# Patient Record
Sex: Female | Born: 1995 | Race: Black or African American | Hispanic: No | Marital: Single | Smoking: Never smoker
Health system: Southern US, Community
[De-identification: ages and names within clinical notes are randomized; demographics above are authoritative.]

## PROBLEM LIST (undated history)

## (undated) ENCOUNTER — Inpatient Hospital Stay (HOSPITAL_COMMUNITY): Payer: Self-pay

## (undated) DIAGNOSIS — O24419 Gestational diabetes mellitus in pregnancy, unspecified control: Secondary | ICD-10-CM

## (undated) DIAGNOSIS — R51 Headache: Secondary | ICD-10-CM

## (undated) DIAGNOSIS — O1410 Severe pre-eclampsia, unspecified trimester: Secondary | ICD-10-CM

## (undated) DIAGNOSIS — Z789 Other specified health status: Secondary | ICD-10-CM

## (undated) DIAGNOSIS — N390 Urinary tract infection, site not specified: Secondary | ICD-10-CM

## (undated) DIAGNOSIS — A539 Syphilis, unspecified: Secondary | ICD-10-CM

## (undated) DIAGNOSIS — R519 Headache, unspecified: Secondary | ICD-10-CM

## (undated) DIAGNOSIS — I10 Essential (primary) hypertension: Secondary | ICD-10-CM

## (undated) DIAGNOSIS — O139 Gestational [pregnancy-induced] hypertension without significant proteinuria, unspecified trimester: Secondary | ICD-10-CM

## (undated) HISTORY — DX: Severe pre-eclampsia, unspecified trimester: O14.10

## (undated) HISTORY — DX: Other specified health status: Z78.9

## (undated) HISTORY — PX: TONSILLECTOMY: SUR1361

---
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
## 2017-01-03 ENCOUNTER — Encounter (HOSPITAL_COMMUNITY): Payer: Self-pay | Admitting: *Deleted

## 2017-01-03 ENCOUNTER — Inpatient Hospital Stay (HOSPITAL_COMMUNITY)
Admission: AD | Admit: 2017-01-03 | Discharge: 2017-01-03 | Disposition: A | Discharge status: Home / Self Care | Payer: Medicaid Other | Source: Ambulatory Visit | Attending: Family Medicine | Admitting: Family Medicine

## 2017-01-03 DIAGNOSIS — O26891 Other specified pregnancy related conditions, first trimester: Secondary | ICD-10-CM | POA: Insufficient documentation

## 2017-01-03 DIAGNOSIS — O26899 Other specified pregnancy related conditions, unspecified trimester: Secondary | ICD-10-CM

## 2017-01-03 DIAGNOSIS — O26892 Other specified pregnancy related conditions, second trimester: Secondary | ICD-10-CM | POA: Diagnosis not present

## 2017-01-03 DIAGNOSIS — R102 Pelvic and perineal pain: Secondary | ICD-10-CM

## 2017-01-03 DIAGNOSIS — Z9889 Other specified postprocedural states: Secondary | ICD-10-CM | POA: Insufficient documentation

## 2017-01-03 DIAGNOSIS — R51 Headache: Secondary | ICD-10-CM | POA: Diagnosis not present

## 2017-01-03 DIAGNOSIS — Z79899 Other long term (current) drug therapy: Secondary | ICD-10-CM | POA: Insufficient documentation

## 2017-01-03 DIAGNOSIS — R1030 Lower abdominal pain, unspecified: Secondary | ICD-10-CM | POA: Diagnosis not present

## 2017-01-03 DIAGNOSIS — Z3491 Encounter for supervision of normal pregnancy, unspecified, first trimester: Secondary | ICD-10-CM

## 2017-01-03 DIAGNOSIS — Z3A13 13 weeks gestation of pregnancy: Secondary | ICD-10-CM | POA: Insufficient documentation

## 2017-01-03 LAB — URINALYSIS, ROUTINE W REFLEX MICROSCOPIC
BILIRUBIN URINE: NEGATIVE
GLUCOSE, UA: NEGATIVE mg/dL
Hgb urine dipstick: NEGATIVE
Ketones, ur: 20 mg/dL — AB
NITRITE: NEGATIVE
PH: 6 (ref 5.0–8.0)
Protein, ur: NEGATIVE mg/dL
SPECIFIC GRAVITY, URINE: 1.01 (ref 1.005–1.030)

## 2017-01-03 MED ORDER — TERCONAZOLE 0.8 % VA CREA: 1.0000 | TOPICAL_CREAM | Freq: Every day | VAGINAL | 0 refills | Status: DC

## 2017-01-03 NOTE — Discharge Instructions (Signed)

## 2017-01-03 NOTE — MAU Note (Signed)
Having migraines (off and on for a month) and sharp pains on LLQ.  Has been off and on for a wk.   +HPT about 2 months ago.

## 2017-01-03 NOTE — MAU Provider Note (Signed)
History     CSN: [id number]  Arrival date and time: 01/03/17 1329  First Provider Initiated Contact with Patient 01/03/17 1610      Chief Complaint  Patient presents with  . Headache  . Abdominal Pain   HPI Alexis Blanchard is a 21 y.o. G2P0010 at [redacted]w[redacted]d who presents with abdominal pain & headache. Has initial prenatal visit scheduled for next month.  Reports increase in headache since finding out she was pregnant. Current headache since this morning. Has not treated. Rates pain 2/10. States she normally takes tylenol for headaches & they resolve. Denies CP, SOB, n/v/d, or photophobia.  Lower abdominal pain x 1 week. Pain is bilateral and occurs with position changes only. Rates pain 2/10 when it occurs. Denies dysuria, vaginal bleeding, or vaginal discharge.  OB History    Gravida Para Term Preterm AB Living   2 0 0 0 1 0   SAB TAB Ectopic Multiple Live Births   1 0 0 0 0      History reviewed. No pertinent past medical history.  Past Surgical History:  Procedure Laterality Date  . TONSILLECTOMY      History reviewed. No pertinent family history.  Social History  Substance Use Topics  . Smoking status: Never Smoker  . Smokeless tobacco: Never Used  . Alcohol use Yes     Comment: socially    Allergies: No Known Allergies  Prescriptions Prior to Admission  Medication Sig Dispense Refill Last Dose  . Prenatal Vit-Fe Fumarate-FA (PRENATAL MULTIVITAMIN) TABS tablet Take 1 tablet by mouth daily at 12 noon.   08/21/2016 at Unknown time    Review of Systems  Constitutional: Negative.   Eyes: Negative for photophobia.  Respiratory: Negative for shortness of breath.   Cardiovascular: Negative for chest pain.  Gastrointestinal: Positive for abdominal pain. Negative for constipation, diarrhea, nausea and vomiting.  Genitourinary: Negative.   Neurological: Positive for headaches. Negative for dizziness, syncope and light-headedness.   Physical Exam   Blood pressure  120/79, pulse 88, temperature 98.9 F (37.2 C), temperature source Oral, resp. rate 16, weight 107 lb 4 oz (48.6 kg), last menstrual period 09/28/2016, SpO2 100 %, unknown if currently breastfeeding.  Physical Exam  Nursing note and vitals reviewed. Constitutional: She is oriented to person, place, and time. She appears well-developed and well-nourished. No distress.  HENT:  Head: Normocephalic and atraumatic.  Eyes: Conjunctivae are normal. Right eye exhibits no discharge. Left eye exhibits no discharge. No scleral icterus.  Neck: Normal range of motion.  Cardiovascular: Normal rate, regular rhythm and normal heart sounds.   No murmur heard. Respiratory: Effort normal and breath sounds normal. No respiratory distress. She has no wheezes.  GI: Soft. Bowel sounds are normal. There is no tenderness.  Genitourinary:  Genitourinary Comments: Cervix closed/thick  Neurological: She is alert and oriented to person, place, and time.  Skin: Skin is warm and dry. She is not diaphoretic.  Psychiatric: She has a normal mood and affect. Her behavior is normal. Judgment and thought content normal.    MAU Course  Procedures Results for orders placed or performed during the hospital encounter of 01/03/17 (from the past 24 hour(s))  Urinalysis, Routine w reflex microscopic     Status: Abnormal   Collection Time: 01/03/17  1:50 PM  Result Value Ref Range   Color, Urine YELLOW YELLOW   APPearance HAZY (A) CLEAR   Specific Gravity, Urine 1.010 1.005 - 1.030   pH 6.0 5.0 - 8.0   Glucose,  UA NEGATIVE NEGATIVE mg/dL   Hgb urine dipstick NEGATIVE NEGATIVE   Bilirubin Urine NEGATIVE NEGATIVE   Ketones, ur 20 (A) NEGATIVE mg/dL   Protein, ur NEGATIVE NEGATIVE mg/dL   Nitrite NEGATIVE NEGATIVE   Leukocytes, UA MODERATE (A) NEGATIVE   RBC / HPF 0-5 0 - 5 RBC/hpf   WBC, UA 6-30 0 - 5 WBC/hpf   Bacteria, UA RARE (A) NONE SEEN   Squamous Epithelial / LPF 6-30 (A) NONE SEEN   Mucus PRESENT    Budding  Yeast PRESENT     MDM FHT 165 VSS, NAD Cervix closed Will tx for yeast based on u/a micro Assessment and Plan  A: 1. Pain of round ligament affecting pregnancy, antepartum   2. Fetal heart tones present, first trimester    P: Discharge home Rx terazol Take tylenol prn headache Keep scheduled prenatal appt Discussed reasons to return to MAU  Judeth Horn 01/03/2017, 4:09 PM

## 2017-01-15 DIAGNOSIS — O0990 Supervision of high risk pregnancy, unspecified, unspecified trimester: Secondary | ICD-10-CM | POA: Insufficient documentation

## 2017-01-16 ENCOUNTER — Other Ambulatory Visit (HOSPITAL_COMMUNITY)
Admission: RE | Admit: 2017-01-16 | Discharge: 2017-01-16 | Disposition: A | Discharge status: Home / Self Care | Payer: Medicaid Other | Source: Ambulatory Visit | Attending: Obstetrics & Gynecology | Admitting: Obstetrics & Gynecology

## 2017-01-16 ENCOUNTER — Encounter: Payer: Self-pay | Admitting: Family Medicine

## 2017-01-16 ENCOUNTER — Ambulatory Visit (INDEPENDENT_AMBULATORY_CARE_PROVIDER_SITE_OTHER): Payer: Medicaid Other | Admitting: Family Medicine

## 2017-01-16 DIAGNOSIS — Z23 Encounter for immunization: Secondary | ICD-10-CM

## 2017-01-16 DIAGNOSIS — Z3481 Encounter for supervision of other normal pregnancy, first trimester: Secondary | ICD-10-CM

## 2017-01-16 DIAGNOSIS — Z3480 Encounter for supervision of other normal pregnancy, unspecified trimester: Secondary | ICD-10-CM | POA: Diagnosis present

## 2017-01-16 DIAGNOSIS — Z283 Underimmunization status: Secondary | ICD-10-CM

## 2017-01-16 DIAGNOSIS — O9989 Other specified diseases and conditions complicating pregnancy, childbirth and the puerperium: Secondary | ICD-10-CM

## 2017-01-16 DIAGNOSIS — Z2839 Other underimmunization status: Secondary | ICD-10-CM

## 2017-01-16 MED ORDER — CONCEPT OB 130-92.4-1 MG PO CAPS: 1.0000 | ORAL_CAPSULE | Freq: Every day | ORAL | 12 refills | Status: DC

## 2017-01-16 NOTE — Progress Notes (Signed)
    Subjective:    Alexis Blanchard is a G2P0010 8364w5d being seen today for her first obstetrical visit.  Her obstetrical history is not significant. Pregnancy history fully reviewed.  Patient reports no complaints.  Vitals:   01/16/17 1458  BP: 107/71  Pulse: 84  Weight: 107 lb (48.5 kg)    HISTORY: OB History  Gravida Para Term Preterm AB Living  2 0 0 0 1 0  SAB TAB Ectopic Multiple Live Births  1 0 0 0 0    # Outcome Date GA Lbr Len/2nd Weight Sex Delivery Anes PTL Lv  2 Current           1 SAB              Past Medical History:  Diagnosis Date  . Medical history non-contributory    Past Surgical History:  Procedure Laterality Date  . TONSILLECTOMY     Family History  Problem Relation Age of Onset  . Hypertension Mother   . Heart disease Maternal Aunt        CHF  . Diabetes Maternal Grandmother   . Hypertension Maternal Grandmother   . Heart disease Maternal Grandmother   . Cancer Maternal Aunt 45       colon  . Cancer Maternal Aunt 45       throat     Exam    Uterus:   13 wk size   Skin: normal coloration and turgor, no rashes    Neurologic: normal   Extremities: normal strength, tone, and muscle mass   HEENT extra ocular movement intact and sclera clear, anicteric   Mouth/Teeth mucous membranes moist, pharynx normal without lesions and dental hygiene good   Neck supple and no masses   Cardiovascular: regular rate and rhythm, no murmurs or gallops   Respiratory:  appears well, vitals normal, no respiratory distress, acyanotic, normal RR, ear and throat exam is normal, neck free of mass or lymphadenopathy, chest clear, no wheezing, crepitations, rhonchi, normal symmetric air entry   Abdomen: soft, non-tender; bowel sounds normal; no masses,  no organomegaly      Assessment:    Pregnancy: G2P0010 1. Supervision of other normal pregnancy, antepartum New OB labs Problem list update Baby Scripts Declines genetic screen for infant Order  anatomy u/s  - Culture, OB Urine - GC[facility]Chlamydia [facility] amp [facility] - Obstetric Panel, Including HIV - Flu Vaccine QUAD 36+ mos IM (Fluarix, Quad PF) - Prenat w/o A Vit-FeFum-FePo-FA (CONCEPT OB) 130-92.4-1 MG CAPS; Take 1 tablet by mouth daily.  Dispense: 30 capsule; Refill: 12 - Genetic Screening - Enroll Patient in Babyscripts - Babyscripts Schedule Optimization - US MFM OB COMP + 14 WK; Future  Return in 6 weeks (on 02/27/2017).  Reva Boresanya S Pratt 01/16/2017

## 2017-01-16 NOTE — Patient Instructions (Signed)
 Second Trimester of Pregnancy The second trimester is from week 14 through week 27 (months 4 through 6). The second trimester is often a time when you feel your best. Your body has adjusted to being pregnant, and you begin to feel better physically. Usually, morning sickness has lessened or quit completely, you may have more energy, and you may have an increase in appetite. The second trimester is also a time when the fetus is growing rapidly. At the end of the sixth month, the fetus is about 9 inches long and weighs about 1 pounds. You will likely begin to feel the baby move (quickening) between 16 and 20 weeks of pregnancy. Body changes during your second trimester Your body continues to go through many changes during your second trimester. The changes vary from woman to woman.  Your weight will continue to increase. You will notice your lower abdomen bulging out.  You may begin to get stretch marks on your hips, abdomen, and breasts.  You may develop headaches that can be relieved by medicines. The medicines should be approved by your health care provider.  You may urinate more often because the fetus is pressing on your bladder.  You may develop or continue to have heartburn as a result of your pregnancy.  You may develop constipation because certain hormones are causing the muscles that push waste through your intestines to slow down.  You may develop hemorrhoids or swollen, bulging veins (varicose veins).  You may have back pain. This is caused by: ? Weight gain. ? Pregnancy hormones that are relaxing the joints in your pelvis. ? A shift in weight and the muscles that support your balance.  Your breasts will continue to grow and they will continue to become tender.  Your gums may bleed and may be sensitive to brushing and flossing.  Dark spots or blotches (chloasma, mask of pregnancy) may develop on your face. This will likely fade after the baby is born.  A dark line from  your belly button to the pubic area (linea nigra) may appear. This will likely fade after the baby is born.  You may have changes in your hair. These can include thickening of your hair, rapid growth, and changes in texture. Some women also have hair loss during or after pregnancy, or hair that feels dry or thin. Your hair will most likely return to normal after your baby is born.  What to expect at prenatal visits During a routine prenatal visit:  You will be weighed to make sure you and the fetus are growing normally.  Your blood pressure will be taken.  Your abdomen will be measured to track your baby's growth.  The fetal heartbeat will be listened to.  Any test results from the previous visit will be discussed.  Your health care provider may ask you:  How you are feeling.  If you are feeling the baby move.  If you have had any abnormal symptoms, such as leaking fluid, bleeding, severe headaches, or abdominal cramping.  If you are using any tobacco products, including cigarettes, chewing tobacco, and electronic cigarettes.  If you have any questions.  Other tests that may be performed during your second trimester include:  Blood tests that check for: ? Low iron levels (anemia). ? High blood sugar that affects pregnant women (gestational diabetes) between 24 and 28 weeks. ? Rh antibodies. This is to check for a protein on red blood cells (Rh factor).  Urine tests to check for infections, diabetes,   or protein in the urine.  An ultrasound to confirm the proper growth and development of the baby.  An amniocentesis to check for possible genetic problems.  Fetal screens for spina bifida and Down syndrome.  HIV (human immunodeficiency virus) testing. Routine prenatal testing includes screening for HIV, unless you choose not to have this test.  Follow these instructions at home: Medicines  Follow your health care provider's instructions regarding medicine use. Specific  medicines may be either safe or unsafe to take during pregnancy.  Take a prenatal vitamin that contains at least 600 micrograms (mcg) of folic acid.  If you develop constipation, try taking a stool softener if your health care provider approves. Eating and drinking  Eat a balanced diet that includes fresh fruits and vegetables, whole grains, good sources of protein such as meat, eggs, or tofu, and low-fat dairy. Your health care provider will help you determine the amount of weight gain that is right for you.  Avoid raw meat and uncooked cheese. These carry germs that can cause birth defects in the baby.  If you have low calcium intake from food, talk to your health care provider about whether you should take a daily calcium supplement.  Limit foods that are high in fat and processed sugars, such as fried and sweet foods.  To prevent constipation: ? Drink enough fluid to keep your urine clear or pale yellow. ? Eat foods that are high in fiber, such as fresh fruits and vegetables, whole grains, and beans. Activity  Exercise only as directed by your health care provider. Most women can continue their usual exercise routine during pregnancy. Try to exercise for 30 minutes at least 5 days a week. Stop exercising if you experience uterine contractions.  Avoid heavy lifting, wear low heel shoes, and practice good posture.  A sexual relationship may be continued unless your health care provider directs you otherwise. Relieving pain and discomfort  Wear a good support bra to prevent discomfort from breast tenderness.  Take warm sitz baths to soothe any pain or discomfort caused by hemorrhoids. Use hemorrhoid cream if your health care provider approves.  Rest with your legs elevated if you have leg cramps or low back pain.  If you develop varicose veins, wear support hose. Elevate your feet for 15 minutes, 3-4 times a day. Limit salt in your diet. Prenatal Care  Write down your questions.  Take them to your prenatal visits.  Keep all your prenatal visits as told by your health care provider. This is important. Safety  Wear your seat belt at all times when driving.  Make a list of emergency phone numbers, including numbers for family, friends, the hospital, and police and fire departments. General instructions  Ask your health care provider for a referral to a local prenatal education class. Begin classes no later than the beginning of month 6 of your pregnancy.  Ask for help if you have counseling or nutritional needs during pregnancy. Your health care provider can offer advice or refer you to specialists for help with various needs.  Do not use hot tubs, steam rooms, or saunas.  Do not douche or use tampons or scented sanitary pads.  Do not cross your legs for long periods of time.  Avoid cat litter boxes and soil used by cats. These carry germs that can cause birth defects in the baby and possibly loss of the fetus by miscarriage or stillbirth.  Avoid all smoking, herbs, alcohol, and unprescribed drugs. Chemicals in these products   can affect the formation and growth of the baby.  Do not use any products that contain nicotine or tobacco, such as cigarettes and e-cigarettes. If you need help quitting, ask your health care provider.  Visit your dentist if you have not gone yet during your pregnancy. Use a soft toothbrush to brush your teeth and be gentle when you floss. Contact a health care provider if:  You have dizziness.  You have mild pelvic cramps, pelvic pressure, or nagging pain in the abdominal area.  You have persistent nausea, vomiting, or diarrhea.  You have a bad smelling vaginal discharge.  You have pain when you urinate. Get help right away if:  You have a fever.  You are leaking fluid from your vagina.  You have spotting or bleeding from your vagina.  You have severe abdominal cramping or pain.  You have rapid weight gain or weight  loss.  You have shortness of breath with chest pain.  You notice sudden or extreme swelling of your face, hands, ankles, feet, or legs.  You have not felt your baby move in over an hour.  You have severe headaches that do not go away when you take medicine.  You have vision changes. Summary  The second trimester is from week 14 through week 27 (months 4 through 6). It is also a time when the fetus is growing rapidly.  Your body goes through many changes during pregnancy. The changes vary from woman to woman.  Avoid all smoking, herbs, alcohol, and unprescribed drugs. These chemicals affect the formation and growth your baby.  Do not use any tobacco products, such as cigarettes, chewing tobacco, and e-cigarettes. If you need help quitting, ask your health care provider.  Contact your health care provider if you have any questions. Keep all prenatal visits as told by your health care provider. This is important. This information is not intended to replace advice given to you by your health care provider. Make sure you discuss any questions you have with your health care provider. Document Released: 04/19/2001 Document Revised: 10/01/2015 Document Reviewed: 06/26/2012 [employer] Interactive Patient Education  2017 [employer]   Breastfeeding Deciding to breastfeed is one of the best choices you can make for you and your baby. A change in hormones during pregnancy causes your breast tissue to grow and increases the number and size of your milk ducts. These hormones also allow proteins, sugars, and fats from your blood supply to make breast milk in your milk-producing glands. Hormones prevent breast milk from being released before your baby is born as well as prompt milk flow after birth. Once breastfeeding has begun, thoughts of your baby, as well as his or her sucking or crying, can stimulate the release of milk from your milk-producing glands. Benefits of breastfeeding For Your  Baby  Your first milk (colostrum) helps your baby's digestive system function better.  There are antibodies in your milk that help your baby fight off infections.  Your baby has a lower incidence of asthma, allergies, and sudden infant death syndrome.  The nutrients in breast milk are better for your baby than infant formulas and are designed uniquely for your baby's needs.  Breast milk improves your baby's brain development.  Your baby is less likely to develop other conditions, such as childhood obesity, asthma, or type 2 diabetes mellitus.  For You  Breastfeeding helps to create a very special bond between you and your baby.  Breastfeeding is convenient. Breast milk is always available at   the correct temperature and costs nothing.  Breastfeeding helps to burn calories and helps you lose the weight gained during pregnancy.  Breastfeeding makes your uterus contract to its prepregnancy size faster and slows bleeding (lochia) after you give birth.  Breastfeeding helps to lower your risk of developing type 2 diabetes mellitus, osteoporosis, and breast or ovarian cancer later in life.  Signs that your baby is hungry Early Signs of Hunger  Increased alertness or activity.  Stretching.  Movement of the head from side to side.  Movement of the head and opening of the mouth when the corner of the mouth or cheek is stroked (rooting).  Increased sucking sounds, smacking lips, cooing, sighing, or squeaking.  Hand-to-mouth movements.  Increased sucking of fingers or hands.  Late Signs of Hunger  Fussing.  Intermittent crying.  Extreme Signs of Hunger Signs of extreme hunger will require calming and consoling before your baby will be able to breastfeed successfully. Do not wait for the following signs of extreme hunger to occur before you initiate breastfeeding:  Restlessness.  A loud, strong cry.  Screaming.  Breastfeeding basics Breastfeeding Initiation  Find a  comfortable place to sit or lie down, with your neck and back well supported.  Place a pillow or rolled up blanket under your baby to bring him or her to the level of your breast (if you are seated). Nursing pillows are specially designed to help support your arms and your baby while you breastfeed.  Make sure that your baby's abdomen is facing your abdomen.  Gently massage your breast. With your fingertips, massage from your chest wall toward your nipple in a circular motion. This encourages milk flow. You may need to continue this action during the feeding if your milk flows slowly.  Support your breast with 4 fingers underneath and your thumb above your nipple. Make sure your fingers are well away from your nipple and your baby's mouth.  Stroke your baby's lips gently with your finger or nipple.  When your baby's mouth is open wide enough, quickly bring your baby to your breast, placing your entire nipple and as much of the colored area around your nipple (areola) as possible into your baby's mouth. ? More areola should be visible above your baby's upper lip than below the lower lip. ? Your baby's tongue should be between his or her lower gum and your breast.  Ensure that your baby's mouth is correctly positioned around your nipple (latched). Your baby's lips should create a seal on your breast and be turned out (everted).  It is common for your baby to suck about 2-3 minutes in order to start the flow of breast milk.  Latching Teaching your baby how to latch on to your breast properly is very important. An improper latch can cause nipple pain and decreased milk supply for you and poor weight gain in your baby. Also, if your baby is not latched onto your nipple properly, he or she may swallow some air during feeding. This can make your baby fussy. Burping your baby when you switch breasts during the feeding can help to get rid of the air. However, teaching your baby to latch on properly is  still the best way to prevent fussiness from swallowing air while breastfeeding. Signs that your baby has successfully latched on to your nipple:  Silent tugging or silent sucking, without causing you pain.  Swallowing heard between every 3-4 sucks.  Muscle movement above and in front of his or her   ears while sucking.  Signs that your baby has not successfully latched on to nipple:  Sucking sounds or smacking sounds from your baby while breastfeeding.  Nipple pain.  If you think your baby has not latched on correctly, slip your finger into the corner of your baby's mouth to break the suction and place it between your baby's gums. Attempt breastfeeding initiation again. Signs of Successful Breastfeeding Signs from your baby:  A gradual decrease in the number of sucks or complete cessation of sucking.  Falling asleep.  Relaxation of his or her body.  Retention of a small amount of milk in his or her mouth.  Letting go of your breast by himself or herself.  Signs from you:  Breasts that have increased in firmness, weight, and size 1-3 hours after feeding.  Breasts that are softer immediately after breastfeeding.  Increased milk volume, as well as a change in milk consistency and color by the fifth day of breastfeeding.  Nipples that are not sore, cracked, or bleeding.  Signs That Your Baby is Getting Enough Milk  Wetting at least 1-2 diapers during the first 24 hours after birth.  Wetting at least 5-6 diapers every 24 hours for the first week after birth. The urine should be clear or pale yellow by 5 days after birth.  Wetting 6-8 diapers every 24 hours as your baby continues to grow and develop.  At least 3 stools in a 24-hour period by age 5 days. The stool should be soft and yellow.  At least 3 stools in a 24-hour period by age 7 days. The stool should be seedy and yellow.  No loss of weight greater than 10% of birth weight during the first 3 days of age.  Average  weight gain of 4-7 ounces (113-198 g) per week after age 4 days.  Consistent daily weight gain by age 5 days, without weight loss after the age of 2 weeks.  After a feeding, your baby may spit up a small amount. This is common. Breastfeeding frequency and duration Frequent feeding will help you make more milk and can prevent sore nipples and breast engorgement. Breastfeed when you feel the need to reduce the fullness of your breasts or when your baby shows signs of hunger. This is called "breastfeeding on demand." Avoid introducing a pacifier to your baby while you are working to establish breastfeeding (the first 4-6 weeks after your baby is born). After this time you may choose to use a pacifier. Research has shown that pacifier use during the first year of a baby's life decreases the risk of sudden infant death syndrome (SIDS). Allow your baby to feed on each breast as long as he or she wants. Breastfeed until your baby is finished feeding. When your baby unlatches or falls asleep while feeding from the first breast, offer the second breast. Because newborns are often sleepy in the first few weeks of life, you may need to awaken your baby to get him or her to feed. Breastfeeding times will vary from baby to baby. However, the following rules can serve as a guide to help you ensure that your baby is properly fed:  Newborns (babies 4 weeks of age or younger) may breastfeed every 1-3 hours.  Newborns should not go longer than 3 hours during the day or 5 hours during the night without breastfeeding.  You should breastfeed your baby a minimum of 8 times in a 24-hour period until you begin to introduce solid foods to your   baby at around 6 months of age.  Breast milk pumping Pumping and storing breast milk allows you to ensure that your baby is exclusively fed your breast milk, even at times when you are unable to breastfeed. This is especially important if you are going back to work while you are still  breastfeeding or when you are not able to be present during feedings. Your lactation consultant can give you guidelines on how long it is safe to store breast milk. A breast pump is a machine that allows you to pump milk from your breast into a sterile bottle. The pumped breast milk can then be stored in a refrigerator or freezer. Some breast pumps are operated by hand, while others use electricity. Ask your lactation consultant which type will work best for you. Breast pumps can be purchased, but some hospitals and breastfeeding support groups lease breast pumps on a monthly basis. A lactation consultant can teach you how to hand express breast milk, if you prefer not to use a pump. Caring for your breasts while you breastfeed Nipples can become dry, cracked, and sore while breastfeeding. The following recommendations can help keep your breasts moisturized and healthy:  Avoid using soap on your nipples.  Wear a supportive bra. Although not required, special nursing bras and tank tops are designed to allow access to your breasts for breastfeeding without taking off your entire bra or top. Avoid wearing underwire-style bras or extremely tight bras.  Air dry your nipples for 3-4minutes after each feeding.  Use only cotton bra pads to absorb leaked breast milk. Leaking of breast milk between feedings is normal.  Use lanolin on your nipples after breastfeeding. Lanolin helps to maintain your skin's normal moisture barrier. If you use pure lanolin, you do not need to wash it off before feeding your baby again. Pure lanolin is not toxic to your baby. You may also hand express a few drops of breast milk and gently massage that milk into your nipples and allow the milk to air dry.  In the first few weeks after giving birth, some women experience extremely full breasts (engorgement). Engorgement can make your breasts feel heavy, warm, and tender to the touch. Engorgement peaks within 3-5 days after you give  birth. The following recommendations can help ease engorgement:  Completely empty your breasts while breastfeeding or pumping. You may want to start by applying warm, moist heat (in the shower or with warm water-soaked hand towels) just before feeding or pumping. This increases circulation and helps the milk flow. If your baby does not completely empty your breasts while breastfeeding, pump any extra milk after he or she is finished.  Wear a snug bra (nursing or regular) or tank top for 1-2 days to signal your body to slightly decrease milk production.  Apply ice packs to your breasts, unless this is too uncomfortable for you.  Make sure that your baby is latched on and positioned properly while breastfeeding.  If engorgement persists after 48 hours of following these recommendations, contact your health care provider or a lactation consultant. Overall health care recommendations while breastfeeding  Eat healthy foods. Alternate between meals and snacks, eating 3 of each per day. Because what you eat affects your breast milk, some of the foods may make your baby more irritable than usual. Avoid eating these foods if you are sure that they are negatively affecting your baby.  Drink milk, fruit juice, and water to satisfy your thirst (about 10 glasses a day).    Rest often, relax, and continue to take your prenatal vitamins to prevent fatigue, stress, and anemia.  Continue breast self-awareness checks.  Avoid chewing and smoking tobacco. Chemicals from cigarettes that pass into breast milk and exposure to secondhand smoke may harm your baby.  Avoid alcohol and drug use, including marijuana. Some medicines that may be harmful to your baby can pass through breast milk. It is important to ask your health care provider before taking any medicine, including all over-the-counter and prescription medicine as well as vitamin and herbal supplements. It is possible to become pregnant while breastfeeding.  If birth control is desired, ask your health care provider about options that will be safe for your baby. Contact a health care provider if:  You feel like you want to stop breastfeeding or have become frustrated with breastfeeding.  You have painful breasts or nipples.  Your nipples are cracked or bleeding.  Your breasts are red, tender, or warm.  You have a swollen area on either breast.  You have a fever or chills.  You have nausea or vomiting.  You have drainage other than breast milk from your nipples.  Your breasts do not become full before feedings by the fifth day after you give birth.  You feel sad and depressed.  Your baby is too sleepy to eat well.  Your baby is having trouble sleeping.  Your baby is wetting less than 3 diapers in a 24-hour period.  Your baby has less than 3 stools in a 24-hour period.  Your baby's skin or the white part of his or her eyes becomes yellow.  Your baby is not gaining weight by 5 days of age. Get help right away if:  Your baby is overly tired (lethargic) and does not want to wake up and feed.  Your baby develops an unexplained fever. This information is not intended to replace advice given to you by your health care provider. Make sure you discuss any questions you have with your health care provider. Document Released: 04/25/2005 Document Revised: 10/07/2015 Document Reviewed: 10/17/2012 [employer] Interactive Patient Education  2017 [employer]  

## 2017-01-17 DIAGNOSIS — O9989 Other specified diseases and conditions complicating pregnancy, childbirth and the puerperium: Secondary | ICD-10-CM

## 2017-01-17 DIAGNOSIS — Z283 Underimmunization status: Secondary | ICD-10-CM | POA: Insufficient documentation

## 2017-01-17 DIAGNOSIS — Z2839 Other underimmunization status: Secondary | ICD-10-CM | POA: Insufficient documentation

## 2017-01-17 LAB — OBSTETRIC PANEL, INCLUDING HIV
Antibody Screen: NEGATIVE
BASOS ABS: 0 10*3/uL (ref 0.0–0.2)
Basos: 0 %
EOS (ABSOLUTE): 0.3 10*3/uL (ref 0.0–0.4)
Eos: 4 %
HEP B S AG: NEGATIVE
HIV SCREEN 4TH GENERATION: NONREACTIVE
Hematocrit: 38 % (ref 34.0–46.6)
Hemoglobin: 12.3 g/dL (ref 11.1–15.9)
IMMATURE GRANULOCYTES: 0 %
Immature Grans (Abs): 0 10*3/uL (ref 0.0–0.1)
LYMPHS: 28 %
Lymphocytes Absolute: 2.2 10*3/uL (ref 0.7–3.1)
MCH: 25.7 pg — ABNORMAL LOW (ref 26.6–33.0)
MCHC: 32.4 g/dL (ref 31.5–35.7)
MCV: 79 fL (ref 79–97)
MONOCYTES: 5 %
Monocytes Absolute: 0.4 10*3/uL (ref 0.1–0.9)
NEUTROS ABS: 5 10*3/uL (ref 1.4–7.0)
NEUTROS PCT: 63 %
PLATELETS: 350 10*3/uL (ref 150–379)
RBC: 4.79 x10E6/uL (ref 3.77–5.28)
RDW: 17.2 % — AB (ref 12.3–15.4)
RPR: NONREACTIVE
Rh Factor: POSITIVE
WBC: 8 10*3/uL (ref 3.4–10.8)

## 2017-01-17 LAB — GC/CHLAMYDIA PROBE AMP (~~LOC~~) NOT AT ARMC
CHLAMYDIA, DNA PROBE: NEGATIVE
NEISSERIA GONORRHEA: NEGATIVE

## 2017-01-23 ENCOUNTER — Encounter (INDEPENDENT_AMBULATORY_CARE_PROVIDER_SITE_OTHER): Payer: Medicaid Other | Admitting: *Deleted

## 2017-01-23 DIAGNOSIS — Z3482 Encounter for supervision of other normal pregnancy, second trimester: Secondary | ICD-10-CM | POA: Diagnosis not present

## 2017-01-31 ENCOUNTER — Encounter: Payer: Self-pay | Admitting: Family Medicine

## 2017-01-31 DIAGNOSIS — D563 Thalassemia minor: Secondary | ICD-10-CM

## 2017-01-31 HISTORY — DX: Thalassemia minor: D56.3

## 2017-02-20 ENCOUNTER — Encounter (HOSPITAL_COMMUNITY): Payer: Self-pay | Admitting: Family Medicine

## 2017-02-27 ENCOUNTER — Telehealth: Payer: Self-pay | Admitting: *Deleted

## 2017-02-27 ENCOUNTER — Other Ambulatory Visit: Payer: Self-pay | Admitting: Family Medicine

## 2017-02-27 ENCOUNTER — Ambulatory Visit (HOSPITAL_COMMUNITY)
Admission: RE | Admit: 2017-02-27 | Discharge: 2017-02-27 | Disposition: A | Discharge status: Home / Self Care | Payer: Medicaid Other | Source: Ambulatory Visit | Attending: Family Medicine | Admitting: Family Medicine

## 2017-02-27 DIAGNOSIS — Z3689 Encounter for other specified antenatal screening: Secondary | ICD-10-CM

## 2017-02-27 DIAGNOSIS — Z3480 Encounter for supervision of other normal pregnancy, unspecified trimester: Secondary | ICD-10-CM

## 2017-02-27 NOTE — Telephone Encounter (Signed)
Called pt to remind her to take BP with BRX equipment - VM not set up - unable to leave a message.

## 2017-03-06 ENCOUNTER — Ambulatory Visit (INDEPENDENT_AMBULATORY_CARE_PROVIDER_SITE_OTHER): Payer: Medicaid Other | Admitting: Obstetrics and Gynecology

## 2017-03-06 VITALS — BP 134/71 | HR 94 | Wt 111.0 lb

## 2017-03-06 DIAGNOSIS — D563 Thalassemia minor: Secondary | ICD-10-CM

## 2017-03-06 DIAGNOSIS — Z3480 Encounter for supervision of other normal pregnancy, unspecified trimester: Secondary | ICD-10-CM

## 2017-03-06 DIAGNOSIS — Z3482 Encounter for supervision of other normal pregnancy, second trimester: Secondary | ICD-10-CM

## 2017-03-06 NOTE — Progress Notes (Signed)
Prenatal Visit Note Date: 03/06/2017 Clinic[facility]  Subjective:  Alexis Blanchard is a 21 y.o. G2P0010 at 6459w5d being seen today for ongoing prenatal care.  She is currently monitored for the following issues for this low-risk pregnancy and has Supervision of other normal pregnancy, antepartum; Rubella non-immune status, antepartum; and Alpha thalassemia silent carrier on her problem list.  Patient reports no complaints.   Contractions: Not present. Vag. Bleeding: None.  Movement: Present. Denies leaking of fluid.   The following portions of the patient's history were reviewed and updated as appropriate: allergies, current medications, past family history, past medical history, past social history, past surgical history and problem list. Problem list updated.  Objective:   Vitals:   03/06/17 1417  BP: 134/71  Pulse: 94  Weight: 111 lb (50.3 kg)    Fetal Status: Fetal Heart Rate (bpm): 145   Movement: Present     General:  Alert, oriented and cooperative. Patient is in no acute distress.  Skin: Skin is warm and dry. No rash noted.   Cardiovascular: Normal heart rate noted  Respiratory: Normal respiratory effort, no problems with respiration noted  Abdomen: Soft, gravid, appropriate for gestational age. Pain/Pressure: Absent     Pelvic:  Cervical exam deferred        Extremities: Normal range of motion.  Edema: None  Mental Status: Normal mood and affect. Normal behavior. Normal judgment and thought content.   Urinalysis:      Assessment and Plan:  Pregnancy: G2P0010 at 3459w5d  1. Supervision of other normal pregnancy, antepartum Not compliant with babyscripts so removed. Anatomy u/s negative.   Preterm labor symptoms and general obstetric precautions including but not limited to vaginal bleeding, contractions, leaking of fluid and fetal movement were reviewed in detail with the patient. Please refer to After Visit Summary for other  counseling recommendations.  Return in about 4 weeks (around 04/03/2017) for rob.   [place] BingPickens, Alexis Blakeney, MD

## 2017-03-15 ENCOUNTER — Inpatient Hospital Stay (HOSPITAL_COMMUNITY): Payer: Medicaid Other

## 2017-03-15 ENCOUNTER — Encounter (HOSPITAL_COMMUNITY): Payer: Self-pay | Admitting: Family Medicine

## 2017-03-15 ENCOUNTER — Other Ambulatory Visit: Payer: Self-pay

## 2017-03-15 ENCOUNTER — Inpatient Hospital Stay (HOSPITAL_COMMUNITY)
Admission: AD | Admit: 2017-03-15 | Discharge: 2017-03-15 | Disposition: A | Discharge status: Home / Self Care | Payer: Medicaid Other | Source: Ambulatory Visit | Attending: Obstetrics and Gynecology | Admitting: Obstetrics and Gynecology

## 2017-03-15 DIAGNOSIS — O9989 Other specified diseases and conditions complicating pregnancy, childbirth and the puerperium: Secondary | ICD-10-CM

## 2017-03-15 DIAGNOSIS — Z3A22 22 weeks gestation of pregnancy: Secondary | ICD-10-CM | POA: Insufficient documentation

## 2017-03-15 DIAGNOSIS — O26892 Other specified pregnancy related conditions, second trimester: Secondary | ICD-10-CM | POA: Insufficient documentation

## 2017-03-15 DIAGNOSIS — R109 Unspecified abdominal pain: Secondary | ICD-10-CM

## 2017-03-15 LAB — COMPREHENSIVE METABOLIC PANEL
ALBUMIN: 2.8 g/dL — AB (ref 3.5–5.0)
ALK PHOS: 59 U/L (ref 38–126)
ALT: 11 U/L — ABNORMAL LOW (ref 14–54)
ANION GAP: 6 (ref 5–15)
AST: 16 U/L (ref 15–41)
BILIRUBIN TOTAL: 0.2 mg/dL — AB (ref 0.3–1.2)
BUN: 7 mg/dL (ref 6–20)
CALCIUM: 8.4 mg/dL — AB (ref 8.9–10.3)
CO2: 24 mmol/L (ref 22–32)
Chloride: 106 mmol/L (ref 101–111)
Creatinine, Ser: 0.52 mg/dL (ref 0.44–1.00)
GFR calc non Af Amer: >60 mL/min (ref >60)
Glucose, Bld: 86 mg/dL (ref 65–99)
POTASSIUM: 3.7 mmol/L (ref 3.5–5.1)
SODIUM: 136 mmol/L (ref 135–145)
TOTAL PROTEIN: 6 g/dL — AB (ref 6.5–8.1)

## 2017-03-15 LAB — CBC
HCT: 32.5 % — ABNORMAL LOW (ref 36.0–46.0)
HEMOGLOBIN: 11.1 g/dL — AB (ref 12.0–15.0)
MCH: 26.8 pg (ref 26.0–34.0)
MCHC: 34.2 g/dL (ref 30.0–36.0)
MCV: 78.5 fL (ref 78.0–100.0)
PLATELETS: 328 10*3/uL (ref 150–400)
RBC: 4.14 MIL/uL (ref 3.87–5.11)
RDW: 14.6 % (ref 11.5–15.5)
WBC: 12.5 10*3/uL — AB (ref 4.0–10.5)

## 2017-03-15 LAB — URINALYSIS, ROUTINE W REFLEX MICROSCOPIC
BILIRUBIN URINE: NEGATIVE
Glucose, UA: NEGATIVE mg/dL
Hgb urine dipstick: NEGATIVE
Ketones, ur: NEGATIVE mg/dL
Nitrite: NEGATIVE
Protein, ur: NEGATIVE mg/dL
SPECIFIC GRAVITY, URINE: 1.01 (ref 1.005–1.030)
pH: 7 (ref 5.0–8.0)

## 2017-03-15 LAB — DIFFERENTIAL
BASOS PCT: 0 %
Basophils Absolute: 0 10*3/uL (ref 0.0–0.1)
Eosinophils Absolute: 0.1 10*3/uL (ref 0.0–0.7)
Eosinophils Relative: 1 %
LYMPHS PCT: 22 %
Lymphs Abs: 2.7 10*3/uL (ref 0.7–4.0)
Monocytes Absolute: 0.6 10*3/uL (ref 0.1–1.0)
Monocytes Relative: 5 %
NEUTROS ABS: 9.2 10*3/uL — AB (ref 1.7–7.7)
Neutrophils Relative %: 72 %

## 2017-03-15 MED ORDER — LACTATED RINGERS IV BOLUS (SEPSIS): 1000.0000 mL | Freq: Once | INTRAVENOUS | Status: DC

## 2017-03-15 NOTE — MAU Provider Note (Signed)
History  CSN: [id number] Arrival date and time: 03/15/17 2045  First Provider Initiated Contact with Patient 03/15/17 2116      Chief Complaint  Patient presents with  . Abdominal Pain  . Back Pain    HPI: Alexis Blanchard is a 21 y.o. G2P0010 with IUP at [redacted]w[redacted]d who presents to maternity admissions reporting acute onset R flank pain which started less than an hour ago. Reports constant dull pain, but intermittently sharp, with some radiation across her right lower abdomen. She has not taken anything for pain. She denies dysuria or hematuria, but does endorses some urinary frequency that just started today. She denies any nausea, but had an episode of emesis after arrival here; states this just came upon her all of the sudden. Denies fever, chills, diarrhea, vaginal discharge, vaginal bleeding, LOF, or contractions. Reports good fetal movement.   She receives [facility] . Pregnancy complicated by alpha thalassemia carrier and RNI.  Past obstetric history: OB History  Gravida Para Term Preterm AB Living  2 0 0 0 1 0  SAB TAB Ectopic Multiple Live Births  1 0 0 0 0    # Outcome Date GA Lbr Len/2nd Weight Sex Delivery Anes PTL Lv  2 Current           1 SAB               Past Medical History:  Diagnosis Date  . Medical history non-contributory     Past Surgical History:  Procedure Laterality Date  . TONSILLECTOMY      Family History  Problem Relation Age of Onset  . Hypertension Mother   . Heart disease Maternal Aunt        CHF  . Diabetes Maternal Grandmother   . Hypertension Maternal Grandmother   . Heart disease Maternal Grandmother   . Cancer Maternal Aunt 45       colon  . Cancer Maternal Aunt 45       throat    Social History   Tobacco Use  . Smoking status: Never Smoker  . Smokeless tobacco: Never Used  Substance Use Topics  . Alcohol use: Yes    Comment: socially  . Drug use: Yes    Types: Marijuana    Allergies: No Known  Allergies  Medications Prior to Admission  Medication Sig Dispense Refill Last Dose  . Prenat w/o A Vit-FeFum-FePo-FA (CONCEPT OB) 130-92.4-1 MG CAPS Take 1 tablet by mouth daily. 30 capsule 12 Taking    Review of Systems - Negative except for what is mentioned in HPI.  Physical Exam   Blood pressure 134/80, pulse (!) 107, temperature 97.7 F (36.5 C), temperature source Oral, resp. rate 18, height 5\' 1"  (1.549 m), weight 119 lb (54 kg), last menstrual period 09/28/2016, SpO2 100 %, unknown if currently breastfeeding.  Constitutional: Well-developed, well-nourished female, sitting on the side of the bed, not distressed, but appears to be in pain  HENT: [place]/AT, normal oropharynx mucosa. MMM Eyes: normal conjunctivae, no scleral icterus Cardiovascular: normal rate, regular rhythm Respiratory: normal effort, lungs CTAB.  GI: Abd soft, non-tender on all 4 quadrants; gravid appropriate for gestational age.   GU: +R CVAT. MSK: Extremities nontender, no edema Neurologic: Alert and oriented x 4. Psych: Normal mood and affect Skin: warm and dry   MAU Course/MDM:   Nursing notes and VS reviewed. Patient seen and examined, as noted above. She appear to be in pain, and had an episode of  emesis, although she declined any meds. Afebrile, VS wnl.  UA moderate leuk, no Hgb, no nitrites, but clinical suspicion of nephrolithiasis. Start IVFs.  Check CBC, CMP, and rental U/S. Send urine for culture.   Results reviewed: Results for orders placed or performed during the hospital encounter of 03/15/17  Urinalysis, Routine w reflex microscopic  Result Value Ref Range   Color, Urine YELLOW YELLOW   APPearance CLOUDY (A) CLEAR   Specific Gravity, Urine 1.010 1.005 - 1.030   pH 7.0 5.0 - 8.0   Glucose, UA NEGATIVE NEGATIVE mg/dL   Hgb urine dipstick NEGATIVE NEGATIVE   Bilirubin Urine NEGATIVE NEGATIVE   Ketones, ur NEGATIVE NEGATIVE mg/dL   Protein, ur NEGATIVE NEGATIVE mg/dL   Nitrite NEGATIVE  NEGATIVE   Leukocytes, UA MODERATE (A) NEGATIVE   RBC / HPF 0-5 0 - 5 RBC/hpf   WBC, UA 6-30 0 - 5 WBC/hpf   Bacteria, UA RARE (A) NONE SEEN   Squamous Epithelial / LPF 0-5 (A) NONE SEEN   Mucus PRESENT   CBC  Result Value Ref Range   WBC 12.5 (H) 4.0 - 10.5 K/uL   RBC 4.14 3.87 - 5.11 MIL/uL   Hemoglobin 11.1 (L) 12.0 - 15.0 g/dL   HCT 24.432.5 (L) 01.036.0 - 27.246.0 %   MCV 78.5 78.0 - 100.0 fL   MCH 26.8 26.0 - 34.0 pg   MCHC 34.2 30.0 - 36.0 g/dL   RDW 53.614.6 64.411.5 - 03.415.5 %   Platelets 328 150 - 400 K/uL  Comprehensive metabolic panel  Result Value Ref Range   Sodium 136 135 - 145 mmol/L   Potassium 3.7 3.5 - 5.1 mmol/L   Chloride 106 101 - 111 mmol/L   CO2 24 22 - 32 mmol/L   Glucose, Bld 86 65 - 99 mg/dL   BUN 7 6 - 20 mg/dL   Creatinine, Ser 7.420.52 0.44 - 1.00 mg/dL   Calcium 8.4 (L) 8.9 - 10.3 mg/dL   Total Protein 6.0 (L) 6.5 - 8.1 g/dL   Albumin 2.8 (L) 3.5 - 5.0 g/dL   AST 16 15 - 41 U/L   ALT 11 (L) 14 - 54 U/L   Alkaline Phosphatase 59 38 - 126 U/L   Total Bilirubin 0.2 (L) 0.3 - 1.2 mg/dL   GFR calc non Af Amer >60 >60 mL/min   GFR calc Af Amer >60 >60 mL/min   Anion gap 6 5 - 15  Differential  Result Value Ref Range   Neutrophils Relative % 72 %   Neutro Abs 9.2 (H) 1.7 - 7.7 K/uL   Lymphocytes Relative 22 %   Lymphs Abs 2.7 0.7 - 4.0 K/uL   Monocytes Relative 5 %   Monocytes Absolute 0.6 0.1 - 1.0 K/uL   Eosinophils Relative 1 %   Eosinophils Absolute 0.1 0.0 - 0.7 K/uL   Basophils Relative 0 %   Basophils Absolute 0.0 0.0 - 0.1 K/uL   US RENAL CLINICAL DATA:  Acute right flank pain, [redacted] weeks pregnant  EXAM: RENAL / URINARY TRACT ULTRASOUND COMPLETE  COMPARISON:  None.  FINDINGS: Right Kidney:  Length: 10.5 cm. Echogenicity within normal limits. No mass. Mild moderate hydronephrosis.  Left Kidney:  Length: 10.2 cm. Echogenicity within normal limits. No mass or hydronephrosis visualized.  Bladder:  Appears normal for degree of bladder  distention.  IMPRESSION: 1. Mild to moderate right hydronephrosis 2. The kidneys are otherwise normal  Electronically Signed   By: Jasmine PangKim  Fujinaga M.D.   On:  03/15/2017 22:33  Discussed results with patient and ddx. Unclear etiology. Her pain has improved here some. She remains afebrile and VSS.  Discussed with Dr. Emelda FearFerguson. Will d/c with precautions.   Assessment and Plan  Assessment: 1. Acute right flank pain     Plan: --Discharge home in stable condition.  --Discussed pain management at home if persistent pain --Dicussed return precautions at length with patient and mother. Return to MAU if fever, intractable vomiting, worsening, or lethargy.    Latiesha Harada, Kandra NicolasJulie P, MD 03/15/2017 11:39 PM

## 2017-03-15 NOTE — MAU Note (Signed)
Right low back pain like flank area to low right abd, started 30 min ago. No bleeding. No urinary problems. No leaking. Baby moving well.

## 2017-03-15 NOTE — Discharge Instructions (Signed)
--  Drink plenty of fluids over the next 48 hours (at least 80oz of water a day) --You may take Tylenol (acetominophen) 1,000 mg (2 extra strength; or 3 regular strength) very 6-8 hours. --Return to MAU if you have fever, intractable vomiting, or worsening pain.

## 2017-03-18 LAB — CULTURE, OB URINE

## 2017-03-19 ENCOUNTER — Telehealth: Payer: Self-pay | Admitting: Advanced Practice Midwife

## 2017-03-19 DIAGNOSIS — O2342 Unspecified infection of urinary tract in pregnancy, second trimester: Secondary | ICD-10-CM

## 2017-03-19 DIAGNOSIS — N390 Urinary tract infection, site not specified: Secondary | ICD-10-CM

## 2017-03-19 MED ORDER — SULFAMETHOXAZOLE-TRIMETHOPRIM 800-160 MG PO TABS: 1.0000 | ORAL_TABLET | Freq: Two times a day (BID) | ORAL | 0 refills | Status: DC

## 2017-03-19 NOTE — Telephone Encounter (Signed)
Called to inform pt or urine culture results. VM not set up. Called mother's number. Pt not with her. Will call back later today and route to Clinical Pool to call again. Pt had presented to MAU 03/15/17 w/ right flank pain, N/V, urinary frequency, pyuria, mild leukocytosis (12.5) on 03/15/17. No fever, chills. No kidney stone visualized on US. Mild-mod rioght hydronephrosis thought to be physiologic for pregnancy. Urine culture 80K Strep Saprophyticus. Will Tx for complicated UTI w/ bactrim DS x 10 days per Up-To-Date which will provide pyelo coverage. Rx sent to pharmacy.   Katrinka BlazingSmith, [place], CNM 03/19/2017 9:52 AM

## 2017-03-19 NOTE — Telephone Encounter (Signed)
Pt called back for urine culture results. Still having right flank pain, but a little less than before. No fever, chills, N/V, dysuria, hematuria. Discussed Rx Bactrim x 10 days. May use IBU x 2 days for back pain. Do not use after [redacted] weeks gestation. Pyelo precautions reviewed.   Katrinka BlazingSmith, [place], CNM 03/19/2017 10:19 AM

## 2017-03-20 NOTE — Telephone Encounter (Signed)
I called pt and she states [facility] and she is aware meds were sent to pharmacy.

## 2017-04-03 ENCOUNTER — Ambulatory Visit (INDEPENDENT_AMBULATORY_CARE_PROVIDER_SITE_OTHER): Payer: Medicaid Other | Admitting: Obstetrics and Gynecology

## 2017-04-03 VITALS — BP 132/89 | HR 111 | Wt 109.2 lb

## 2017-04-03 DIAGNOSIS — O2612 Low weight gain in pregnancy, second trimester: Secondary | ICD-10-CM

## 2017-04-03 DIAGNOSIS — Z3480 Encounter for supervision of other normal pregnancy, unspecified trimester: Secondary | ICD-10-CM

## 2017-04-03 DIAGNOSIS — O2342 Unspecified infection of urinary tract in pregnancy, second trimester: Secondary | ICD-10-CM | POA: Insufficient documentation

## 2017-04-03 DIAGNOSIS — R030 Elevated blood-pressure reading, without diagnosis of hypertension: Secondary | ICD-10-CM

## 2017-04-03 MED ORDER — ASPIRIN EC 81 MG PO TBEC: 81.0000 mg | DELAYED_RELEASE_TABLET | Freq: Every day | ORAL | 3 refills | Status: DC

## 2017-04-03 NOTE — Progress Notes (Signed)
Prenatal Visit Note Date: 04/03/2017 Clinic: [facility]  Subjective:  Alexis Blanchard is a 21 y.o. G2P0010 at 2856w5d being seen today for ongoing prenatal care.  She is currently monitored for the following issues for this low-risk pregnancy and has Supervision of other normal pregnancy, antepartum; Rubella non-immune status, antepartum; Alpha thalassemia silent carrier; Transient hypertension; UTI in pregnancy, antepartum, second trimester; and Low weight gain during pregnancy in second trimester on their problem list.  Patient reports no complaints.   Contractions: Not present.  .  Movement: Present. Denies leaking of fluid.   The following portions of the patient's history were reviewed and updated as appropriate: allergies, current medications, past family history, past medical history, past social history, past surgical history and problem list. Problem list updated.  Objective:   Vitals:   04/03/17 1348 04/03/17 1355  BP: (!) 150/83 132/89  Pulse: (!) 102 (!) 111  Weight: 109 lb 3.2 oz (49.5 kg)     Fetal Status: Fetal Heart Rate (bpm): 150   Movement: Present     General:  Alert, oriented and cooperative. Patient is in no acute distress.  Skin: Skin is warm and dry. No rash noted.   Cardiovascular: Normal heart rate noted  Respiratory: Normal respiratory effort, no problems with respiration noted  Abdomen: Soft, gravid, appropriate for gestational age. Pain/Pressure: Absent     Pelvic:  Cervical exam deferred        Extremities: Normal range of motion.  Edema: None  Mental Status: Normal mood and affect. Normal behavior. Normal judgment and thought content.   Urinalysis:      Assessment and Plan:  Pregnancy: G2P0010 at 6856w5d  1. Supervision of other normal pregnancy, antepartum Routine care. 28wk labs nv  2. Transient hypertension Pre-x precautions given. 1wk bp check. Start low dose asa  3. UTI in pregnancy, antepartum, second trimester toc  mid december  4. Low weight gain during pregnancy in second trimester Normal FH today. D/w her that if not gaining weight well at her 28wk visit, I'd strongly consider doing a growth u/s to ensure good growth. I told her to shoot for 140lbs for end weight and try for about a 1lb a week and if having trouble with that to do food diary and can see her for visit here or refer her to RD  Preterm labor symptoms and general obstetric precautions including but not limited to vaginal bleeding, contractions, leaking of fluid and fetal movement were reviewed in detail with the patient. Please refer to After Visit Summary for other counseling recommendations.  Return in about 1 week (around 04/10/2017) for bp check. 3wk rob and 2hr GTT visit.   [place] BingPickens, Lene Mckay, MD

## 2017-04-24 ENCOUNTER — Ambulatory Visit (INDEPENDENT_AMBULATORY_CARE_PROVIDER_SITE_OTHER): Payer: Medicaid Other | Admitting: Obstetrics and Gynecology

## 2017-04-24 VITALS — BP 129/88 | HR 100 | Wt 115.0 lb

## 2017-04-24 DIAGNOSIS — Z3403 Encounter for supervision of normal first pregnancy, third trimester: Secondary | ICD-10-CM

## 2017-04-24 DIAGNOSIS — O2342 Unspecified infection of urinary tract in pregnancy, second trimester: Secondary | ICD-10-CM

## 2017-04-24 DIAGNOSIS — Z3480 Encounter for supervision of other normal pregnancy, unspecified trimester: Secondary | ICD-10-CM

## 2017-04-24 NOTE — Progress Notes (Signed)
Prenatal Visit Note Date: 04/24/2017 Clinic: [facility]  Subjective:  Alexis Blanchard is a 21 y.o. G2P0010 at 3386w5d being seen today for ongoing prenatal care.  She is currently monitored for the following issues for this low-risk pregnancy and has Supervision of other normal pregnancy, antepartum; Rubella non-immune status, antepartum; Alpha thalassemia silent carrier; Transient hypertension; UTI in pregnancy, antepartum, second trimester; and Low weight gain during pregnancy in second trimester on their problem list.  Patient reports no complaints.   Contractions: Not present. Vag. Bleeding: None.  Movement: Present. Denies leaking of fluid.   The following portions of the patient's history were reviewed and updated as appropriate: allergies, current medications, past family history, past medical history, past social history, past surgical history and problem list. Problem list updated.  Objective:   Vitals:   04/24/17 1343  BP: 129/88  Pulse: 100  Weight: 115 lb (52.2 kg)    Fetal Status: Fetal Heart Rate (bpm): 146(Simultaneous filing. User may not have seen previous data.) Fundal Height: 28 cm Movement: Present     General:  Alert, oriented and cooperative. Patient is in no acute distress.  Skin: Skin is warm and dry. No rash noted.   Cardiovascular: Normal heart rate noted  Respiratory: Normal respiratory effort, no problems with respiration noted  Abdomen: Soft, gravid, appropriate for gestational age. Pain/Pressure: Absent     Pelvic:  Cervical exam deferred        Extremities: Normal range of motion.  Edema: None  Mental Status: Normal mood and affect. Normal behavior. Normal judgment and thought content.   Urinalysis:      Assessment and Plan:  Pregnancy: G2P0010 at 4186w5d  1. Encounter for supervision of normal first pregnancy in third trimester Routine care. Good weight gain and normal FHs. Encouraged to shoot for about a 1-1.5lbs/week gain -  RPR - Glucose Tolerance, 2 Hours w/1 Hour - HIV antibody - CBC - Culture, OB Urine  2. UTI in pregnancy, antepartum, second trimester toc  - Culture, OB Urine  Preterm labor symptoms and general obstetric precautions including but not limited to vaginal bleeding, contractions, leaking of fluid and fetal movement were reviewed in detail with the patient. Please refer to After Visit Summary for other counseling recommendations.  Return in about 2 weeks (around 05/08/2017).   [place] BingPickens, Camy Leder, MD

## 2017-04-25 ENCOUNTER — Other Ambulatory Visit: Payer: Self-pay

## 2017-04-25 DIAGNOSIS — O24419 Gestational diabetes mellitus in pregnancy, unspecified control: Secondary | ICD-10-CM

## 2017-04-25 LAB — CBC
HEMATOCRIT: 31.7 % — AB (ref 34.0–46.6)
Hemoglobin: 10.1 g/dL — ABNORMAL LOW (ref 11.1–15.9)
MCH: 25.4 pg — AB (ref 26.6–33.0)
MCHC: 31.9 g/dL (ref 31.5–35.7)
MCV: 80 fL (ref 79–97)
PLATELETS: 347 10*3/uL (ref 150–379)
RBC: 3.97 x10E6/uL (ref 3.77–5.28)
RDW: 14.8 % (ref 12.3–15.4)
WBC: 8.5 10*3/uL (ref 3.4–10.8)

## 2017-04-25 LAB — GLUCOSE TOLERANCE, 2 HOURS W/ 1HR
GLUCOSE, 1 HOUR: 174 mg/dL (ref 65–179)
GLUCOSE, FASTING: 71 mg/dL (ref 65–91)
Glucose, 2 hour: 181 mg/dL — ABNORMAL HIGH (ref 65–152)

## 2017-04-25 LAB — RPR: RPR: NONREACTIVE

## 2017-04-25 LAB — HIV ANTIBODY (ROUTINE TESTING W REFLEX): HIV Screen 4th Generation wRfx: NONREACTIVE

## 2017-04-25 MED ORDER — ACCU-CHEK FASTCLIX LANCETS MISC: 12 refills | Status: DC

## 2017-04-25 MED ORDER — GLUCOSE BLOOD VI STRP: ORAL_STRIP | 12 refills | Status: DC

## 2017-04-25 MED ORDER — ACCU-CHEK AVIVA PLUS W/DEVICE KIT: 1.0000 | PACK | Freq: Once | 0 refills | Status: AC

## 2017-04-25 NOTE — Telephone Encounter (Signed)
Called patient to advised her of positive GDM results. I have left a detail message for her to call us back regarding test results. Meter has been called into patient pharmacy.

## 2017-04-27 ENCOUNTER — Encounter: Payer: Self-pay | Admitting: Obstetrics and Gynecology

## 2017-04-27 DIAGNOSIS — O24419 Gestational diabetes mellitus in pregnancy, unspecified control: Secondary | ICD-10-CM | POA: Insufficient documentation

## 2017-04-29 LAB — CULTURE, OB URINE

## 2017-04-29 LAB — URINE CULTURE, OB REFLEX

## 2017-05-06 ENCOUNTER — Telehealth: Payer: Self-pay | Admitting: Obstetrics and Gynecology

## 2017-05-06 ENCOUNTER — Encounter: Payer: Self-pay | Admitting: Obstetrics and Gynecology

## 2017-05-06 DIAGNOSIS — R8271 Bacteriuria: Secondary | ICD-10-CM | POA: Insufficient documentation

## 2017-05-06 MED ORDER — NITROFURANTOIN MONOHYD MACRO 100 MG PO CAPS: 100.0000 mg | ORAL_CAPSULE | Freq: Two times a day (BID) | ORAL | 0 refills | Status: DC

## 2017-05-06 NOTE — Telephone Encounter (Signed)
OB Telephone Note Patient called at [phone] but VM not set up yet. Pt called at [phone] and VM let for Ms. Pippins to call [phone]. Will need to d/w her re: +GBS urine. macrobid sent in. toc early feb  Emmagene Ortner, Jr MD Attending [facility] 05/06/2017 Time: 1224pm

## 2017-05-10 ENCOUNTER — Ambulatory Visit (INDEPENDENT_AMBULATORY_CARE_PROVIDER_SITE_OTHER): Payer: Medicaid Other | Admitting: Student

## 2017-05-10 VITALS — BP 145/104

## 2017-05-10 DIAGNOSIS — O133 Gestational [pregnancy-induced] hypertension without significant proteinuria, third trimester: Secondary | ICD-10-CM

## 2017-05-10 DIAGNOSIS — R8271 Bacteriuria: Secondary | ICD-10-CM

## 2017-05-10 DIAGNOSIS — R030 Elevated blood-pressure reading, without diagnosis of hypertension: Secondary | ICD-10-CM

## 2017-05-10 DIAGNOSIS — O2342 Unspecified infection of urinary tract in pregnancy, second trimester: Secondary | ICD-10-CM

## 2017-05-10 DIAGNOSIS — O0990 Supervision of high risk pregnancy, unspecified, unspecified trimester: Secondary | ICD-10-CM

## 2017-05-10 DIAGNOSIS — O24410 Gestational diabetes mellitus in pregnancy, diet controlled: Secondary | ICD-10-CM

## 2017-05-10 DIAGNOSIS — O0993 Supervision of high risk pregnancy, unspecified, third trimester: Secondary | ICD-10-CM

## 2017-05-10 NOTE — Patient Instructions (Signed)
Gestational Diabetes Mellitus, Diagnosis Gestational diabetes (gestational diabetes mellitus) is a short-term (temporary) form of diabetes that can happen during pregnancy. It goes away after you give birth. It may be caused by one or both of these problems:  Your body does not make enough of a hormone called insulin.  Your body does not respond in a normal way to insulin that it makes.  Insulin lets sugars (glucose) go into cells in the body. This gives you energy. If you have diabetes, sugars cannot get into cells. This causes high blood sugar (hyperglycemia). If diabetes is treated, it may not hurt you or your baby. Your doctor will set treatment goals for you. In general, you should have these blood sugar levels:  After not eating for a long time (fasting): 95 mg/dL (5.3 mmol/L).  After meals (postprandial): ? One hour after a meal: at or below 140 mg/dL (7.8 mmol/L). ? Two hours after a meal: at or below 120 mg/dL (6.7 mmol/L).  A1c (hemoglobin A1c) level: 6-6.5%.  Follow these instructions at home: Questions to Ask Your Doctor  You may want to ask these questions:  Do I need to meet with a diabetes educator?  Where can I find a support group for people with diabetes?  What equipment will I need to care for myself at home?  What diabetes medicines do I need? When should I take them?  How often do I need to check my blood sugar?  What number can I call if I have questions?  When is my next doctor's visit?  General instructions  Take over-the-counter and prescription medicines only as told by your doctor.  Stay at a healthy weight during pregnancy.  Keep all follow-up visits as told by your doctor. This is important. Contact a doctor if:  Your blood sugar is at or above 240 mg/dL (40.9 mmol/L).  Your blood sugar is at or above 200 mg/dL (81.1 mmol/L) and you have ketones in your pee (urine).  You have been sick or have had a fever for 2 days or more and you are  not getting better.  You have any of these problems for more than 6 hours: ? You cannot eat or drink. ? You feel sick to your stomach (nauseous). ? You throw up (vomit). ? You have watery poop (diarrhea). Get help right away if:  Your blood sugar is lower than 54 mg/dL (3 mmol/L).  You get confused.  You have trouble: ? Thinking clearly. ? Breathing.  Your baby moves less than normal.  You have: ? Moderate or large ketone levels in your pee (urine). ? Bleeding from your vagina. ? Unusual fluid coming from your vagina. ? Early contractions. These may feel like tightness in your belly. This information is not intended to replace advice given to you by your health care provider. Make sure you discuss any questions you have with your health care provider. Document Released: 08/17/2015 Document Revised: 10/01/2015 Document Reviewed: 05/29/2015 Elsevier Interactive Patient Education  2018 [employer]. Hypertension During Pregnancy Hypertension is also called high blood pressure. High blood pressure means that the force of your blood moving in your body is too strong. When you are pregnant, this condition should be watched carefully. It can cause problems for you and your baby. Follow these instructions at home: Eating and drinking  Drink enough fluid to keep your pee (urine) clear or pale yellow.  Eat healthy foods that are low in salt (sodium). ? Do not add salt to your  food. ? Check labels on foods and drinks to see much salt is in them. Look on the label where you see "Sodium." Lifestyle  Do not use any products that contain nicotine or tobacco, such as cigarettes and e-cigarettes. If you need help quitting, ask your doctor.  Do not use alcohol.  Avoid caffeine.  Avoid stress. Rest and get plenty of sleep. General instructions  Take over-the-counter and prescription medicines only as told by your doctor.  While lying down, lie on your left side. This keeps pressure  off your baby.  While sitting or lying down, raise (elevate) your feet. Try putting some pillows under your lower legs.  Exercise regularly. Ask your doctor what kinds of exercise are best for you.  Keep all prenatal and follow-up visits as told by your doctor. This is important. Contact a doctor if:  You have symptoms that your doctor told you to watch for, such as: ? Fever. ? Throwing up (vomiting). ? Headache. Get help right away if:  You have very bad pain in your belly (abdomen).  You are throwing up, and this does not get better with treatment.  You suddenly get swelling in your hands, ankles, or face.  You gain 4 lb (1.8 kg) or more in 1 week.  You get bleeding from your vagina.  You have blood in your pee.  You do not feel your baby moving as much as normal.  You have a change in vision.  You have muscle twitching or sudden tightening (spasms).  You have trouble breathing.  Your lips or fingernails turn blue. This information is not intended to replace advice given to you by your health care provider. Make sure you discuss any questions you have with your health care provider. Document Released: 05/28/2010 Document Revised: 01/05/2016 Document Reviewed: 01/05/2016 Elsevier Interactive Patient Education  [employer]2018 [employer] Inc[employer]

## 2017-05-10 NOTE — Progress Notes (Signed)
Pre-eclampsia  GHTN - O13.9/Preeclampsia without severe features  - O14.00   Preeclampsia with severe features - O14.10  Q  3-4wks  Q 2 wks  28//BPP wkly then 32//2 x wk or BPP wkly  Inpatient  37  PRN or 34

## 2017-05-11 LAB — CMP AND LIVER
ALT: 16 IU/L (ref 0–32)
AST: 20 IU/L (ref 0–40)
Albumin: 4.2 g/dL (ref 3.5–5.5)
Alkaline Phosphatase: 118 IU/L — ABNORMAL HIGH (ref 39–117)
BUN: 8 mg/dL (ref 6–20)
Bilirubin Total: 0.5 mg/dL (ref 0.0–1.2)
Bilirubin, Direct: 0.16 mg/dL (ref 0.00–0.40)
CO2: 19 mmol/L — ABNORMAL LOW (ref 20–29)
CREATININE: 0.52 mg/dL — AB (ref 0.57–1.00)
Calcium: 9.7 mg/dL (ref 8.7–10.2)
Chloride: 98 mmol/L (ref 96–106)
GFR calc Af Amer: 160 mL/min/{1.73_m2} (ref >59)
GFR calc non Af Amer: 139 mL/min/{1.73_m2} (ref >59)
GLUCOSE: 72 mg/dL (ref 65–99)
Potassium: 4 mmol/L (ref 3.5–5.2)
SODIUM: 135 mmol/L (ref 134–144)
TOTAL PROTEIN: 7.1 g/dL (ref 6.0–8.5)

## 2017-05-11 LAB — PROTEIN / CREATININE RATIO, URINE
CREATININE, UR: 37.4 mg/dL
PROTEIN UR: 5.6 mg/dL
Protein/Creat Ratio: 150 mg/g creat (ref 0–200)

## 2017-05-11 NOTE — Progress Notes (Signed)
Patient ID: Alexis Blanchard, female   DOB: 06/24/1995, 22 y.o.   MRN: [id number]   PRENATAL VISIT NOTE  Subjective:  Alexis Blanchard is a 22 y.o. G2P0010 at 4425w1d being seen today for ongoing prenatal care.  She is currently monitored for the following issues for this high-risk pregnancy and has Supervision of high risk pregnancy, antepartum; Rubella non-immune status, antepartum; Alpha thalassemia silent carrier; Transient hypertension; UTI in pregnancy, antepartum, second trimester; Low weight gain during pregnancy in second trimester; GDM (gestational diabetes mellitus); GBS bacteriuria; and Gestational hypertension on their problem list.  Patient reports no complaints.  Contractions: Not present.  .  Movement: Present. Denies leaking of fluid.  Patient did not take her medication for UTI; she thought it was for her sister. She denies headache, epigastric pain, blurry vision, sudden swelling.   The following portions of the patient's history were reviewed and updated as appropriate: allergies, current medications, past family history, past medical history, past social history, past surgical history and problem list. Problem list updated.  Objective:   Vitals:   05/10/17 1451 05/11/17 0815  BP: (!) 145/98 (!) 145/104    Fetal Status: Fetal Heart Rate (bpm): 135 Fundal Height: 30 cm Movement: Present     General:  Alert, oriented and cooperative. Patient is in no acute distress.  Skin: Skin is warm and dry. No rash noted.   Cardiovascular: Normal heart rate noted  Respiratory: Normal respiratory effort, no problems with respiration noted  Abdomen: Soft, gravid, appropriate for gestational age.  Pain/Pressure: Absent     Pelvic: Cervical exam deferred        Extremities: Normal range of motion.  Edema: None  Mental Status:  Normal mood and affect. Normal behavior. Normal judgment and thought content.   Assessment and Plan:  Pregnancy: G2P0010 at 5625w1d  1. UTI in pregnancy,  antepartum, second trimester No signs/symptoms today but patient will take her macrobid as prescribed.   2. GBS bacteriuria Patient will take macrobid.   3. Supervision of high risk pregnancy, antepartum Patient to begin BPP and antenatal testing.   4. Diet controlled gestational diabetes mellitus (GDM) in third trimester -Patient to attend diabetic class on 05-15-2017.   5. Transient hypertension -New diagnosis with GHTN.   6. Gestational hypertension, third trimester -Three elevated readings today, along with elevated reading on 04-03-2017. Patient meets criteria for GHTN.  - CMP and Liver - Protein / creatinine ratio, urine  - US MFM FETAL BPP WO NON STRESS; Future - US MFM FETAL BPP WO NON STRESS; Future  Preterm labor symptoms and general obstetric precautions including but not limited to vaginal bleeding, contractions, leaking of fluid and fetal movement were reviewed in detail with the patient. Please refer to After Visit Summary for other counseling recommendations.  Return in about 2 weeks (around 05/24/2017) for HROB with MD.   Marylene LandKathryn Lorraine Kooistra, CNM

## 2017-05-12 ENCOUNTER — Ambulatory Visit (HOSPITAL_COMMUNITY)
Admission: RE | Admit: 2017-05-12 | Discharge: 2017-05-12 | Disposition: A | Discharge status: Home / Self Care | Payer: Medicaid Other | Source: Ambulatory Visit | Attending: Student | Admitting: Student

## 2017-05-12 ENCOUNTER — Other Ambulatory Visit: Payer: Self-pay | Admitting: Student

## 2017-05-12 DIAGNOSIS — Z3A30 30 weeks gestation of pregnancy: Secondary | ICD-10-CM | POA: Diagnosis not present

## 2017-05-12 DIAGNOSIS — O133 Gestational [pregnancy-induced] hypertension without significant proteinuria, third trimester: Secondary | ICD-10-CM

## 2017-05-12 DIAGNOSIS — O321XX0 Maternal care for breech presentation, not applicable or unspecified: Secondary | ICD-10-CM | POA: Insufficient documentation

## 2017-05-15 ENCOUNTER — Encounter: Payer: Self-pay | Admitting: Registered"

## 2017-05-15 ENCOUNTER — Encounter: Payer: Medicaid Other | Attending: Obstetrics and Gynecology | Admitting: Registered"

## 2017-05-15 DIAGNOSIS — R7309 Other abnormal glucose: Secondary | ICD-10-CM

## 2017-05-15 DIAGNOSIS — O24419 Gestational diabetes mellitus in pregnancy, unspecified control: Secondary | ICD-10-CM | POA: Insufficient documentation

## 2017-05-15 DIAGNOSIS — Z713 Dietary counseling and surveillance: Secondary | ICD-10-CM | POA: Insufficient documentation

## 2017-05-15 NOTE — Progress Notes (Signed)
Patient was seen on 05/15/17 for Gestational Diabetes self-management class at the [facility]. The following learning objectives were met by the patient during this course:   States the definition of Gestational Diabetes  States why dietary management is important in controlling blood glucose  Describes the effects each nutrient has on blood glucose levels  Demonstrates ability to create a balanced meal plan  Demonstrates carbohydrate counting   States when to check blood glucose levels  Demonstrates proper blood glucose monitoring techniques  States the effect of stress and exercise on blood glucose levels  States the importance of limiting caffeine and abstaining from alcohol and smoking  Blood glucose monitor given: none Lot # n/a Exp: n/a Blood glucose reading: n/a (brought in log book, numbers within target except 1 reading)  Patient instructed to monitor glucose levels: FBS: 60 - <95 1 hour: <140 2 hour: <120  Patient received handouts:  Nutrition Diabetes and Pregnancy  Carbohydrate Counting List  Patient will be seen for follow-up as needed.

## 2017-05-16 ENCOUNTER — Encounter: Payer: Self-pay | Admitting: Radiology

## 2017-05-19 ENCOUNTER — Other Ambulatory Visit: Payer: Self-pay | Admitting: Student

## 2017-05-19 ENCOUNTER — Ambulatory Visit (HOSPITAL_COMMUNITY)
Admission: RE | Admit: 2017-05-19 | Discharge: 2017-05-19 | Disposition: A | Discharge status: Home / Self Care | Payer: Medicaid Other | Source: Ambulatory Visit | Attending: Student | Admitting: Student

## 2017-05-19 DIAGNOSIS — Z3A31 31 weeks gestation of pregnancy: Secondary | ICD-10-CM | POA: Insufficient documentation

## 2017-05-19 DIAGNOSIS — O24410 Gestational diabetes mellitus in pregnancy, diet controlled: Secondary | ICD-10-CM | POA: Insufficient documentation

## 2017-05-19 DIAGNOSIS — O133 Gestational [pregnancy-induced] hypertension without significant proteinuria, third trimester: Secondary | ICD-10-CM | POA: Diagnosis present

## 2017-05-19 DIAGNOSIS — O321XX0 Maternal care for breech presentation, not applicable or unspecified: Secondary | ICD-10-CM | POA: Insufficient documentation

## 2017-05-22 ENCOUNTER — Ambulatory Visit (HOSPITAL_COMMUNITY)
Admission: RE | Admit: 2017-05-22 | Discharge: 2017-05-22 | Disposition: A | Discharge status: Home / Self Care | Payer: Medicaid Other | Source: Ambulatory Visit | Attending: Obstetrics & Gynecology | Admitting: Obstetrics & Gynecology

## 2017-05-22 ENCOUNTER — Other Ambulatory Visit: Payer: Self-pay | Admitting: Obstetrics & Gynecology

## 2017-05-22 ENCOUNTER — Inpatient Hospital Stay (HOSPITAL_COMMUNITY)
Admission: AD | Admit: 2017-05-22 | Discharge: 2017-05-22 | Disposition: A | Discharge status: Home / Self Care | Payer: Medicaid Other | Source: Ambulatory Visit | Attending: Obstetrics and Gynecology | Admitting: Obstetrics and Gynecology

## 2017-05-22 ENCOUNTER — Ambulatory Visit (INDEPENDENT_AMBULATORY_CARE_PROVIDER_SITE_OTHER): Payer: Medicaid Other | Admitting: Obstetrics & Gynecology

## 2017-05-22 ENCOUNTER — Encounter (HOSPITAL_COMMUNITY): Payer: Self-pay | Admitting: *Deleted

## 2017-05-22 VITALS — BP 145/99 | HR 105

## 2017-05-22 DIAGNOSIS — Z3689 Encounter for other specified antenatal screening: Secondary | ICD-10-CM

## 2017-05-22 DIAGNOSIS — O4703 False labor before 37 completed weeks of gestation, third trimester: Secondary | ICD-10-CM | POA: Diagnosis not present

## 2017-05-22 DIAGNOSIS — O133 Gestational [pregnancy-induced] hypertension without significant proteinuria, third trimester: Secondary | ICD-10-CM

## 2017-05-22 DIAGNOSIS — Z3A31 31 weeks gestation of pregnancy: Secondary | ICD-10-CM | POA: Diagnosis not present

## 2017-05-22 DIAGNOSIS — O09893 Supervision of other high risk pregnancies, third trimester: Secondary | ICD-10-CM | POA: Insufficient documentation

## 2017-05-22 DIAGNOSIS — O24410 Gestational diabetes mellitus in pregnancy, diet controlled: Secondary | ICD-10-CM | POA: Insufficient documentation

## 2017-05-22 DIAGNOSIS — Z7982 Long term (current) use of aspirin: Secondary | ICD-10-CM | POA: Insufficient documentation

## 2017-05-22 DIAGNOSIS — O288 Other abnormal findings on antenatal screening of mother: Secondary | ICD-10-CM

## 2017-05-22 DIAGNOSIS — O0990 Supervision of high risk pregnancy, unspecified, unspecified trimester: Secondary | ICD-10-CM

## 2017-05-22 DIAGNOSIS — O0993 Supervision of high risk pregnancy, unspecified, third trimester: Secondary | ICD-10-CM

## 2017-05-22 LAB — PROTEIN / CREATININE RATIO, URINE: CREATININE, URINE: 27 mg/dL

## 2017-05-22 LAB — COMPREHENSIVE METABOLIC PANEL
ALBUMIN: 3.1 g/dL — AB (ref 3.5–5.0)
ALK PHOS: 126 U/L (ref 38–126)
ALT: 12 U/L — AB (ref 14–54)
AST: 18 U/L (ref 15–41)
Anion gap: 10 (ref 5–15)
BILIRUBIN TOTAL: 0.8 mg/dL (ref 0.3–1.2)
BUN: 9 mg/dL (ref 6–20)
CO2: 22 mmol/L (ref 22–32)
CREATININE: 0.64 mg/dL (ref 0.44–1.00)
Calcium: 9.1 mg/dL (ref 8.9–10.3)
Chloride: 105 mmol/L (ref 101–111)
GFR calc Af Amer: >60 mL/min (ref >60)
GFR calc non Af Amer: >60 mL/min (ref >60)
GLUCOSE: 75 mg/dL (ref 65–99)
POTASSIUM: 4.2 mmol/L (ref 3.5–5.1)
Sodium: 137 mmol/L (ref 135–145)
TOTAL PROTEIN: 6.8 g/dL (ref 6.5–8.1)

## 2017-05-22 LAB — CBC
HEMATOCRIT: 30.2 % — AB (ref 36.0–46.0)
HEMOGLOBIN: 10 g/dL — AB (ref 12.0–15.0)
MCH: 24.3 pg — ABNORMAL LOW (ref 26.0–34.0)
MCHC: 33.1 g/dL (ref 30.0–36.0)
MCV: 73.3 fL — ABNORMAL LOW (ref 78.0–100.0)
Platelets: 330 10*3/uL (ref 150–400)
RBC: 4.12 MIL/uL (ref 3.87–5.11)
RDW: 14.4 % (ref 11.5–15.5)
WBC: 9.3 10*3/uL (ref 4.0–10.5)

## 2017-05-22 LAB — URINALYSIS, ROUTINE W REFLEX MICROSCOPIC
BILIRUBIN URINE: NEGATIVE
Glucose, UA: NEGATIVE mg/dL
Hgb urine dipstick: NEGATIVE
KETONES UR: 20 mg/dL — AB
Leukocytes, UA: NEGATIVE
NITRITE: NEGATIVE
PROTEIN: NEGATIVE mg/dL
Specific Gravity, Urine: 1.003 — ABNORMAL LOW (ref 1.005–1.030)
pH: 6 (ref 5.0–8.0)

## 2017-05-22 MED ORDER — LABETALOL HCL 200 MG PO TABS: 200.0000 mg | ORAL_TABLET | Freq: Two times a day (BID) | ORAL | 3 refills | Status: DC

## 2017-05-22 NOTE — Discharge Instructions (Signed)
Fetal Movement Counts °Patient Name: ________________________________________________ Patient Due Date: ____________________ °What is a fetal movement count? °A fetal movement count is the number of times that you feel your baby move during a certain amount of time. This may also be called a fetal kick count. A fetal movement count is recommended for every pregnant woman. You may be asked to start counting fetal movements as early as week 28 of your pregnancy. °Pay attention to when your baby is most active. You may notice your baby's sleep and wake cycles. You may also notice things that make your baby move more. You should do a fetal movement count: °· When your baby is normally most active. °· At the same time each day. ° °A good time to count movements is while you are resting, after having something to eat and drink. °How do I count fetal movements? °1. Find a quiet, comfortable area. Sit, or lie down on your side. °2. Write down the date, the start time and stop time, and the number of movements that you felt between those two times. Take this information with you to your health care visits. °3. For 2 hours, count kicks, flutters, swishes, rolls, and jabs. You should feel at least 10 movements during 2 hours. °4. You may stop counting after you have felt 10 movements. °5. If you do not feel 10 movements in 2 hours, have something to eat and drink. Then, keep resting and counting for 1 hour. If you feel at least 4 movements during that hour, you may stop counting. °Contact a health care provider if: °· You feel fewer than 4 movements in 2 hours. °· Your baby is not moving like he or she usually does. °Date: ____________ Start time: ____________ Stop time: ____________ Movements: ____________ °Date: ____________ Start time: ____________ Stop time: ____________ Movements: ____________ °Date: ____________ Start time: ____________ Stop time: ____________ Movements: ____________ °Date: ____________ Start time:  ____________ Stop time: ____________ Movements: ____________ °Date: ____________ Start time: ____________ Stop time: ____________ Movements: ____________ °Date: ____________ Start time: ____________ Stop time: ____________ Movements: ____________ °Date: ____________ Start time: ____________ Stop time: ____________ Movements: ____________ °Date: ____________ Start time: ____________ Stop time: ____________ Movements: ____________ °Date: ____________ Start time: ____________ Stop time: ____________ Movements: ____________ °This information is not intended to replace advice given to you by your health care provider. Make sure you discuss any questions you have with your health care provider. °Document Released: 05/25/2006 Document Revised: 12/23/2015 Document Reviewed: 06/04/2015 °Elsevier Interactive Patient Education © 2018 [employer] ° ° ° °Hypertension During Pregnancy °Hypertension, commonly called high blood pressure, is when the force of blood pumping through your arteries is too strong. Arteries are blood vessels that carry blood from the heart throughout the body. Hypertension during pregnancy can cause problems for you and your baby. Your baby may be born early (prematurely) or may not weigh as much as he or she should at birth. Very bad cases of hypertension during pregnancy can be life-threatening. °Different types of hypertension can occur during pregnancy. These include: °· Chronic hypertension. This happens when: °? You have hypertension before pregnancy and it continues during pregnancy. °? You develop hypertension before you are [redacted] weeks pregnant, and it continues during pregnancy. °· Gestational hypertension. This is hypertension that develops after the 20th week of pregnancy. °· Preeclampsia, also called toxemia of pregnancy. This is a very serious type of hypertension that develops only during pregnancy. It affects the whole body, and it can be very dangerous for you and   your baby. ° °Gestational  hypertension and preeclampsia usually go away within 6 weeks after your baby is born. Women who have hypertension during pregnancy have a greater chance of developing hypertension later in life or during future pregnancies. °What are the causes? °The exact cause of hypertension is not known. °What increases the risk? °There are certain factors that make it more likely for you to develop hypertension during pregnancy. These include: °· Having hypertension during a previous pregnancy or prior to pregnancy. °· Being overweight. °· Being older than age 40. °· Being pregnant for the first time or being pregnant with more than one baby. °· Becoming pregnant using fertilization methods such as IVF (in vitro fertilization). °· Having diabetes, kidney problems, or systemic lupus erythematosus. °· Having a family history of hypertension. ° °What are the signs or symptoms? °Chronic hypertension and gestational hypertension rarely cause symptoms. Preeclampsia causes symptoms, which may include: °· Increased protein in your urine. Your health care provider will check for this at every visit before you give birth (prenatal visit). °· Severe headaches. °· Sudden weight gain. °· Swelling of the hands, face, legs, and feet. °· Nausea and vomiting. °· Vision problems, such as blurred or double vision. °· Numbness in the face, arms, legs, and feet. °· Dizziness. °· Slurred speech. °· Sensitivity to bright lights. °· Abdominal pain. °· Convulsions. ° °How is this diagnosed? °You may be diagnosed with hypertension during a routine prenatal exam. At each prenatal visit, you may: °· Have a urine test to check for high amounts of protein in your urine. °· Have your blood pressure checked. A blood pressure reading is recorded as two numbers, such as "120 over 80" (or 120/80). The first ("top") number is called the systolic pressure. It is a measure of the pressure in your arteries when your heart beats. The second ("bottom") number is  called the diastolic pressure. It is a measure of the pressure in your arteries as your heart relaxes between beats. Blood pressure is measured in a unit called mm Hg. A normal blood pressure reading is: °? Systolic: below 120. °? Diastolic: below 80. ° °The type of hypertension that you are diagnosed with depends on your test results and when your symptoms developed. °· Chronic hypertension is usually diagnosed before 20 weeks of pregnancy. °· Gestational hypertension is usually diagnosed after 20 weeks of pregnancy. °· Hypertension with high amounts of protein in the urine is diagnosed as preeclampsia. °· Blood pressure measurements that stay above 160 systolic, or above 110 diastolic, are signs of severe preeclampsia. ° °How is this treated? °Treatment for hypertension during pregnancy varies depending on the type of hypertension you have and how serious it is. °· If you take medicines called ACE inhibitors to treat chronic hypertension, you may need to switch medicines. ACE inhibitors should not be taken during pregnancy. °· If you have gestational hypertension, you may need to take blood pressure medicine. °· If you are at risk for preeclampsia, your health care provider may recommend that you take a low-dose aspirin every day to prevent high blood pressure during your pregnancy. °· If you have severe preeclampsia, you may need to be hospitalized so you and your baby can be monitored closely. You may also need to take medicine (magnesium sulfate) to prevent seizures and to lower blood pressure. This medicine may be given as an injection or through an IV tube. °· In some cases, if your condition gets worse, you may need to deliver your baby   early. ° °Follow these instructions at home: °Eating and drinking °· Drink enough fluid to keep your urine clear or pale yellow. °· Eat a healthy diet that is low in salt (sodium). Do not add salt to your food. Check food labels to see how much sodium a food or beverage  contains. °Lifestyle °· Do not use any products that contain nicotine or tobacco, such as cigarettes and e-cigarettes. If you need help quitting, ask your health care provider. °· Do not use alcohol. °· Avoid caffeine. °· Avoid stress as much as possible. Rest and get plenty of sleep. °General instructions °· Take over-the-counter and prescription medicines only as told by your health care provider. °· While lying down, lie on your left side. This keeps pressure off your baby. °· While sitting or lying down, raise (elevate) your feet. Try putting some pillows under your lower legs. °· Exercise regularly. Ask your health care provider what kinds of exercise are best for you. °· Keep all prenatal and follow-up visits as told by your health care provider. This is important. °Contact a health care provider if: °· You have symptoms that your health care provider told you may require more treatment or monitoring, such as: °? Fever. °? Vomiting. °? Headache. °Get help right away if: °· You have severe abdominal pain or vomiting that does not get better with treatment. °· You suddenly develop swelling in your hands, ankles, or face. °· You gain 4 lbs (1.8 kg) or more in 1 week. °· You develop vaginal bleeding, or you have blood in your urine. °· You do not feel your baby moving as much as usual. °· You have blurred or double vision. °· You have muscle twitching or sudden tightening (spasms). °· You have shortness of breath. °· Your lips or fingernails turn blue. °This information is not intended to replace advice given to you by your health care provider. Make sure you discuss any questions you have with your health care provider. °Document Released: 01/11/2011 Document Revised: 11/13/2015 Document Reviewed: 10/09/2015 °[employer]vier Interactive Patient Education © 2018 [employer] ° °

## 2017-05-22 NOTE — MAU Provider Note (Signed)
History     CSN: [id number]  Arrival date and time: 05/22/17 1641   First Provider Initiated Contact with Patient 05/22/17 1720      Chief Complaint  Patient presents with  . non reactive NST   HPI  Alexis Blanchard is a 22 y.o. G2P0010 at [redacted]w[redacted]d who presents from MFM for fetal monitoring. Current pregnancy complicated by [facility] & GDM.  Has ob appointment today. Had non reactive fetal tracing so was sent to MFM for BPP; BPP 6/8, off for breathing. BP higher than baseline today in office. Patient denies headache, visual disturbance, or epigastric pain.  Denies contractions, abdominal pain, LOF, dysuria, vaginal bleeding, or n/v/d. Positive fetal movement.   OB History    Gravida Para Term Preterm AB Living   2 0 0 0 1 0   SAB TAB Ectopic Multiple Live Births   1 0 0 0 0      Past Medical History:  Diagnosis Date  . Medical history non-contributory     Past Surgical History:  Procedure Laterality Date  . TONSILLECTOMY      Family History  Problem Relation Age of Onset  . Hypertension Mother   . Heart disease Maternal Aunt        CHF  . Diabetes Maternal Grandmother   . Hypertension Maternal Grandmother   . Heart disease Maternal Grandmother   . Cancer Maternal Aunt 45       colon  . Cancer Maternal Aunt 45       throat    Social History   Tobacco Use  . Smoking status: Never Smoker  . Smokeless tobacco: Never Used  Substance Use Topics  . Alcohol use: Yes    Comment: socially  . Drug use: Yes    Types: Marijuana    Allergies: No Known Allergies  Medications Prior to Admission  Medication Sig Dispense Refill Last Dose  . ACCU-CHEK FASTCLIX LANCETS MISC Check blood sugars 4 times a day.  024.410 100 each 12 Taking  . aspirin EC 81 MG tablet Take 1 tablet (81 mg total) by mouth daily. 60 tablet 3 Taking  . glucose blood test strip Check sugar 4 times a day 250.00 100 each 12 Taking  . nitrofurantoin, macrocrystal-monohydrate, (MACROBID) 100 MG capsule  Take 1 capsule (100 mg total) by mouth 2 (two) times daily. Take the 2nd dose qhs (Patient not taking: Reported on 05/10/2017) 14 capsule 0 Not Taking  . Prenat w/o A Vit-FeFum-FePo-FA (CONCEPT OB) 130-92.4-1 MG CAPS Take 1 tablet by mouth daily. 30 capsule 12 Taking  . sulfamethoxazole-trimethoprim (BACTRIM DS,SEPTRA DS) 800-160 MG tablet Take 1 tablet 2 (two) times daily by mouth. (Patient not taking: Reported on 05/10/2017) 20 tablet 0 Not Taking    Review of Systems  Constitutional: Negative.   Eyes: Negative for visual disturbance.  Gastrointestinal: Negative.   Genitourinary: Negative.   Neurological: Negative for headaches.   Physical Exam   Blood pressure (!) 149/94, pulse 86, temperature 98.2 F (36.8 C), temperature source Oral, resp. rate 18, last menstrual period 09/28/2016, unknown if currently breastfeeding. Patient Vitals for the past 24 hrs:  BP Temp Temp src Pulse Resp  05/22/17 2029 - 98.3 F (36.8 C) Oral - -  05/22/17 1900 (!) 143/81 - - (!) 106 -  05/22/17 1850 (!) 147/84 - - (!) 114 -  05/22/17 1815 (!) 152/105 - - 92 -  05/22/17 1800 (!) 147/100 - - 86 -  05/22/17 1730 (!) 150/100 - - 93 -  05/22/17 1715 (!) 149/94 - - 86 -  05/22/17 1703 (!) 150/94 - - 98 -  05/22/17 1654 (!) 152/100 - - (!) 114 -  05/22/17 1653 (!) 152/100 98.2 F (36.8 C) Oral (!) 114 18    Physical Exam  Nursing note and vitals reviewed. Constitutional: She is oriented to person, place, and time. She appears well-developed and well-nourished. No distress.  HENT:  Head: Normocephalic and atraumatic.  Eyes: Conjunctivae are normal. Right eye exhibits no discharge. Left eye exhibits no discharge. No scleral icterus.  Neck: Normal range of motion.  Cardiovascular: Normal rate, regular rhythm and normal heart sounds.  No murmur heard. Respiratory: Effort normal and breath sounds normal. No respiratory distress. She has no wheezes.  GI: Soft. There is no tenderness.  Genitourinary:   Genitourinary Comments: Dilation: Closed Effacement (%): Thick Cervical Position: Posterior Exam by:: Judeth HornErin Derin Matthes, NP   Musculoskeletal: She exhibits no edema.  Neurological: She is alert and oriented to person, place, and time. She has normal reflexes.  No clonus  Skin: Skin is warm and dry. She is not diaphoretic.  Psychiatric: She has a normal mood and affect. Her behavior is normal. Judgment and thought content normal.    MAU Course  Procedures Results for orders placed or performed during the hospital encounter of 05/22/17 (from the past 24 hour(s))  CBC     Status: Abnormal   Collection Time: 05/22/17  5:02 PM  Result Value Ref Range   WBC 9.3 4.0 - 10.5 K/uL   RBC 4.12 3.87 - 5.11 MIL/uL   Hemoglobin 10.0 (L) 12.0 - 15.0 g/dL   HCT 13.030.2 (L) 86.536.0 - 78.446.0 %   MCV 73.3 (L) 78.0 - 100.0 fL   MCH 24.3 (L) 26.0 - 34.0 pg   MCHC 33.1 30.0 - 36.0 g/dL   RDW 69.614.4 29.511.5 - 28.415.5 %   Platelets 330 150 - 400 K/uL  Comprehensive metabolic panel     Status: Abnormal   Collection Time: 05/22/17  5:02 PM  Result Value Ref Range   Sodium 137 135 - 145 mmol/L   Potassium 4.2 3.5 - 5.1 mmol/L   Chloride 105 101 - 111 mmol/L   CO2 22 22 - 32 mmol/L   Glucose, Bld 75 65 - 99 mg/dL   BUN 9 6 - 20 mg/dL   Creatinine, Ser 1.320.64 0.44 - 1.00 mg/dL   Calcium 9.1 8.9 - 44.010.3 mg/dL   Total Protein 6.8 6.5 - 8.1 g/dL   Albumin 3.1 (L) 3.5 - 5.0 g/dL   AST 18 15 - 41 U/L   ALT 12 (L) 14 - 54 U/L   Alkaline Phosphatase 126 38 - 126 U/L   Total Bilirubin 0.8 0.3 - 1.2 mg/dL   GFR calc non Af Amer >60 >60 mL/min   GFR calc Af Amer >60 >60 mL/min   Anion gap 10 5 - 15  Protein / creatinine ratio, urine     Status: None   Collection Time: 05/22/17  6:43 PM  Result Value Ref Range   Creatinine, Urine 27.00 mg/dL   Total Protein, Urine <6.00 mg/dL   Protein Creatinine Ratio        0.00 - 0.15 mg/mg[Cre]  Urinalysis, Routine w reflex microscopic     Status: Abnormal   Collection Time: 05/22/17   6:43 PM  Result Value Ref Range   Color, Urine STRAW (A) YELLOW   APPearance CLEAR CLEAR   Specific Gravity, Urine 1.003 (L) 1.005 - 1.030  pH 6.0 5.0 - 8.0   Glucose, UA NEGATIVE NEGATIVE mg/dL   Hgb urine dipstick NEGATIVE NEGATIVE   Bilirubin Urine NEGATIVE NEGATIVE   Ketones, ur 20 (A) NEGATIVE mg/dL   Protein, ur NEGATIVE NEGATIVE mg/dL   Nitrite NEGATIVE NEGATIVE   Leukocytes, UA NEGATIVE NEGATIVE   [place] Mfm Fetal Bpp Wo Non Stress  Result Date: 05/22/2017 ----------------------------------------------------------------------  OBSTETRICS REPORT                        (Corrected Final 05/22/2017 04:40                                                                           pm) ---------------------------------------------------------------------- Patient Info  ID #:       [id number]                          D.O.B.:  May 23, 1995 (22 yrs)  Name:       Alexis Blanchard                Visit Date: 05/22/2017 03:51 pm ---------------------------------------------------------------------- Performed By  Performed By:     Lenise Arena        Ref. Address:      [address]                                                              [place], [place]                                                              [address]  Attending:        Charlsie Merles MD         Location:          [facility]  Referred By:      Reva Bores                    MD ---------------------------------------------------------------------- Orders   #  Description                                 Code   1  [place] MFM FETAL BPP WO NON STRESS  16109.60  ----------------------------------------------------------------------   #  Ordered By               Order #        Accession #    Episode #   1  Nicholaus Bloom                [id number]      [id number]     [id number]  ----------------------------------------------------------------------  Indications   [redacted] weeks gestation of pregnancy                Z3A.31   Hypertension - Gestational                     O13.9   Gestational diabetes in pregnancy, diet        O24.410   controlled  ---------------------------------------------------------------------- OB History  Blood Type:            Height:  5'2"   Weight (lb):  107       BMI:  19.57  Gravidity:    2         Term:   0        Prem:   0        SAB:   1  TOP:          0       Ectopic:  0        Living: 0 ---------------------------------------------------------------------- Fetal Evaluation  Num Of Fetuses:     1  Cardiac Activity:   Observed  Placenta:           Posterior, above cervical os  Amniotic Fluid  AFI FV:      Subjectively within normal limits  AFI Sum(cm)     %Tile       Largest Pocket(cm)  17.88           66          5.32  RUQ(cm)       RLQ(cm)       LUQ(cm)        LLQ(cm)  4.09          3.56          5.32           4.91 ---------------------------------------------------------------------- Biophysical Evaluation  Amniotic F.V:   Within normal limits       F. Tone:         Observed  F. Movement:    Observed                   Score:           6/8  F. Breathing:   Not Observed ---------------------------------------------------------------------- Gestational Age  LMP:           33w 5d        Date:  09/28/16                 EDD:   07/05/17  Best:          31w 5d     Det. By:  Previous Ultrasound      EDD:   07/19/17                                      (01/16/17) ---------------------------------------------------------------------- Anatomy  Thoracic:              Appears  normal         Abdomen:                Appears normal  Stomach:               Appears normal, left   Bladder:                Appears normal                         sided ---------------------------------------------------------------------- Impression  IUP at 31+5 weeks with GDM and gestational HTN  Normal amniotic fluid  BPP 6/8 with no breathing noted and nonreactive NST  ---------------------------------------------------------------------- Recommendations  Recommend prolonged monitoring and repeat BPP within 24  hours ----------------------------------------------------------------------                      Charlsie Merles, MD Electronically Signed Corrected Final Report  05/22/2017 04:40 pm ----------------------------------------------------------------------   MDM NST:  Baseline: 135 bpm, Variability: Good {> 6 bpm), Accelerations: Reactive and Decelerations: Absent  Pt initially contracting every 2-3 minutes. Denies feeling any abdominal pain or contractions. Cervix closed/thick/high. Ctx decreased with oral hydration.  Reactive NST Elevated BPs in MAU. None severe range & pt asymptomatic. CBC, CMP, urine PCR collected. No proteinuria.   Assessment and Plan  A:  1. NST (non-stress test) reactive   2. Gestational hypertension, third trimester   3. Preterm uterine contractions in third trimester, antepartum   4. [redacted] weeks gestation of pregnancy   . P: Discharge home Discussed reasons to return to MAU including PTL or s/s preeclampsia Keep f/u with OB later this week  Judeth Horn 05/22/2017, 5:49 PM

## 2017-05-22 NOTE — Progress Notes (Signed)
   PRENATAL VISIT NOTE  Subjective:  Alexis Blanchard is a 22 y.o. Single G2P0010 at 2429w5d being seen today for ongoing prenatal care.  She is currently monitored for the following issues for this high-risk pregnancy and has Supervision of high risk pregnancy, antepartum; Rubella non-immune status, antepartum; Alpha thalassemia silent carrier; Transient hypertension; UTI in pregnancy, antepartum, second trimester; Low weight gain during pregnancy in second trimester; GDM (gestational diabetes mellitus); GBS bacteriuria; Gestational hypertension; and Impaired glucose tolerance test on their problem list.  Patient reports no complaints.  Contractions: Not present.  .  Movement: Present. Denies leaking of fluid.   The following portions of the patient's history were reviewed and updated as appropriate: allergies, current medications, past family history, past medical history, past social history, past surgical history and problem list. Problem list updated.  Objective:   Vitals:   05/22/17 1414  BP: (!) 145/99  Pulse: (!) 105    Fetal Status:     Movement: Present     General:  Alert, oriented and cooperative. Patient is in no acute distress.  Skin: Skin is warm and dry. No rash noted.   Cardiovascular: Normal heart rate noted  Respiratory: Normal respiratory effort, no problems with respiration noted  Abdomen: Soft, gravid, appropriate for gestational age.  Pain/Pressure: Absent     Pelvic: Cervical exam deferred        Extremities: Normal range of motion.  Edema: None  Mental Status:  Normal mood and affect. Normal behavior. Normal judgment and thought content.   Assessment and Plan:  Pregnancy: G2P0010 at 5929w5d  1. Diet controlled gestational diabetes mellitus (GDM) in third trimester Excellent sugars recorded  2. Gestational hypertension, third trimester  3. Supervision of high risk pregnancy, antepartum - continue twice weekly testing - Since her NST is reassuring, but non  reactive, she will need a BPP at MFM today  Preterm labor symptoms and general obstetric precautions including but not limited to vaginal bleeding, contractions, leaking of fluid and fetal movement were reviewed in detail with the patient. Please refer to After Visit Summary for other counseling recommendations.  No Follow-up on file.   Allie BossierMyra C Nocole Zammit, MD

## 2017-05-22 NOTE — MAU Note (Addendum)
Pt sent from MD office, had non-reactive NST, had [facility], then sent to [facility].   Pt denies pain, bleeding or LOF.  Pt's BP 152/99, denies HA or blurred vision.

## 2017-05-25 ENCOUNTER — Ambulatory Visit (INDEPENDENT_AMBULATORY_CARE_PROVIDER_SITE_OTHER): Payer: Medicaid Other | Admitting: *Deleted

## 2017-05-25 ENCOUNTER — Telehealth: Payer: Self-pay | Admitting: Student

## 2017-05-25 DIAGNOSIS — O133 Gestational [pregnancy-induced] hypertension without significant proteinuria, third trimester: Secondary | ICD-10-CM

## 2017-05-25 NOTE — Telephone Encounter (Signed)
Entered in error

## 2017-05-29 ENCOUNTER — Other Ambulatory Visit: Payer: Self-pay | Admitting: Obstetrics & Gynecology

## 2017-05-29 ENCOUNTER — Ambulatory Visit (HOSPITAL_COMMUNITY)
Admission: RE | Admit: 2017-05-29 | Discharge: 2017-05-29 | Disposition: A | Discharge status: Home / Self Care | Payer: Medicaid Other | Source: Ambulatory Visit | Attending: Obstetrics & Gynecology | Admitting: Obstetrics & Gynecology

## 2017-05-29 DIAGNOSIS — O133 Gestational [pregnancy-induced] hypertension without significant proteinuria, third trimester: Secondary | ICD-10-CM | POA: Diagnosis present

## 2017-05-29 DIAGNOSIS — Z3A32 32 weeks gestation of pregnancy: Secondary | ICD-10-CM | POA: Insufficient documentation

## 2017-06-01 ENCOUNTER — Encounter (HOSPITAL_COMMUNITY): Payer: Self-pay

## 2017-06-01 ENCOUNTER — Inpatient Hospital Stay (HOSPITAL_COMMUNITY)
Admission: AD | Admit: 2017-06-01 | Discharge: 2017-06-01 | Disposition: A | Discharge status: Home / Self Care | Payer: Medicaid Other | Source: Ambulatory Visit | Attending: Obstetrics and Gynecology | Admitting: Obstetrics and Gynecology

## 2017-06-01 ENCOUNTER — Other Ambulatory Visit: Payer: Self-pay

## 2017-06-01 ENCOUNTER — Ambulatory Visit: Payer: Medicaid Other

## 2017-06-01 VITALS — BP 153/98 | HR 103

## 2017-06-01 DIAGNOSIS — R519 Headache, unspecified: Secondary | ICD-10-CM

## 2017-06-01 DIAGNOSIS — R030 Elevated blood-pressure reading, without diagnosis of hypertension: Secondary | ICD-10-CM | POA: Diagnosis present

## 2017-06-01 DIAGNOSIS — R51 Headache: Secondary | ICD-10-CM | POA: Insufficient documentation

## 2017-06-01 DIAGNOSIS — O133 Gestational [pregnancy-induced] hypertension without significant proteinuria, third trimester: Secondary | ICD-10-CM | POA: Diagnosis not present

## 2017-06-01 DIAGNOSIS — Z3A33 33 weeks gestation of pregnancy: Secondary | ICD-10-CM

## 2017-06-01 DIAGNOSIS — O163 Unspecified maternal hypertension, third trimester: Secondary | ICD-10-CM

## 2017-06-01 DIAGNOSIS — Z7982 Long term (current) use of aspirin: Secondary | ICD-10-CM | POA: Insufficient documentation

## 2017-06-01 LAB — URINALYSIS, ROUTINE W REFLEX MICROSCOPIC
BILIRUBIN URINE: NEGATIVE
GLUCOSE, UA: NEGATIVE mg/dL
Hgb urine dipstick: NEGATIVE
KETONES UR: NEGATIVE mg/dL
Leukocytes, UA: NEGATIVE
Nitrite: NEGATIVE
PH: 7 (ref 5.0–8.0)
Protein, ur: NEGATIVE mg/dL
SPECIFIC GRAVITY, URINE: 1.004 — AB (ref 1.005–1.030)

## 2017-06-01 LAB — COMPREHENSIVE METABOLIC PANEL
ALK PHOS: 146 U/L — AB (ref 38–126)
ALT: 14 U/L (ref 14–54)
ANION GAP: 11 (ref 5–15)
AST: 21 U/L (ref 15–41)
Albumin: 2.7 g/dL — ABNORMAL LOW (ref 3.5–5.0)
BUN: 10 mg/dL (ref 6–20)
CALCIUM: 8.8 mg/dL — AB (ref 8.9–10.3)
CHLORIDE: 102 mmol/L (ref 101–111)
CO2: 20 mmol/L — ABNORMAL LOW (ref 22–32)
CREATININE: 0.61 mg/dL (ref 0.44–1.00)
Glucose, Bld: 97 mg/dL (ref 65–99)
Potassium: 3.5 mmol/L (ref 3.5–5.1)
Sodium: 133 mmol/L — ABNORMAL LOW (ref 135–145)
Total Bilirubin: 0.5 mg/dL (ref 0.3–1.2)
Total Protein: 6.8 g/dL (ref 6.5–8.1)

## 2017-06-01 LAB — CBC
HCT: 29.4 % — ABNORMAL LOW (ref 36.0–46.0)
Hemoglobin: 9.8 g/dL — ABNORMAL LOW (ref 12.0–15.0)
MCH: 23.8 pg — AB (ref 26.0–34.0)
MCHC: 33.3 g/dL (ref 30.0–36.0)
MCV: 71.4 fL — AB (ref 78.0–100.0)
PLATELETS: 348 10*3/uL (ref 150–400)
RBC: 4.12 MIL/uL (ref 3.87–5.11)
RDW: 14.9 % (ref 11.5–15.5)
WBC: 13.1 10*3/uL — ABNORMAL HIGH (ref 4.0–10.5)

## 2017-06-01 LAB — PROTEIN / CREATININE RATIO, URINE
CREATININE, URINE: 25 mg/dL
Total Protein, Urine: <6 mg/dL

## 2017-06-01 MED ORDER — ACETAMINOPHEN 500 MG PO TABS
1000.0000 mg | ORAL_TABLET | Freq: Once | ORAL | Status: AC
  Administered 2017-06-01: 1000 mg via ORAL
  Filled 2017-06-01: qty 2

## 2017-06-01 NOTE — MAU Provider Note (Signed)
History     CSN: [id number]  Arrival date and time: 06/01/17 1517   First Provider Initiated Contact with Patient 06/01/17 1552      Chief Complaint  Patient presents with  . Hypertension   HPI   Ms.Alexis Blanchard is a 22 y.o. female with a history of gestation HTN,  G2P0010 @ 58w1dhere in MAU for a BP workup. [facility] She was sent here for further workup. She complains of an off and on frontal HA that she currently rates 2/10. She has not tried anything over the counter for the HA. + fetal movement.   OB History    Gravida Para Term Preterm AB Living   2 0 0 0 1 0   SAB TAB Ectopic Multiple Live Births   1 0 0 0 0      Past Medical History:  Diagnosis Date  . Medical history non-contributory     Past Surgical History:  Procedure Laterality Date  . TONSILLECTOMY      Family History  Problem Relation Age of Onset  . Hypertension Mother   . Heart disease Maternal Aunt        CHF  . Diabetes Maternal Grandmother   . Hypertension Maternal Grandmother   . Heart disease Maternal Grandmother   . Cancer Maternal Aunt 45       colon  . Cancer Maternal Aunt 45       throat    Social History   Tobacco Use  . Smoking status: Never Smoker  . Smokeless tobacco: Never Used  Substance Use Topics  . Alcohol use: Yes    Comment: pt states that she has not used since finding out about the pregnancy  . Drug use: Yes    Types: Marijuana    Comment: pt states that she has not used since finding out about the pregnancy    Allergies: No Known Allergies  Medications Prior to Admission  Medication Sig Dispense Refill Last Dose  . aspirin EC 81 MG tablet Take 1 tablet (81 mg total) by mouth daily. 60 tablet 3 05/31/2017 at Unknown time  . Prenat w/o A Vit-FeFum-FePo-FA (CONCEPT OB) 130-92.4-1 MG CAPS Take 1 tablet by mouth daily. 30 capsule 12 Past Week at Unknown time  . ACCU-CHEK FASTCLIX LANCETS MISC Check  blood sugars 4 times a day.  024.410 100 each 12 Taking  . glucose blood test strip Check sugar 4 times a day 250.00 100 each 12 Taking   Results for orders placed or performed during the hospital encounter of 06/01/17 (from the past 48 hour(s))  Urinalysis, Routine w reflex microscopic     Status: Abnormal   Collection Time: 06/01/17  3:19 PM  Result Value Ref Range   Color, Urine STRAW (A) YELLOW   APPearance CLEAR CLEAR   Specific Gravity, Urine 1.004 (L) 1.005 - 1.030   pH 7.0 5.0 - 8.0   Glucose, UA NEGATIVE NEGATIVE mg/dL   Hgb urine dipstick NEGATIVE NEGATIVE   Bilirubin Urine NEGATIVE NEGATIVE   Ketones, ur NEGATIVE NEGATIVE mg/dL   Protein, ur NEGATIVE NEGATIVE mg/dL   Nitrite NEGATIVE NEGATIVE   Leukocytes, UA NEGATIVE NEGATIVE  Protein / creatinine ratio, urine     Status: None   Collection Time: 06/01/17  3:19 PM  Result Value Ref Range   Creatinine, Urine 25.00 mg/dL   Total Protein, Urine <6 mg/dL    Comment: REPEATED TO  VERIFY   Protein Creatinine Ratio        0.00 - 0.15 mg/mg[Cre]    Comment: RESULT BELOW REPORTABLE RANGE, UNABLE TO CALCULATE.   CBC     Status: Abnormal   Collection Time: 06/01/17  4:18 PM  Result Value Ref Range   WBC 13.1 (H) 4.0 - 10.5 K/uL   RBC 4.12 3.87 - 5.11 MIL/uL   Hemoglobin 9.8 (L) 12.0 - 15.0 g/dL   HCT 29.4 (L) 36.0 - 46.0 %   MCV 71.4 (L) 78.0 - 100.0 fL   MCH 23.8 (L) 26.0 - 34.0 pg   MCHC 33.3 30.0 - 36.0 g/dL   RDW 14.9 11.5 - 15.5 %   Platelets 348 150 - 400 K/uL  Comprehensive metabolic panel     Status: Abnormal   Collection Time: 06/01/17  4:18 PM  Result Value Ref Range   Sodium 133 (L) 135 - 145 mmol/L   Potassium 3.5 3.5 - 5.1 mmol/L   Chloride 102 101 - 111 mmol/L   CO2 20 (L) 22 - 32 mmol/L   Glucose, Bld 97 65 - 99 mg/dL   BUN 10 6 - 20 mg/dL   Creatinine, Ser 0.61 0.44 - 1.00 mg/dL   Calcium 8.8 (L) 8.9 - 10.3 mg/dL   Total Protein 6.8 6.5 - 8.1 g/dL   Albumin 2.7 (L) 3.5 - 5.0 g/dL   AST 21 15 - 41  U/L   ALT 14 14 - 54 U/L   Alkaline Phosphatase 146 (H) 38 - 126 U/L   Total Bilirubin 0.5 0.3 - 1.2 mg/dL   GFR calc non Af Amer >60 >60 mL/min   GFR calc Af Amer >60 >60 mL/min    Comment: (NOTE) The eGFR has been calculated using the CKD EPI equation. This calculation has not been validated in all clinical situations. eGFR's persistently <60 mL/min signify possible Chronic Kidney Disease.    Anion gap 11 5 - 15    Review of Systems  Eyes: Negative for photophobia and visual disturbance.  Gastrointestinal: Negative for abdominal pain.  Neurological: Positive for headaches.   Physical Exam   Blood pressure (!) 152/98, pulse (!) 102, temperature 98.8 F (37.1 C), temperature source Oral, resp. rate 16, last menstrual period 09/28/2016, SpO2 98 %, unknown if currently breastfeeding.   Patient Vitals for the past 24 hrs:  BP Temp Temp src Pulse Resp SpO2  06/01/17 1731 (!) 152/98 - - (!) 102 - -  06/01/17 1716 (!) 146/101 - - (!) 106 - -  06/01/17 1701 (!) 146/91 - - (!) 107 - -  06/01/17 1646 (!) 139/97 - - (!) 112 - -  06/01/17 1631 (!) 145/99 - - (!) 120 - -  06/01/17 1615 (!) 148/88 - - (!) 118 - 98 %  06/01/17 1601 (!) 145/97 - - (!) 118 - -  06/01/17 1546 (!) 147/96 - - (!) 123 - -  06/01/17 1544 (!) 152/102 - - (!) 123 - 98 %  06/01/17 1543 (!) 149/99 - - (!) 121 - -  06/01/17 1531 (!) 146/98 98.8 F (37.1 C) Oral (!) 118 16 100 %   Physical Exam  Constitutional: She is oriented to person, place, and time. She appears well-developed and well-nourished. No distress.  HENT:  Head: Normocephalic.  Eyes: Pupils are equal, round, and reactive to light.  GI: Soft. She exhibits no distension. There is no tenderness. There is no rebound.  Musculoskeletal: Normal range of motion.  Neurological: She is  alert and oriented to person, place, and time. She has normal reflexes. She displays normal reflexes.  Negative clonus   Skin: Skin is warm. She is not diaphoretic.   Psychiatric: Her behavior is normal.   Fetal Tracing: Baseline: 150 bpm Variability: Moderate  Accelerations: 15x15 Decelerations: None Toco: Occasional/ rare.   MAU Course  Procedures None  MDM  PIH work up PCR Tylenol 1 gram given PO: HA down from 2/10 to 0/10 BP's not severe range  Patient has close follow up in the office   Assessment and Plan   A:  1. Gestational hypertension, third trimester   2. Acute nonintractable headache, unspecified headache type   3. [redacted] weeks gestation of pregnancy     P:  Discharge home with strict return precautions Preeclampsia precautions  Follow up with OB as scheduled on Monday Ok to use tylenol OTC as directed on the bottle.   Lezlie Lye, NP 06/01/2017 6:17 PM

## 2017-06-01 NOTE — Progress Notes (Signed)
Patient complaining of headache for a couple of days

## 2017-06-01 NOTE — Progress Notes (Signed)
Patient presented to the office today for her weekly NST. Patient reports having a headache for a couple of days. Patient was placed on the NST for 20 minutes, however the orbit system was down so paper copy of the tracing was obtained. Baby was  looks to be reactive according to paper tracing.   Call[facility] and gave [facility] provider on call who suggested patient go directly to the MAU to be seen for blood work. Patient verbalizes understanding at this time. MAU has been notified of patient coming to be work up.

## 2017-06-01 NOTE — MAU Note (Signed)
Pt sent from office for blood pressure.

## 2017-06-01 NOTE — MAU Provider Note (Signed)
History      CSN: [id number]  Arrival date and time: 06/01/17 1517   First Provider Initiated Contact with Patient 06/01/17 1552     Chief Complaint  Patient presents with  . Hypertension   HPI Alexis Blanchard is a 44F G2P0 @[redacted]w[redacted]d  who was referred from [facility] office for elevated BP and headache during weekly NST (reactive today). Current pregnancy complicated by A1GDM and gHTN. Not currently taking any BP medication. Pt states that she has had 2 headaches/day the past 2 days that last 10-15 minutes, resolving on their own. She currently has a mild headache, rated 2/10. Denies vision changes, abdominal pain, dyspnea, palpitations, peripheral edema, fever, or chills, nausea, or vomiting. Endorses regular fetal movement, occasional contractions, but no vaginal bleeding or leak of fluids. Tolerating PO well.   OB History    Gravida Para Term Preterm AB Living   2 0 0 0 1 0   SAB TAB Ectopic Multiple Live Births   1 0 0 0 0      Past Medical History:  Diagnosis Date  . Medical history non-contributory     Past Surgical History:  Procedure Laterality Date  . TONSILLECTOMY      Family History  Problem Relation Age of Onset  . Hypertension Mother   . Heart disease Maternal Aunt        CHF  . Diabetes Maternal Grandmother   . Hypertension Maternal Grandmother   . Heart disease Maternal Grandmother   . Cancer Maternal Aunt 45       colon  . Cancer Maternal Aunt 45       throat    Social History   Tobacco Use  . Smoking status: Never Smoker  . Smokeless tobacco: Never Used  Substance Use Topics  . Alcohol use: Yes    Comment: pt states that she has not used since finding out about the pregnancy  . Drug use: Yes    Types: Marijuana    Comment: pt states that she has not used since finding out about the pregnancy    Allergies: No Known Allergies  Medications Prior to Admission  Medication Sig Dispense Refill Last Dose  . aspirin EC 81 MG tablet Take 1  tablet (81 mg total) by mouth daily. 60 tablet 3 05/31/2017 at Unknown time  . Prenat w/o A Vit-FeFum-FePo-FA (CONCEPT OB) 130-92.4-1 MG CAPS Take 1 tablet by mouth daily. 30 capsule 12 Past Week at Unknown time  . ACCU-CHEK FASTCLIX LANCETS MISC Check blood sugars 4 times a day.  024.410 100 each 12 Taking  . glucose blood test strip Check sugar 4 times a day 250.00 100 each 12 Taking    Review of Systems  Constitutional: Negative for appetite change, chills, fever and unexpected weight change.  Respiratory: Negative for shortness of breath.   Cardiovascular: Negative for chest pain, palpitations and leg swelling.  Gastrointestinal: Negative for abdominal pain, constipation, diarrhea, nausea and vomiting.  Genitourinary: Negative for dysuria, flank pain, pelvic pain, vaginal bleeding and vaginal pain.  Neurological: Positive for headaches (intermittent). Negative for dizziness and light-headedness.  Psychiatric/Behavioral: Negative for confusion. The patient is not nervous/anxious.   All other systems reviewed and are negative.  Physical Exam   Blood pressure (!) 152/98, pulse (!) 102, temperature 98.8 F (37.1 C), temperature source Oral, resp. rate 16, last menstrual period 09/28/2016, SpO2 98 %, unknown if currently breastfeeding.   Today's Vitals   06/01/17 1646 06/01/17 1701 06/01/17 1716 06/01/17 1731  BP: (!) 139/97 (!) 146/91 (!) 146/101 (!) 152/98  Pulse: (!) 112 (!) 107 (!) 106 (!) 102  Resp:      Temp:      TempSrc:      SpO2:      PainSc:        Physical Exam  Constitutional: She is oriented to person, place, and time. She appears well-developed and well-nourished. No distress.  HENT:  Head: Normocephalic and atraumatic.  Eyes: EOM are normal. Pupils are equal, round, and reactive to light.  Cardiovascular: Normal rate, regular rhythm and normal heart sounds.  No murmur heard. Respiratory: Effort normal and breath sounds normal. No respiratory distress. She has no  wheezes. She has no rales.  GI: Soft. Bowel sounds are normal. There is no tenderness. There is no rebound and no guarding.  Musculoskeletal: She exhibits edema.  Neurological: She is alert and oriented to person, place, and time. She has normal reflexes.  Skin: Skin is warm and dry.  Psychiatric: She has a normal mood and affect. Her behavior is normal.   FHT: Baseline 150bpm with good variability and appropriate accelerations. No decels noted.   Results for orders placed or performed during the hospital encounter of 06/01/17 (from the past 24 hour(s))  Urinalysis, Routine w reflex microscopic     Status: Abnormal   Collection Time: 06/01/17  3:19 PM  Result Value Ref Range   Color, Urine STRAW (A) YELLOW   APPearance CLEAR CLEAR   Specific Gravity, Urine 1.004 (L) 1.005 - 1.030   pH 7.0 5.0 - 8.0   Glucose, UA NEGATIVE NEGATIVE mg/dL   Hgb urine dipstick NEGATIVE NEGATIVE   Bilirubin Urine NEGATIVE NEGATIVE   Ketones, ur NEGATIVE NEGATIVE mg/dL   Protein, ur NEGATIVE NEGATIVE mg/dL   Nitrite NEGATIVE NEGATIVE   Leukocytes, UA NEGATIVE NEGATIVE  Protein / creatinine ratio, urine     Status: None   Collection Time: 06/01/17  3:19 PM  Result Value Ref Range   Creatinine, Urine 25.00 mg/dL   Total Protein, Urine <6 mg/dL   Protein Creatinine Ratio        0.00 - 0.15 mg/mg[Cre]  CBC     Status: Abnormal   Collection Time: 06/01/17  4:18 PM  Result Value Ref Range   WBC 13.1 (H) 4.0 - 10.5 K/uL   RBC 4.12 3.87 - 5.11 MIL/uL   Hemoglobin 9.8 (L) 12.0 - 15.0 g/dL   HCT 16.1 (L) 09.6 - 04.5 %   MCV 71.4 (L) 78.0 - 100.0 fL   MCH 23.8 (L) 26.0 - 34.0 pg   MCHC 33.3 30.0 - 36.0 g/dL   RDW 40.9 81.1 - 91.4 %   Platelets 348 150 - 400 K/uL  Comprehensive metabolic panel     Status: Abnormal   Collection Time: 06/01/17  4:18 PM  Result Value Ref Range   Sodium 133 (L) 135 - 145 mmol/L   Potassium 3.5 3.5 - 5.1 mmol/L   Chloride 102 101 - 111 mmol/L   CO2 20 (L) 22 - 32 mmol/L    Glucose, Bld 97 65 - 99 mg/dL   BUN 10 6 - 20 mg/dL   Creatinine, Ser 7.82 0.44 - 1.00 mg/dL   Calcium 8.8 (L) 8.9 - 10.3 mg/dL   Total Protein 6.8 6.5 - 8.1 g/dL   Albumin 2.7 (L) 3.5 - 5.0 g/dL   AST 21 15 - 41 U/L   ALT 14 14 - 54 U/L   Alkaline Phosphatase 146 (H)  38 - 126 U/L   Total Bilirubin 0.5 0.3 - 1.2 mg/dL   GFR calc non Af Amer >60 >60 mL/min   GFR calc Af Amer >60 >60 mL/min   Anion gap 11 5 - 15     MAU Course  Procedures   MDM MSE Exam Labs: CBC, CMP, UA, P/C ratio Meds: - 1g Tylenol for headache (pain rated 2/10 orignially --> 0/10 1hr later)  Given the non-intractable nature of headaches, along with non-concerning labs or severe pressures, Pt is not likely preeclamptic. Advised tylenol for HA as needed, however to seek immediate attention if they do not resolve or other symptoms of PreE are experienced. Will also continue to withhold BP medication at this time. Encouraged good PO intake and hydration. Patient voiced understanding and all questions were answered.  Assessment and Plan   1. Gestational hypertension, third trimester   2. Acute nonintractable headache, unspecified headache type   3. [redacted] weeks gestation of pregnancy    - Advised Tylenol for HA as needed, but warned to come in right away if they do not resolve. - Discussed emergent signs and symptoms of preeclampsia and the importance of seeking immediate attention. - Encouraged Good PO intake and Hydration - Pt will follow up in clinic on Monday for routine prenatal appointment   Asa SaunasNathan J Laberta Wilbon PA-S 06/01/2017, 6:17 PM

## 2017-06-01 NOTE — Discharge Instructions (Signed)

## 2017-06-05 ENCOUNTER — Ambulatory Visit (INDEPENDENT_AMBULATORY_CARE_PROVIDER_SITE_OTHER): Payer: Medicaid Other | Admitting: Obstetrics & Gynecology

## 2017-06-05 VITALS — BP 152/107 | HR 102

## 2017-06-05 DIAGNOSIS — O0993 Supervision of high risk pregnancy, unspecified, third trimester: Secondary | ICD-10-CM

## 2017-06-05 DIAGNOSIS — O133 Gestational [pregnancy-induced] hypertension without significant proteinuria, third trimester: Secondary | ICD-10-CM

## 2017-06-05 DIAGNOSIS — O321XX0 Maternal care for breech presentation, not applicable or unspecified: Secondary | ICD-10-CM

## 2017-06-05 DIAGNOSIS — O2343 Unspecified infection of urinary tract in pregnancy, third trimester: Secondary | ICD-10-CM

## 2017-06-05 DIAGNOSIS — O2342 Unspecified infection of urinary tract in pregnancy, second trimester: Secondary | ICD-10-CM

## 2017-06-05 DIAGNOSIS — O0990 Supervision of high risk pregnancy, unspecified, unspecified trimester: Secondary | ICD-10-CM

## 2017-06-05 MED ORDER — BETAMETHASONE SOD PHOS & ACET 6 (3-3) MG/ML IJ SUSP
12.0000 mg | Freq: Once | INTRAMUSCULAR | Status: AC
  Administered 2017-06-05: 12 mg via INTRAMUSCULAR

## 2017-06-05 NOTE — Progress Notes (Signed)
   PRENATAL VISIT NOTE  Subjective:  Alexis Blanchard is a 22 y.o. G2P0010 at 8324w5d being seen today for ongoing prenatal care.  She is currently monitored for the following issues for this high-risk pregnancy and has Supervision of high risk pregnancy, antepartum; Rubella non-immune status, antepartum; Alpha thalassemia silent carrier; Transient hypertension; UTI in pregnancy, antepartum, second trimester; Low weight gain during pregnancy in second trimester; GDM (gestational diabetes mellitus); GBS bacteriuria; Gestational hypertension; and Impaired glucose tolerance test on their problem list.  Patient reports no complaints.  Denies any headaches, visual symptoms, RUQ/epigastric pain.  Movement: Present. Denies leaking of fluid.   The following portions of the patient's history were reviewed and updated as appropriate: allergies, current medications, past family history, past medical history, past social history, past surgical history and problem list. Problem list updated.  Objective:   Vitals:   06/05/17 1410  BP: (!) 152/107  Pulse: (!) 102    Fetal Status: Fetal Heart Rate (bpm): NST  Fundal Height: 33 cm Movement: Present  Presentation: Complete Breech  General:  Alert, oriented and cooperative. Patient is in no acute distress.  Skin: Skin is warm and dry. No rash noted.   Cardiovascular: Normal heart rate noted  Respiratory: Normal respiratory effort, no problems with respiration noted  Abdomen: Soft, gravid, appropriate for gestational age.  Pain/Pressure: Absent     Pelvic: Cervical exam deferred        Extremities: Normal range of motion.  Edema: None  Mental Status:  Normal mood and affect. Normal behavior. Normal judgment and thought content.     Assessment and Plan:  Pregnancy: G2P0010 at 6524w5d  1. Gestational hypertension, third trimester BP still elevated.  No symptoms. Had normal labs 4 days ago.  NST performed today was reviewed and was found to be reactive.  AFI  was also normal.  Continue recommended antenatal testing and prenatal care.   Discussed concern about rising BP, possible need for delivery ? 34 weeks  for severe range BP or other features. Betamethasone given today, will return tomorrow for next dose and BP check. Preeclampsia precautions advised. - CBC - Comprehensive metabolic panel - Protein / creatinine ratio, urine - US MFM OB FOLLOW UP; Future - US MFM FETAL BPP WO NON STRESS; Future   2. Breech presentation, single or unspecified fetus Seen during AFI today. Patient aware that malpresentation may necessitate cesarean delivery if needed.   3. UTI in pregnancy, antepartum, second trimester TOC done. - Culture, OB Urine  4. Supervision of high risk pregnancy, antepartum Preterm labor symptoms and general obstetric precautions including but not limited to vaginal bleeding, contractions, leaking of fluid and fetal movement were reviewed in detail with the patient. Please refer to After Visit Summary for other counseling recommendations.  Return in about 1 day (around 06/06/2017) for BMZ#2 (RN visit).  06/08/17:  NST only.   Jaynie CollinsUgonna Anyanwu, MD

## 2017-06-05 NOTE — Patient Instructions (Signed)
Return to clinic for any scheduled appointments or obstetric concerns, or go to MAU for evaluation  

## 2017-06-06 ENCOUNTER — Ambulatory Visit (HOSPITAL_COMMUNITY)
Admission: RE | Admit: 2017-06-06 | Discharge: 2017-06-06 | Disposition: A | Discharge status: Home / Self Care | Payer: Medicaid Other | Source: Ambulatory Visit | Attending: Obstetrics & Gynecology | Admitting: Obstetrics & Gynecology

## 2017-06-06 ENCOUNTER — Other Ambulatory Visit: Payer: Self-pay | Admitting: Obstetrics & Gynecology

## 2017-06-06 ENCOUNTER — Ambulatory Visit (INDEPENDENT_AMBULATORY_CARE_PROVIDER_SITE_OTHER): Payer: Medicaid Other | Admitting: *Deleted

## 2017-06-06 ENCOUNTER — Encounter: Payer: Self-pay | Admitting: Obstetrics & Gynecology

## 2017-06-06 VITALS — BP 152/119

## 2017-06-06 DIAGNOSIS — O24419 Gestational diabetes mellitus in pregnancy, unspecified control: Secondary | ICD-10-CM

## 2017-06-06 DIAGNOSIS — O24410 Gestational diabetes mellitus in pregnancy, diet controlled: Secondary | ICD-10-CM | POA: Insufficient documentation

## 2017-06-06 DIAGNOSIS — O133 Gestational [pregnancy-induced] hypertension without significant proteinuria, third trimester: Secondary | ICD-10-CM | POA: Insufficient documentation

## 2017-06-06 DIAGNOSIS — Z3A33 33 weeks gestation of pregnancy: Secondary | ICD-10-CM | POA: Diagnosis not present

## 2017-06-06 DIAGNOSIS — O321XX0 Maternal care for breech presentation, not applicable or unspecified: Secondary | ICD-10-CM | POA: Insufficient documentation

## 2017-06-06 LAB — COMPREHENSIVE METABOLIC PANEL
A/G RATIO: 1.4 (ref 1.2–2.2)
ALBUMIN: 3.9 g/dL (ref 3.5–5.5)
ALK PHOS: 166 IU/L — AB (ref 39–117)
ALT: 15 IU/L (ref 0–32)
AST: 22 IU/L (ref 0–40)
BILIRUBIN TOTAL: 0.5 mg/dL (ref 0.0–1.2)
BUN / CREAT RATIO: 13 (ref 9–23)
BUN: 9 mg/dL (ref 6–20)
CHLORIDE: 101 mmol/L (ref 96–106)
CO2: 22 mmol/L (ref 20–29)
Calcium: 9.7 mg/dL (ref 8.7–10.2)
Creatinine, Ser: 0.67 mg/dL (ref 0.57–1.00)
GFR calc non Af Amer: 127 mL/min/{1.73_m2} (ref >59)
GFR, EST AFRICAN AMERICAN: 146 mL/min/{1.73_m2} (ref >59)
GLOBULIN, TOTAL: 2.8 g/dL (ref 1.5–4.5)
Glucose: 67 mg/dL (ref 65–99)
POTASSIUM: 4.5 mmol/L (ref 3.5–5.2)
SODIUM: 138 mmol/L (ref 134–144)
TOTAL PROTEIN: 6.7 g/dL (ref 6.0–8.5)

## 2017-06-06 LAB — CBC
HEMATOCRIT: 32.4 % — AB (ref 34.0–46.6)
HEMOGLOBIN: 10.4 g/dL — AB (ref 11.1–15.9)
MCH: 23.5 pg — AB (ref 26.6–33.0)
MCHC: 32.1 g/dL (ref 31.5–35.7)
MCV: 73 fL — AB (ref 79–97)
NRBC: 1 % — ABNORMAL HIGH (ref 0–0)
Platelets: 373 10*3/uL (ref 150–379)
RBC: 4.43 x10E6/uL (ref 3.77–5.28)
RDW: 15.5 % — ABNORMAL HIGH (ref 12.3–15.4)
WBC: 13.5 10*3/uL — ABNORMAL HIGH (ref 3.4–10.8)

## 2017-06-06 LAB — PROTEIN / CREATININE RATIO, URINE
CREATININE, UR: 24.9 mg/dL
Protein, Ur: 5 mg/dL
Protein/Creat Ratio: 201 mg/g creat — ABNORMAL HIGH (ref 0–200)

## 2017-06-06 MED ORDER — BETAMETHASONE SOD PHOS & ACET 6 (3-3) MG/ML IJ SUSP
12.0000 mg | Freq: Once | INTRAMUSCULAR | Status: AC
  Administered 2017-06-06: 12 mg via INTRAMUSCULAR

## 2017-06-06 NOTE — Progress Notes (Addendum)
I have reviewed the chart and agree with nursing staff's documentation of this patient's encounter.  Patient will be seen tomorrow, delivery may be indicated for any severe features.  She will be 3728w0d. Patient is very aware of this; received 2nd dose of betamethasone today. Preeclampsia precautions reviewed.    Jaynie CollinsUgonna Jimmy Stipes, MD 06/06/2017 2:52 PM

## 2017-06-06 NOTE — Progress Notes (Addendum)
Subjective:  Alexis Blanchard is a 22 y.o. female for second dose of betamethasone.  Current Outpatient Medications  Medication Sig Dispense Refill  . ACCU-CHEK FASTCLIX LANCETS MISC Check blood sugars 4 times a day.  024.410 100 each 12  . aspirin EC 81 MG tablet Take 1 tablet (81 mg total) by mouth daily. 60 tablet 3  . glucose blood test strip Check sugar 4 times a day 250.00 100 each 12  . Prenat w/o A Vit-FeFum-FePo-FA (CONCEPT OB) 130-92.4-1 MG CAPS Take 1 tablet by mouth daily. 30 capsule 12   No current facility-administered medications for this visit.     Hypertension ROS: patient does not perform home BP monitoring, no TIA's, no chest pain on exertion, no dyspnea on exertion, no swelling of ankles, no orthostatic dizziness or lightheadedness and no orthopnea or paroxysmal nocturnal dyspnea.    Objective:  BP (!) 152/119   LMP 09/28/2016   Appearance alert, well appearing, and in no distress and oriented to person, place, and time. General exam BP noted to be well controlled today in office.   Results for orders placed or performed in visit on 06/05/17 (from the past 72 hour(s))  CBC     Status: Abnormal   Collection Time: 06/05/17  3:50 PM  Result Value Ref Range   WBC 13.5 (H) 3.4 - 10.8 x10E3/uL   RBC 4.43 3.77 - 5.28 x10E6/uL   Hemoglobin 10.4 (L) 11.1 - 15.9 g/dL   Hematocrit 16.132.4 (L) 09.634.0 - 46.6 %   MCV 73 (L) 79 - 97 fL   MCH 23.5 (L) 26.6 - 33.0 pg   MCHC 32.1 31.5 - 35.7 g/dL   RDW 04.515.5 (H) 40.912.3 - 81.115.4 %   Platelets 373 150 - 379 x10E3/uL   NRBC 1 (H) 0 - 0 %  Comprehensive metabolic panel     Status: Abnormal   Collection Time: 06/05/17  3:50 PM  Result Value Ref Range   Glucose 67 65 - 99 mg/dL   BUN 9 6 - 20 mg/dL   Creatinine, Ser 9.140.67 0.57 - 1.00 mg/dL   GFR calc non Af Amer 127 >59 mL/min/1.73   GFR calc Af Amer 146 >59 mL/min/1.73   BUN/Creatinine Ratio 13 9 - 23   Sodium 138 134 - 144 mmol/L   Potassium 4.5 3.5 - 5.2 mmol/L   Chloride 101 96 -  106 mmol/L   CO2 22 20 - 29 mmol/L   Calcium 9.7 8.7 - 10.2 mg/dL   Total Protein 6.7 6.0 - 8.5 g/dL   Albumin 3.9 3.5 - 5.5 g/dL   Globulin, Total 2.8 1.5 - 4.5 g/dL   Albumin/Globulin Ratio 1.4 1.2 - 2.2   Bilirubin Total 0.5 0.0 - 1.2 mg/dL   Alkaline Phosphatase 166 (H) 39 - 117 IU/L   AST 22 0 - 40 IU/L   ALT 15 0 - 32 IU/L  Protein / creatinine ratio, urine     Status: Abnormal   Collection Time: 06/05/17  4:00 PM  Result Value Ref Range   Creatinine, Urine 24.9 Not Estab. mg/dL   Protein, Ur 5.0 Not Estab. mg/dL   Protein/Creat Ratio 782201 (H) 0 - 200 mg/g creat    Assessment:   Severe range BP reading, no symptoms.  Normal labs yesterday.  Plan:  Return to office tomorrow for BP recheck. Second dose of betamethasone given today. As per Dr. Macon LargeAnyanwu, if she has one more severe BP or other severe feature, will need delivery. Told to  be NPO after early breakfast (was breech yesterday).  Will need presentation rechecked if delivery indicated.   Janit Bern, CMA

## 2017-06-07 ENCOUNTER — Ambulatory Visit: Payer: Medicaid Other | Admitting: Student

## 2017-06-07 VITALS — BP 147/105

## 2017-06-07 DIAGNOSIS — Z0130 Encounter for examination of blood pressure without abnormal findings: Secondary | ICD-10-CM

## 2017-06-07 LAB — CULTURE, OB URINE

## 2017-06-07 LAB — URINE CULTURE, OB REFLEX: Organism ID, Bacteria: NO GROWTH

## 2017-06-07 NOTE — Progress Notes (Signed)
Patient here for repeat BP. Blood pressure today was 147/105 and 150/103. Patient denies HA, blurry vision, RUQ pain, sudden swelling, NV.   Case discussed with Dr. Jolayne Pantheronstant, who agrees that patient can return on Thursday for NST and BPP.   Patient is agreeable to plan of care, and will follow-up tomorrow as planned.   Alexis KitchensKathryn Jakeisha Blanchard

## 2017-06-08 ENCOUNTER — Inpatient Hospital Stay (HOSPITAL_COMMUNITY)
Admission: AD | Admit: 2017-06-08 | Discharge: 2017-06-11 | DRG: 788 | Disposition: A | Discharge status: Home / Self Care | Payer: Medicaid Other | Source: Ambulatory Visit | Attending: Obstetrics & Gynecology | Admitting: Obstetrics & Gynecology

## 2017-06-08 ENCOUNTER — Encounter (HOSPITAL_COMMUNITY)
Admission: AD | Disposition: A | Discharge status: Home / Self Care | Payer: Self-pay | Source: Ambulatory Visit | Attending: Obstetrics & Gynecology

## 2017-06-08 ENCOUNTER — Inpatient Hospital Stay (HOSPITAL_COMMUNITY): Payer: Medicaid Other | Admitting: Anesthesiology

## 2017-06-08 ENCOUNTER — Encounter (HOSPITAL_COMMUNITY): Payer: Self-pay | Admitting: *Deleted

## 2017-06-08 ENCOUNTER — Other Ambulatory Visit: Payer: Self-pay

## 2017-06-08 ENCOUNTER — Ambulatory Visit: Payer: Medicaid Other

## 2017-06-08 VITALS — BP 159/106 | HR 102

## 2017-06-08 DIAGNOSIS — O24420 Gestational diabetes mellitus in childbirth, diet controlled: Secondary | ICD-10-CM | POA: Diagnosis present

## 2017-06-08 DIAGNOSIS — O9089 Other complications of the puerperium, not elsewhere classified: Secondary | ICD-10-CM | POA: Diagnosis not present

## 2017-06-08 DIAGNOSIS — O99824 Streptococcus B carrier state complicating childbirth: Secondary | ICD-10-CM | POA: Diagnosis present

## 2017-06-08 DIAGNOSIS — R8271 Bacteriuria: Secondary | ICD-10-CM | POA: Diagnosis present

## 2017-06-08 DIAGNOSIS — O9902 Anemia complicating childbirth: Secondary | ICD-10-CM | POA: Diagnosis present

## 2017-06-08 DIAGNOSIS — O321XX0 Maternal care for breech presentation, not applicable or unspecified: Secondary | ICD-10-CM | POA: Diagnosis present

## 2017-06-08 DIAGNOSIS — D649 Anemia, unspecified: Secondary | ICD-10-CM | POA: Diagnosis present

## 2017-06-08 DIAGNOSIS — Z98891 History of uterine scar from previous surgery: Secondary | ICD-10-CM

## 2017-06-08 DIAGNOSIS — D563 Thalassemia minor: Secondary | ICD-10-CM | POA: Diagnosis present

## 2017-06-08 DIAGNOSIS — O9989 Other specified diseases and conditions complicating pregnancy, childbirth and the puerperium: Secondary | ICD-10-CM

## 2017-06-08 DIAGNOSIS — O1410 Severe pre-eclampsia, unspecified trimester: Secondary | ICD-10-CM | POA: Diagnosis present

## 2017-06-08 DIAGNOSIS — O0990 Supervision of high risk pregnancy, unspecified, unspecified trimester: Secondary | ICD-10-CM

## 2017-06-08 DIAGNOSIS — Z283 Underimmunization status: Secondary | ICD-10-CM

## 2017-06-08 DIAGNOSIS — Z7982 Long term (current) use of aspirin: Secondary | ICD-10-CM | POA: Diagnosis not present

## 2017-06-08 DIAGNOSIS — G8918 Other acute postprocedural pain: Secondary | ICD-10-CM | POA: Diagnosis not present

## 2017-06-08 DIAGNOSIS — O1414 Severe pre-eclampsia complicating childbirth: Principal | ICD-10-CM | POA: Diagnosis present

## 2017-06-08 DIAGNOSIS — Z3A34 34 weeks gestation of pregnancy: Secondary | ICD-10-CM

## 2017-06-08 DIAGNOSIS — O133 Gestational [pregnancy-induced] hypertension without significant proteinuria, third trimester: Secondary | ICD-10-CM

## 2017-06-08 DIAGNOSIS — O24419 Gestational diabetes mellitus in pregnancy, unspecified control: Secondary | ICD-10-CM | POA: Diagnosis present

## 2017-06-08 DIAGNOSIS — O09899 Supervision of other high risk pregnancies, unspecified trimester: Secondary | ICD-10-CM

## 2017-06-08 DIAGNOSIS — Z2839 Other underimmunization status: Secondary | ICD-10-CM

## 2017-06-08 DIAGNOSIS — R1030 Lower abdominal pain, unspecified: Secondary | ICD-10-CM | POA: Diagnosis present

## 2017-06-08 HISTORY — DX: Headache, unspecified: R51.9

## 2017-06-08 HISTORY — DX: Headache: R51

## 2017-06-08 LAB — COMPREHENSIVE METABOLIC PANEL
ALT: 18 U/L (ref 14–54)
AST: 25 U/L (ref 15–41)
Albumin: 3 g/dL — ABNORMAL LOW (ref 3.5–5.0)
Alkaline Phosphatase: 166 U/L — ABNORMAL HIGH (ref 38–126)
Anion gap: 11 (ref 5–15)
BUN: 10 mg/dL (ref 6–20)
CO2: 19 mmol/L — ABNORMAL LOW (ref 22–32)
Calcium: 8.9 mg/dL (ref 8.9–10.3)
Chloride: 101 mmol/L (ref 101–111)
Creatinine, Ser: 0.56 mg/dL (ref 0.44–1.00)
GFR calc Af Amer: >60 mL/min (ref >60)
GFR calc non Af Amer: >60 mL/min (ref >60)
Glucose, Bld: 84 mg/dL (ref 65–99)
Potassium: 3.4 mmol/L — ABNORMAL LOW (ref 3.5–5.1)
Sodium: 131 mmol/L — ABNORMAL LOW (ref 135–145)
Total Bilirubin: 0.5 mg/dL (ref 0.3–1.2)
Total Protein: 7 g/dL (ref 6.5–8.1)

## 2017-06-08 LAB — CBC
HCT: 29.4 % — ABNORMAL LOW (ref 36.0–46.0)
Hemoglobin: 9.8 g/dL — ABNORMAL LOW (ref 12.0–15.0)
MCH: 23.6 pg — ABNORMAL LOW (ref 26.0–34.0)
MCHC: 33.3 g/dL (ref 30.0–36.0)
MCV: 70.7 fL — ABNORMAL LOW (ref 78.0–100.0)
Platelets: 393 10*3/uL (ref 150–400)
RBC: 4.16 MIL/uL (ref 3.87–5.11)
RDW: 15.4 % (ref 11.5–15.5)
WBC: 11.3 10*3/uL — ABNORMAL HIGH (ref 4.0–10.5)

## 2017-06-08 LAB — URINALYSIS, ROUTINE W REFLEX MICROSCOPIC
Bilirubin Urine: NEGATIVE
Glucose, UA: NEGATIVE mg/dL
Hgb urine dipstick: NEGATIVE
Ketones, ur: NEGATIVE mg/dL
Leukocytes, UA: NEGATIVE
Nitrite: NEGATIVE
Protein, ur: NEGATIVE mg/dL
Specific Gravity, Urine: 1.002 — ABNORMAL LOW (ref 1.005–1.030)
pH: 7 (ref 5.0–8.0)

## 2017-06-08 LAB — PROTEIN / CREATININE RATIO, URINE
Creatinine, Urine: 15 mg/dL
Total Protein, Urine: <6 mg/dL

## 2017-06-08 LAB — GLUCOSE, CAPILLARY: Glucose-Capillary: 80 mg/dL (ref 65–99)

## 2017-06-08 SURGERY — Surgical Case
Anesthesia: Spinal

## 2017-06-08 MED ORDER — PHENYLEPHRINE 8 MG IN D5W 100 ML (0.08MG/ML) PREMIX OPTIME
INJECTION | INTRAVENOUS | Status: DC | PRN
  Administered 2017-06-08: 45 ug/min via INTRAVENOUS

## 2017-06-08 MED ORDER — KETOROLAC TROMETHAMINE 30 MG/ML IJ SOLN: 30.0000 mg | Freq: Four times a day (QID) | INTRAMUSCULAR | Status: AC | PRN

## 2017-06-08 MED ORDER — MAGNESIUM SULFATE 40 G IN LACTATED RINGERS - SIMPLE
2.0000 g/h | INTRAVENOUS | Status: DC
  Administered 2017-06-08: 2 g/h via INTRAVENOUS
  Filled 2017-06-08: qty 40

## 2017-06-08 MED ORDER — ONDANSETRON HCL 4 MG/2ML IJ SOLN
INTRAMUSCULAR | Status: AC
  Filled 2017-06-08: qty 2

## 2017-06-08 MED ORDER — FENTANYL CITRATE (PF) 100 MCG/2ML IJ SOLN
INTRAMUSCULAR | Status: AC
  Filled 2017-06-08: qty 2

## 2017-06-08 MED ORDER — LABETALOL HCL 5 MG/ML IV SOLN
20.0000 mg | INTRAVENOUS | Status: DC | PRN
  Administered 2017-06-08: 20 mg via INTRAVENOUS
  Administered 2017-06-08: 40 mg via INTRAVENOUS
  Filled 2017-06-08: qty 4
  Filled 2017-06-08: qty 8

## 2017-06-08 MED ORDER — ONDANSETRON HCL 4 MG/2ML IJ SOLN
4.0000 mg | Freq: Three times a day (TID) | INTRAMUSCULAR | Status: DC | PRN
  Administered 2017-06-08: 4 mg via INTRAVENOUS

## 2017-06-08 MED ORDER — CEFAZOLIN SODIUM-DEXTROSE 2-4 GM/100ML-% IV SOLN
2.0000 g | INTRAVENOUS | Status: AC
  Administered 2017-06-08: 2 g via INTRAVENOUS
  Filled 2017-06-08: qty 100

## 2017-06-08 MED ORDER — NALBUPHINE HCL 10 MG/ML IJ SOLN: 5.0000 mg | INTRAMUSCULAR | Status: DC | PRN

## 2017-06-08 MED ORDER — NALBUPHINE HCL 10 MG/ML IJ SOLN: 5.0000 mg | Freq: Once | INTRAMUSCULAR | Status: DC | PRN

## 2017-06-08 MED ORDER — PHENYLEPHRINE 8 MG IN D5W 100 ML (0.08MG/ML) PREMIX OPTIME
INJECTION | INTRAVENOUS | Status: AC
  Filled 2017-06-08: qty 100

## 2017-06-08 MED ORDER — SODIUM CHLORIDE 0.9% FLUSH: 3.0000 mL | INTRAVENOUS | Status: DC | PRN

## 2017-06-08 MED ORDER — PHENYLEPHRINE HCL 10 MG/ML IJ SOLN
INTRAMUSCULAR | Status: DC | PRN
  Administered 2017-06-08: 80 ug via INTRAVENOUS

## 2017-06-08 MED ORDER — OXYTOCIN 10 UNIT/ML IJ SOLN
INTRAMUSCULAR | Status: DC | PRN
  Administered 2017-06-08: 40 [IU] via INTRAVENOUS

## 2017-06-08 MED ORDER — BUPIVACAINE HCL (PF) 0.5 % IJ SOLN
INTRAMUSCULAR | Status: AC
  Filled 2017-06-08: qty 30

## 2017-06-08 MED ORDER — METOCLOPRAMIDE HCL 5 MG/ML IJ SOLN: 10.0000 mg | Freq: Once | INTRAMUSCULAR | Status: DC | PRN

## 2017-06-08 MED ORDER — LACTATED RINGERS IV SOLN
INTRAVENOUS | Status: DC
  Administered 2017-06-08: 21:00:00 via INTRAVENOUS

## 2017-06-08 MED ORDER — BUPIVACAINE IN DEXTROSE 0.75-8.25 % IT SOLN
INTRATHECAL | Status: DC | PRN
  Administered 2017-06-08: 1.4 mL via INTRATHECAL

## 2017-06-08 MED ORDER — FENTANYL CITRATE (PF) 100 MCG/2ML IJ SOLN
INTRAMUSCULAR | Status: DC | PRN
  Administered 2017-06-08: 20 ug via INTRATHECAL

## 2017-06-08 MED ORDER — DIPHENHYDRAMINE HCL 25 MG PO CAPS
25.0000 mg | ORAL_CAPSULE | ORAL | Status: DC | PRN
  Filled 2017-06-08: qty 1

## 2017-06-08 MED ORDER — HYDRALAZINE HCL 20 MG/ML IJ SOLN: 10.0000 mg | Freq: Once | INTRAMUSCULAR | Status: DC | PRN

## 2017-06-08 MED ORDER — OXYTOCIN 10 UNIT/ML IJ SOLN
INTRAMUSCULAR | Status: AC
  Filled 2017-06-08: qty 4

## 2017-06-08 MED ORDER — MEPERIDINE HCL 25 MG/ML IJ SOLN: 6.2500 mg | INTRAMUSCULAR | Status: DC | PRN

## 2017-06-08 MED ORDER — MORPHINE SULFATE (PF) 0.5 MG/ML IJ SOLN
INTRAMUSCULAR | Status: DC | PRN
  Administered 2017-06-08: .2 mg via EPIDURAL

## 2017-06-08 MED ORDER — NALBUPHINE HCL 10 MG/ML IJ SOLN
5.0000 mg | Freq: Once | INTRAMUSCULAR | Status: DC | PRN
  Filled 2017-06-08: qty 1

## 2017-06-08 MED ORDER — SOD CITRATE-CITRIC ACID 500-334 MG/5ML PO SOLN
ORAL | Status: AC
  Administered 2017-06-08: 30 mL via ORAL
  Filled 2017-06-08: qty 15

## 2017-06-08 MED ORDER — SCOPOLAMINE 1 MG/3DAYS TD PT72
MEDICATED_PATCH | TRANSDERMAL | Status: AC
  Filled 2017-06-08: qty 1

## 2017-06-08 MED ORDER — DIPHENHYDRAMINE HCL 50 MG/ML IJ SOLN
INTRAMUSCULAR | Status: AC
  Filled 2017-06-08: qty 1

## 2017-06-08 MED ORDER — ONDANSETRON HCL 4 MG/2ML IJ SOLN
INTRAMUSCULAR | Status: DC | PRN
  Administered 2017-06-08: 4 mg via INTRAVENOUS

## 2017-06-08 MED ORDER — SODIUM CHLORIDE 0.9 % IR SOLN
Status: DC | PRN
  Administered 2017-06-08: 1000 mL

## 2017-06-08 MED ORDER — FENTANYL CITRATE (PF) 100 MCG/2ML IJ SOLN: 25.0000 ug | INTRAMUSCULAR | Status: DC | PRN

## 2017-06-08 MED ORDER — DIPHENHYDRAMINE HCL 50 MG/ML IJ SOLN
12.5000 mg | INTRAMUSCULAR | Status: DC | PRN
  Administered 2017-06-08: 12.5 mg via INTRAVENOUS

## 2017-06-08 MED ORDER — LACTATED RINGERS IV SOLN
INTRAVENOUS | Status: DC
  Administered 2017-06-08: 17:00:00 via INTRAVENOUS

## 2017-06-08 MED ORDER — FAMOTIDINE IN NACL 20-0.9 MG/50ML-% IV SOLN
INTRAVENOUS | Status: AC
  Administered 2017-06-08: 20 mg
  Filled 2017-06-08: qty 50

## 2017-06-08 MED ORDER — SCOPOLAMINE 1 MG/3DAYS TD PT72
MEDICATED_PATCH | TRANSDERMAL | Status: DC | PRN
  Administered 2017-06-08: 1 via TRANSDERMAL

## 2017-06-08 MED ORDER — PHENYLEPHRINE 40 MCG/ML (10ML) SYRINGE FOR IV PUSH (FOR BLOOD PRESSURE SUPPORT)
PREFILLED_SYRINGE | INTRAVENOUS | Status: AC
  Filled 2017-06-08: qty 10

## 2017-06-08 MED ORDER — BUPIVACAINE HCL (PF) 0.5 % IJ SOLN
INTRAMUSCULAR | Status: DC | PRN
  Administered 2017-06-08: 25 mL

## 2017-06-08 MED ORDER — NALBUPHINE HCL 10 MG/ML IJ SOLN
5.0000 mg | INTRAMUSCULAR | Status: DC | PRN
  Administered 2017-06-09: 5 mg via INTRAVENOUS

## 2017-06-08 MED ORDER — NALOXONE HCL 0.4 MG/ML IJ SOLN: 0.4000 mg | INTRAMUSCULAR | Status: DC | PRN

## 2017-06-08 MED ORDER — NALOXONE HCL 4 MG/10ML IJ SOLN
1.0000 ug/kg/h | INTRAVENOUS | Status: DC | PRN
  Filled 2017-06-08: qty 5

## 2017-06-08 MED ORDER — MORPHINE SULFATE (PF) 0.5 MG/ML IJ SOLN
INTRAMUSCULAR | Status: AC
  Filled 2017-06-08: qty 10

## 2017-06-08 MED ORDER — MAGNESIUM SULFATE BOLUS VIA INFUSION
6.0000 g | Freq: Once | INTRAVENOUS | Status: AC
  Administered 2017-06-08: 6 g via INTRAVENOUS
  Filled 2017-06-08: qty 500

## 2017-06-08 MED ORDER — SOD CITRATE-CITRIC ACID 500-334 MG/5ML PO SOLN
30.0000 mL | ORAL | Status: AC
  Administered 2017-06-08: 30 mL via ORAL

## 2017-06-08 SURGICAL SUPPLY — 43 items
APL SKNCLS STERI-STRIP NONHPOA (GAUZE/BANDAGES/DRESSINGS) ×1
BENZOIN TINCTURE PRP APPL 2/3 (GAUZE/BANDAGES/DRESSINGS) ×3 IMPLANT
BLADE TIP J-PLASMA PRECISE LAP (MISCELLANEOUS) ×3 IMPLANT
CHLORAPREP W/TINT 26ML (MISCELLANEOUS) ×3 IMPLANT
CLAMP CORD UMBIL (MISCELLANEOUS) ×2 IMPLANT
CLOSURE WOUND 1/2 X4 (GAUZE/BANDAGES/DRESSINGS) ×1
CLOTH BEACON ORANGE TIMEOUT ST (SAFETY) ×3 IMPLANT
DRSG OPSITE POSTOP 4X10 (GAUZE/BANDAGES/DRESSINGS) ×3 IMPLANT
ELECT REM PT RETURN 9FT ADLT (ELECTROSURGICAL) ×3
ELECTRODE REM PT RTRN 9FT ADLT (ELECTROSURGICAL) ×1 IMPLANT
EXTRACTOR VACUUM KIWI (MISCELLANEOUS) IMPLANT
GAUZE SPONGE 4X4 12PLY STRL (GAUZE/BANDAGES/DRESSINGS) ×4 IMPLANT
GLOVE BIO SURGEON STRL SZ 6 (GLOVE) ×2 IMPLANT
GLOVE BIO SURGEON STRL SZ7 (GLOVE) ×5 IMPLANT
GLOVE BIOGEL PI IND STRL 6.5 (GLOVE) IMPLANT
GLOVE BIOGEL PI IND STRL 7.0 (GLOVE) ×2 IMPLANT
GLOVE BIOGEL PI INDICATOR 6.5 (GLOVE) ×2
GLOVE BIOGEL PI INDICATOR 7.0 (GLOVE) ×8
GOWN STRL REUS W/TWL LRG LVL3 (GOWN DISPOSABLE) ×6 IMPLANT
GOWN STRL REUS W/TWL XL LVL3 (GOWN DISPOSABLE) ×3 IMPLANT
KIT ABG SYR 3ML LUER SLIP (SYRINGE) IMPLANT
NDL HYPO 25X5/8 SAFETYGLIDE (NEEDLE) IMPLANT
NEEDLE HYPO 22GX1.5 SAFETY (NEEDLE) ×3 IMPLANT
NEEDLE HYPO 25X5/8 SAFETYGLIDE (NEEDLE) IMPLANT
NS IRRIG 1000ML POUR BTL (IV SOLUTION) ×3 IMPLANT
PACK C SECTION WH (CUSTOM PROCEDURE TRAY) ×3 IMPLANT
PAD ABD 7.5X8 STRL (GAUZE/BANDAGES/DRESSINGS) ×2 IMPLANT
PAD OB MATERNITY 4.3X12.25 (PERSONAL CARE ITEMS) ×3 IMPLANT
PENCIL SMOKE EVAC W/HOLSTER (ELECTROSURGICAL) ×3 IMPLANT
RTRCTR C-SECT PINK 25CM LRG (MISCELLANEOUS) IMPLANT
SPONGE LAP 18X18 X RAY DECT (DISPOSABLE) ×2 IMPLANT
SPONGE SURGIFOAM ABS GEL 12-7 (HEMOSTASIS) IMPLANT
STRIP CLOSURE SKIN 1/2X4 (GAUZE/BANDAGES/DRESSINGS) ×2 IMPLANT
SUT PDS AB 0 CTX 60 (SUTURE) IMPLANT
SUT PLAIN 0 NONE (SUTURE) IMPLANT
SUT SILK 0 TIES 10X30 (SUTURE) IMPLANT
SUT VIC AB 0 CT1 36 (SUTURE) ×9 IMPLANT
SUT VIC AB 3-0 CT1 27 (SUTURE) ×3
SUT VIC AB 3-0 CT1 TAPERPNT 27 (SUTURE) ×1 IMPLANT
SUT VIC AB 4-0 KS 27 (SUTURE) IMPLANT
SYR CONTROL 10ML LL (SYRINGE) ×3 IMPLANT
TOWEL OR 17X24 6PK STRL BLUE (TOWEL DISPOSABLE) ×3 IMPLANT
TRAY FOLEY BAG SILVER LF 14FR (SET/KITS/TRAYS/PACK) ×3 IMPLANT

## 2017-06-08 NOTE — MAU Note (Signed)
Pt sent to MAU from MD for BP evaluation.  Denies H/A, visual disturbances, denies epigastric pain. Denies VB or LOF.  Reports +FM.

## 2017-06-08 NOTE — Op Note (Signed)
Cesarean Section Operative Report  Alexis Blanchard  PROCEDURE DATE: 06/08/2017  PREOPERATIVE DIAGNOSES: Intrauterine pregnancy at 7830w1d weeks gestation; Pre-eclampsia with severe features; breech presentation.  POSTOPERATIVE DIAGNOSES: Intrauterine pregnancy at 2930w1d weeks gestation; Pre-eclampsia with severe features  PROCEDURE: Primary Low Transverse Cesarean Section  SURGEON:   Surgeon(s) and Role:    * Willodean RosenthalHarraway-Smith, Carolyn, MD - Primary    * Avid Guillette, Kandra NicolasJulie P, MD - Fellow   INDICATIONS: Alexis Blanchard is a 22 y.o. G2P0010 at 1930w1d here for cesarean section secondary to the indications listed under preoperative diagnoses; please see preoperative note for further details.  The risks of cesarean section were discussed with the patient including but were not limited to: bleeding which may require transfusion or reoperation; infection which may require antibiotics; injury to bowel, bladder, ureters or other surrounding organs; injury to the fetus; need for additional procedures including hysterectomy in the event of a life-threatening hemorrhage; placental abnormalities wth subsequent pregnancies, incisional problems, thromboembolic phenomenon and other postoperative/anesthesia complications.   The patient concurred with the proposed plan, giving informed written consent for the procedure.    FINDINGS:  Viable female infant in cephalic presentation.  Apgars 9 and 9.  Clear amniotic fluid.  Intact placenta, three vessel cord.  Normal uterus, fallopian tubes and ovaries bilaterally.  ANESTHESIA: Spinal INTRAVENOUS FLUIDS: 1400 ml ESTIMATED BLOOD LOSS: 1136 mL URINE OUTPUT:  550 ml SPECIMENS: Placenta sent to pathology COMPLICATIONS: None immediate  PROCEDURE IN DETAIL:  The patient preoperatively received intravenous antibiotics and had sequential compression devices applied to her lower extremities.  She was then taken to the operating room where spinal anesthesia was administered and was  found to be adequate. She was then placed in a dorsal supine position with a leftward tilt, and prepped and draped in a sterile manner.  A foley catheter was placed into her bladder and attached to constant gravity.    After an adequate timeout was performed, a Pfannenstiel skin incision was made with scalpel and carried through to the underlying layer of fascia. The fascia was incised in the midline, and this incision was extended bilaterally using the Mayo scissors.  Kocher clamps were applied to the superior aspect of the fascial incision and the underlying rectus muscles were dissected off bluntly.  A similar process was carried out on the inferior aspect of the fascial incision. The rectus muscles were separated in the midline bluntly and the peritoneum was entered bluntly. Attention was turned to the lower uterine segment where a low transverse hysterotomy was made with a scalpel and extended bilaterally bluntly.  The infant was successfully delivered, the cord was clamped and cut after one minute, and the infant was handed over to the awaiting neonatology team. Uterine massage was then administered, and the placenta delivered intact with a three-vessel cord. The uterus was then cleared of clots and debris.  The hysterotomy was closed with 0 Vicryl in a running locked fashion, and an imbricating layer was also placed with 0 Vicryl.  The pelvis was cleared of all clot and debris. Hemostasis was confirmed on all surfaces.  The peritoneum and the rectus muscles were reapproximated using 0 Vicryl interrupted stitc. The fascia was then closed using 0 Vicryl in a running fashion.  The subcutaneous layer was irrigated, and 25 ml of 0.5% Marcaine was injected subcutaneously around the incision.  The skin was closed with a 4-0 Vicryl subcuticular stitch.   The patient tolerated the procedure well. Sponge, lap, instrument and needle counts were  correct x 3.  She was taken to the recovery room in stable condition.    Disposition: PACU - hemodynamically stable.   Maternal Condition: stable    Signed: Frederik Pear, MD OB Fellow 06/08/2017 9:52 PM

## 2017-06-08 NOTE — MAU Provider Note (Signed)
Chief Complaint:  Hypertension   First Provider Initiated Contact with Patient 06/08/17 1559      HPI: Alexis Blanchard is a 22 y.o. female with a history of gestational HTN, G2P0010 at 62w1dwho presents to maternity admissions for a Pre-E workup. She reports being at [facility] for NST where her BP was elevated. BP today in office was 159/109. She reports being in the office on 1/29 for a blood pressure check and her BP "was elevated really high". She denies HA, epigastric pain, or vision changes. She reports having some "tightness" more than 10 minutes apart, not painful. She reports good fetal movement, denies LOF, vaginal bleeding, vaginal itching/burning, urinary symptoms, h/a, dizziness, n/v, or fever/chills.   Past Medical History: Past Medical History:  Diagnosis Date  . Headache   . Medical history non-contributory     Past obstetric history: OB History  Gravida Para Term Preterm AB Living  2 0 0 0 1 0  SAB TAB Ectopic Multiple Live Births  1 0 0 0 0    # Outcome Date GA Lbr Len/2nd Weight Sex Delivery Anes PTL Lv  2 Current           1 SAB               Past Surgical History: Past Surgical History:  Procedure Laterality Date  . TONSILLECTOMY      Family History: Family History  Problem Relation Age of Onset  . Hypertension Mother   . Heart disease Maternal Aunt        CHF  . Diabetes Maternal Grandmother   . Hypertension Maternal Grandmother   . Heart disease Maternal Grandmother   . Cancer Maternal Aunt 45       colon  . Cancer Maternal Aunt 45       throat  . Congestive Heart Failure Maternal Uncle     Social History: Social History   Tobacco Use  . Smoking status: Never Smoker  . Smokeless tobacco: Never Used  Substance Use Topics  . Alcohol use: Yes    Comment: pt states that she has not used since finding out about the pregnancy  . Drug use: Yes    Types: Marijuana    Comment: pt states that she has not used since finding out about the  pregnancy    Allergies: No Known Allergies  Meds:  Medications Prior to Admission  Medication Sig Dispense Refill Last Dose  . ACCU-CHEK FASTCLIX LANCETS MISC Check blood sugars 4 times a day.  024.410 100 each 12 Taking  . aspirin EC 81 MG tablet Take 1 tablet (81 mg total) by mouth daily. 60 tablet 3 Taking  . glucose blood test strip Check sugar 4 times a day 250.00 100 each 12 Taking  . Prenat w/o A Vit-FeFum-FePo-FA (CONCEPT OB) 130-92.4-1 MG CAPS Take 1 tablet by mouth daily. 30 capsule 12 Taking    ROS:  Review of Systems  Constitutional: Negative.   Respiratory: Negative.   Cardiovascular: Negative.   Gastrointestinal: Negative.   Genitourinary: Negative.   Musculoskeletal: Negative.   Neurological: Negative.   Psychiatric/Behavioral: Negative.   Positive for Hypertension   I have reviewed patient's Past Medical Hx, Surgical Hx, Family Hx, Social Hx, medications and allergies.   Physical Exam   Patient Vitals for the past 24 hrs:  BP Temp Temp src Pulse Resp SpO2 Height Weight  06/08/17 1853 - - - - - 100 % - -  06/08/17 1845 (!) 137/92 - -  88 - - - -  06/08/17 1833 - - - - - 100 % - -  06/08/17 1830 133/86 - - 99 20 - - -  06/08/17 1818 - - - - - 99 % - -  06/08/17 1815 112/67 - - 89 - - - -  06/08/17 1812 127/80 - - 89 18 - - -  06/08/17 1805 - - - - - 99 % - -  06/08/17 1803 133/87 - - (!) 110 20 - - -  06/08/17 1748 130/85 - - (!) 101 18 100 % - -  06/08/17 1730 (!) 147/113 - - 94 - - - -  06/08/17 1715 (!) 146/105 - - 86 - 100 % - -  06/08/17 1700 (!) 154/103 - - 93 - 100 % - -  06/08/17 1645 (!) 162/116 - - 84 - 100 % - -  06/08/17 1630 (!) 159/111 - - (!) 104 - 99 % - -  06/08/17 1620 (!) 148/107 - - 97 - 99 % - -  06/08/17 1608 - - - - - 97 % - -  06/08/17 1600 (!) 143/107 - - 95 - - - -  06/08/17 1547 (!) 137/104 - - 93 - - - -  06/08/17 1525 (!) 146/95 98 F (36.7 C) Oral (!) 111 18 96 % 5\' 1"  (1.549 m) 122 lb (55.3 kg)   Constitutional:  Well-developed, well-nourished female in no acute distress.  Cardiovascular: normal rate Respiratory: normal effort GI: Abd soft, non-tender, gravid appropriate for gestational age.  MS: Extremities nontender, no edema, normal ROM Neurologic: Alert and oriented x 4.  GU: Neg CVAT.  CERVICAL EXAM: breech by [place] on 06/06/17 Dilation: Closed Effacement (%): Thick Cervical Position: Posterior Station: -3 Exam by:: Lanice Shirts, CNM  FHT:  Baseline 145 , moderate variability, accelerations present, no decelerations Contractions: 2-60minutes/ mild/ 60-80   Labs: Results for orders placed or performed during the hospital encounter of 06/08/17 (from the past 24 hour(s))  Urinalysis, Routine w reflex microscopic     Status: Abnormal   Collection Time: 06/08/17  3:30 PM  Result Value Ref Range   Color, Urine COLORLESS (A) YELLOW   APPearance CLEAR CLEAR   Specific Gravity, Urine 1.002 (L) 1.005 - 1.030   pH 7.0 5.0 - 8.0   Glucose, UA NEGATIVE NEGATIVE mg/dL   Hgb urine dipstick NEGATIVE NEGATIVE   Bilirubin Urine NEGATIVE NEGATIVE   Ketones, ur NEGATIVE NEGATIVE mg/dL   Protein, ur NEGATIVE NEGATIVE mg/dL   Nitrite NEGATIVE NEGATIVE   Leukocytes, UA NEGATIVE NEGATIVE  Protein / creatinine ratio, urine     Status: None   Collection Time: 06/08/17  4:00 PM  Result Value Ref Range   Creatinine, Urine 15.00 mg/dL   Total Protein, Urine <6 mg/dL   Protein Creatinine Ratio        0.00 - 0.15 mg/mg[Cre]  CBC     Status: Abnormal   Collection Time: 06/08/17  4:09 PM  Result Value Ref Range   WBC 11.3 (H) 4.0 - 10.5 K/uL   RBC 4.16 3.87 - 5.11 MIL/uL   Hemoglobin 9.8 (L) 12.0 - 15.0 g/dL   HCT 16.1 (L) 09.6 - 04.5 %   MCV 70.7 (L) 78.0 - 100.0 fL   MCH 23.6 (L) 26.0 - 34.0 pg   MCHC 33.3 30.0 - 36.0 g/dL   RDW 40.9 81.1 - 91.4 %   Platelets 393 150 - 400 K/uL  Comprehensive metabolic panel     Status:  Abnormal   Collection Time: 06/08/17  4:09 PM  Result Value Ref Range   Sodium 131  (L) 135 - 145 mmol/L   Potassium 3.4 (L) 3.5 - 5.1 mmol/L   Chloride 101 101 - 111 mmol/L   CO2 19 (L) 22 - 32 mmol/L   Glucose, Bld 84 65 - 99 mg/dL   BUN 10 6 - 20 mg/dL   Creatinine, Ser 4.09 0.44 - 1.00 mg/dL   Calcium 8.9 8.9 - 81.1 mg/dL   Total Protein 7.0 6.5 - 8.1 g/dL   Albumin 3.0 (L) 3.5 - 5.0 g/dL   AST 25 15 - 41 U/L   ALT 18 14 - 54 U/L   Alkaline Phosphatase 166 (H) 38 - 126 U/L   Total Bilirubin 0.5 0.3 - 1.2 mg/dL   GFR calc non Af Amer >60 >60 mL/min   GFR calc Af Amer >60 >60 mL/min   Anion gap 11 5 - 15   B/Positive/-- (09/10 1511)  Imaging:  [place] Mfm Fetal Bpp Wo Non Stress  Result Date: 06/06/2017 ----------------------------------------------------------------------  OBSTETRICS REPORT                      (Signed Final 06/06/2017 04:37 pm) ---------------------------------------------------------------------- Patient Info  ID #:       [id number]                          D.O.B.:  1995/08/15 (22 yrs)  Name:       Alexis Blanchard                Visit Date: 06/06/2017 03:50 pm ---------------------------------------------------------------------- Performed By  Performed By:     Lenise Arena        Ref. Address:     [address]                                                             [place], [place]                                                             [address]  Attending:        Charlsie Merles MD         Location:         [facility]  Referred By:      Reva Bores                    MD ---------------------------------------------------------------------- Orders   #  Description  Code   1  [place] MFM OB FOLLOW UP                         [id number]   2  [place] MFM FETAL BPP WO NON STRESS              76819.01  ----------------------------------------------------------------------   #  Ordered By               Order #        Accession #    Episode #   1   Jaynie Collins           [id number]      [id number]     [id number]   2  Jaynie Collins           [id number]      [id number]     [id number]  ---------------------------------------------------------------------- Indications   [redacted] weeks gestation of pregnancy                Z3A.33   Hypertension - Gestational-asa                 O13.9   Gestational diabetes in pregnancy, diet        O24.410   controlled  ---------------------------------------------------------------------- OB History  Blood Type:            Height:  5'2"   Weight (lb):  107       BMI:  19.57  Gravidity:    2         Term:   0        Prem:   0        SAB:   1  TOP:          0       Ectopic:  0        Living: 0 ---------------------------------------------------------------------- Fetal Evaluation  Num Of Fetuses:     1  Fetal Heart         141  Rate(bpm):  Cardiac Activity:   Observed  Presentation:       Breech  Amniotic Fluid  AFI FV:      Subjectively within normal limits  AFI Sum(cm)     %Tile       Largest Pocket(cm)  20.48           77          7.09  RUQ(cm)       RLQ(cm)       LUQ(cm)        LLQ(cm)  7.09          4.14          5.99           3.26 ---------------------------------------------------------------------- Biophysical Evaluation  Amniotic F.V:   Within normal limits       F. Tone:        Observed  F. Movement:    Observed                   Score:          8/8  F. Breathing:   Observed ---------------------------------------------------------------------- Biometry  BPD:      86.6  mm     G. Age:  35w 0d         77  %    CI:        76.75   %    70 -  86                                                          FL/HC:      18.5   %    19.4 - 21.8  HC:      313.1  mm     G. Age:  35w 1d         45  %    HC/AC:      1.00        0.96 - 1.11  AC:      313.5  mm     G. Age:  35w 2d         87  %    FL/BPD:     66.9   %    71 - 87  FL:       57.9  mm     G. Age:  30w 2d        < 3  %    FL/AC:      18.5   %    20 - 24  HUM:      52.2  mm     G. Age:   30w 3d        < 5  %  Est. FW:    2300  gm      5 lb 1 oz     60  % ---------------------------------------------------------------------- Gestational Age  LMP:           35w 6d        Date:  09/28/16                 EDD:   07/05/17  U/S Today:     34w 0d                                        EDD:   07/18/17  Best:          33w 6d     Det. By:  Previous Ultrasound      EDD:   07/19/17                                      (01/16/17) ---------------------------------------------------------------------- Anatomy  Thoracic:              Appears normal         Abdomen:                Appears normal  Heart:                 Appears normal         Bladder:                Appears normal                         (4CH, axis, and situs  Stomach:               Appears normal, left  sided ---------------------------------------------------------------------- Cervix Uterus Adnexa  Cervix  Not visualized (advanced GA >29wks) ---------------------------------------------------------------------- Impression  Singleton intrauterine pregnancy at 33+6 weeks with GDM  and gestational HTN here for growth and BPP  Interval review of the anatomy shows no sonographic  markers for aneuploidy or structural anomalies  All relevant fetal anatomy has been visualized  Amniotic fluid volume is normal  Estimated fetal weight shows growth in the 60th percentile  BPP 8/8 ---------------------------------------------------------------------- Recommendations  Recheck growth in 4 weeks  Continue weekly BPP ----------------------------------------------------------------------                 Charlsie MerlesMark Newman, MD Electronically Signed Final Report   06/06/2017 04:37 pm ----------------------------------------------------------------------  MAU Course/MDM: Orders Placed This Encounter  Procedures  . Urinalysis, Routine w reflex microscopic  . CBC  . Comprehensive metabolic panel  . Protein / creatinine ratio, urine  Pre-E labs  normal   NST reviewed Consult Dr Jolayne Pantheronstant and Dr Erin FullingHarraway-Smith with presentation, exam findings, test results, severe BPs. Recommend delivery by C/S due to breech presentation and severe preeclampsia. BMZ given on 06/06/2017.  Treatments in MAU included 20mg  labetalol IV. Magnesium started in MAU after diagnoses of severe preeclampsia.     Assessment: 1. Preeclampsia with severe features due to severe range BP greater than 4 hours apart   Plan: C/S due to breech position of fetus  Care transferred to Dr. Erin FullingHarraway Smith, orders placed by Dr Jolayne Pantheronstant    Steward DroneVeronica Kalisha Keadle Certified Nurse-Midwife 06/08/2017 5:51 PM

## 2017-06-08 NOTE — Transfer of Care (Signed)
Immediate Anesthesia Transfer of Care Note  Patient: Alexis MonsNatalya S Blanchard  Procedure(s) Performed: CESAREAN SECTION (N/A )  Patient Location: PACU  Anesthesia Type:Spinal  Level of Consciousness: awake and alert   Airway & Oxygen Therapy: Patient Spontanous Breathing  Post-op Assessment: Report given to RN and Post -op Vital signs reviewed and stable  Post vital signs: Reviewed and stable  Last Vitals:  Vitals:   06/08/17 1930 06/08/17 2000  BP: 134/82 (!) 141/96  Pulse: 95 89  Resp:  19  Temp: 36.7 C   SpO2:      Last Pain:  Vitals:   06/08/17 1930  TempSrc: Oral  PainSc:          Complications: No apparent anesthesia complications

## 2017-06-08 NOTE — Anesthesia Postprocedure Evaluation (Signed)
Anesthesia Post Note  Patient: Alexis Blanchard  Procedure(s) Performed: CESAREAN SECTION (N/A )     Patient location during evaluation: PACU Anesthesia Type: Spinal Level of consciousness: awake and alert and oriented Pain management: pain level controlled Vital Signs Assessment: post-procedure vital signs reviewed and stable Respiratory status: spontaneous breathing, nonlabored ventilation and respiratory function stable Cardiovascular status: blood pressure returned to baseline and stable Postop Assessment: no headache, no backache, spinal receding and no apparent nausea or vomiting Anesthetic complications: no    Last Vitals:  Vitals:   06/08/17 2215 06/08/17 2230  BP: 110/78 109/75  Pulse: 87 81  Resp: 18 (!) 22  Temp: (!) 36.2 C (!) 36.2 C  SpO2: 100% 100%    Last Pain:  Vitals:   06/08/17 2230  TempSrc:   PainSc: 0-No pain   Pain Goal:                 Savana Spina A.

## 2017-06-08 NOTE — Anesthesia Preprocedure Evaluation (Addendum)
Anesthesia Evaluation  Patient identified by MRN, date of birth, ID band Patient awake    Reviewed: Allergy & Precautions, NPO status , Patient's Chart, lab work & pertinent test results  Airway Mallampati: III  TM Distance: >3 FB Neck ROM: Full    Dental no notable dental hx. (+) Teeth Intact   Pulmonary neg pulmonary ROS,    Pulmonary exam normal breath sounds clear to auscultation       Cardiovascular hypertension, Pt. on medications Normal cardiovascular exam Rhythm:Regular Rate:Normal - Carotid Bruit Patient given Labetalol IV for HTN   Neuro/Psych  Headaches, negative psych ROS   GI/Hepatic Neg liver ROS, GERD  ,Nausea and vomiting today Epigastric pain   Endo/Other  diabetes, Well Controlled, GestationalDiet controlled  Renal/GU negative Renal ROS  negative genitourinary   Musculoskeletal negative musculoskeletal ROS (+)   Abdominal   Peds  Hematology  (+) anemia , Alpha Thalassemia trait   Anesthesia Other Findings   Reproductive/Obstetrics (+) Pregnancy Pre eclampsia - Severe 34 weeks                            Anesthesia Physical Anesthesia Plan  ASA: III and emergent  Anesthesia Plan: Spinal   Post-op Pain Management:    Induction:   PONV Risk Score and Plan: Scopolamine patch - Pre-op, Dexamethasone, Ondansetron and Treatment may vary due to age or medical condition  Airway Management Planned: Natural Airway  Additional Equipment:   Intra-op Plan:   Post-operative Plan:   Informed Consent: I have reviewed the patients History and Physical, chart, labs and discussed the procedure including the risks, benefits and alternatives for the proposed anesthesia with the patient or authorized representative who has indicated his/her understanding and acceptance.   Dental advisory given  Plan Discussed with: CRNA, Anesthesiologist and Surgeon  Anesthesia Plan Comments:         Anesthesia Quick Evaluation

## 2017-06-08 NOTE — Progress Notes (Signed)
V. Aundria Rudogers, CNM @ bedside discussing POC.  POC includes initiating Magnesium Sulfate & Cesarean Section secondary breech presentation

## 2017-06-08 NOTE — Progress Notes (Signed)
Patient presented to the office today for NST. Patient reports feeling ok today she denies HA or blurry vision at this time.Her  blood pressure  today is 159/109 which is up from 150/103 on 06/07/2017. Discussed with Dr.Constant, who recommend patient go directly to MAU for work up. Patient is agreeable to plan at this time. NST has been documented.

## 2017-06-08 NOTE — Anesthesia Procedure Notes (Signed)
Spinal  Patient location during procedure: OR Start time: 06/08/2017 8:25 PM Staffing Anesthesiologist: Mal AmabileFoster, Armya Westerhoff, MD Performed: anesthesiologist  Preanesthetic Checklist Completed: patient identified, site marked, surgical consent, pre-op evaluation, timeout performed, IV checked, risks and benefits discussed and monitors and equipment checked Spinal Block Patient position: sitting Prep: site prepped and draped and DuraPrep Patient monitoring: heart rate, cardiac monitor, continuous pulse ox and blood pressure Approach: midline Location: L3-4 Injection technique: single-shot Needle Needle type: Pencan  Needle gauge: 24 G Needle length: 9 cm Needle insertion depth: 4 cm Assessment Sensory level: T4 Additional Notes Patient tolerated procedure well. Adequate sensory level.

## 2017-06-09 ENCOUNTER — Other Ambulatory Visit: Payer: Self-pay

## 2017-06-09 ENCOUNTER — Encounter (HOSPITAL_COMMUNITY): Payer: Self-pay | Admitting: Obstetrics & Gynecology

## 2017-06-09 DIAGNOSIS — O1410 Severe pre-eclampsia, unspecified trimester: Secondary | ICD-10-CM

## 2017-06-09 DIAGNOSIS — O321XX0 Maternal care for breech presentation, not applicable or unspecified: Secondary | ICD-10-CM

## 2017-06-09 LAB — CBC
HCT: 22.3 % — ABNORMAL LOW (ref 36.0–46.0)
Hemoglobin: 7.5 g/dL — ABNORMAL LOW (ref 12.0–15.0)
MCH: 23.5 pg — ABNORMAL LOW (ref 26.0–34.0)
MCHC: 33.6 g/dL (ref 30.0–36.0)
MCV: 69.9 fL — AB (ref 78.0–100.0)
PLATELETS: 321 10*3/uL (ref 150–400)
RBC: 3.19 MIL/uL — AB (ref 3.87–5.11)
RDW: 15.2 % (ref 11.5–15.5)
WBC: 16.8 10*3/uL — AB (ref 4.0–10.5)

## 2017-06-09 LAB — RPR: RPR: NONREACTIVE

## 2017-06-09 MED ORDER — HYDRALAZINE HCL 20 MG/ML IJ SOLN: 10.0000 mg | Freq: Once | INTRAMUSCULAR | Status: DC | PRN

## 2017-06-09 MED ORDER — OXYCODONE HCL 5 MG PO TABS: 10.0000 mg | ORAL_TABLET | ORAL | Status: DC | PRN

## 2017-06-09 MED ORDER — DIPHENHYDRAMINE HCL 25 MG PO CAPS
25.0000 mg | ORAL_CAPSULE | Freq: Four times a day (QID) | ORAL | Status: DC | PRN
  Administered 2017-06-09 (×2): 25 mg via ORAL
  Filled 2017-06-09: qty 1

## 2017-06-09 MED ORDER — FERROUS SULFATE 325 (65 FE) MG PO TABS
325.0000 mg | ORAL_TABLET | Freq: Three times a day (TID) | ORAL | Status: DC
  Administered 2017-06-09 (×2): 325 mg via ORAL
  Filled 2017-06-09 (×3): qty 1

## 2017-06-09 MED ORDER — SIMETHICONE 80 MG PO CHEW: 80.0000 mg | CHEWABLE_TABLET | ORAL | Status: DC | PRN

## 2017-06-09 MED ORDER — SIMETHICONE 80 MG PO CHEW
80.0000 mg | CHEWABLE_TABLET | ORAL | Status: DC
  Administered 2017-06-09 (×2): 80 mg via ORAL
  Filled 2017-06-09 (×2): qty 1

## 2017-06-09 MED ORDER — WITCH HAZEL-GLYCERIN EX PADS: 1.0000 "application " | MEDICATED_PAD | CUTANEOUS | Status: DC | PRN

## 2017-06-09 MED ORDER — SIMETHICONE 80 MG PO CHEW
80.0000 mg | CHEWABLE_TABLET | Freq: Three times a day (TID) | ORAL | Status: DC
  Administered 2017-06-09 – 2017-06-10 (×5): 80 mg via ORAL
  Filled 2017-06-09 (×6): qty 1

## 2017-06-09 MED ORDER — LABETALOL HCL 5 MG/ML IV SOLN: 20.0000 mg | INTRAVENOUS | Status: DC | PRN

## 2017-06-09 MED ORDER — COCONUT OIL OIL
1.0000 "application " | TOPICAL_OIL | Status: DC | PRN
  Filled 2017-06-09: qty 120

## 2017-06-09 MED ORDER — MAGNESIUM SULFATE 40 G IN LACTATED RINGERS - SIMPLE: 2.0000 g/h | INTRAVENOUS | Status: DC

## 2017-06-09 MED ORDER — PRENATAL MULTIVITAMIN CH
1.0000 | ORAL_TABLET | Freq: Every day | ORAL | Status: DC
  Administered 2017-06-09 – 2017-06-10 (×2): 1 via ORAL
  Filled 2017-06-09 (×2): qty 1

## 2017-06-09 MED ORDER — ACETAMINOPHEN 325 MG PO TABS
650.0000 mg | ORAL_TABLET | ORAL | Status: DC | PRN
Start: 2017-06-09 — End: 2017-06-11

## 2017-06-09 MED ORDER — SENNOSIDES-DOCUSATE SODIUM 8.6-50 MG PO TABS
2.0000 | ORAL_TABLET | ORAL | Status: DC
  Administered 2017-06-09 (×2): 2 via ORAL
  Filled 2017-06-09 (×2): qty 2

## 2017-06-09 MED ORDER — IBUPROFEN 600 MG PO TABS
600.0000 mg | ORAL_TABLET | Freq: Four times a day (QID) | ORAL | Status: DC
  Administered 2017-06-09 – 2017-06-11 (×9): 600 mg via ORAL
  Filled 2017-06-09 (×9): qty 1

## 2017-06-09 MED ORDER — OXYTOCIN 40 UNITS IN LACTATED RINGERS INFUSION - SIMPLE MED: 2.5000 [IU]/h | INTRAVENOUS | Status: AC

## 2017-06-09 MED ORDER — MENTHOL 3 MG MT LOZG
1.0000 | LOZENGE | OROMUCOSAL | Status: DC | PRN
  Administered 2017-06-11: 3 mg via ORAL
  Filled 2017-06-09: qty 9

## 2017-06-09 MED ORDER — MAGNESIUM SULFATE 40 G IN LACTATED RINGERS - SIMPLE
2.0000 g/h | INTRAVENOUS | Status: AC
  Administered 2017-06-09: 2 g/h via INTRAVENOUS
  Filled 2017-06-09: qty 500
  Filled 2017-06-09: qty 40

## 2017-06-09 MED ORDER — OXYCODONE HCL 5 MG PO TABS
5.0000 mg | ORAL_TABLET | ORAL | Status: DC | PRN
  Administered 2017-06-10: 5 mg via ORAL
  Filled 2017-06-09: qty 1

## 2017-06-09 MED ORDER — TETANUS-DIPHTH-ACELL PERTUSSIS 5-2.5-18.5 LF-MCG/0.5 IM SUSP: 0.5000 mL | Freq: Once | INTRAMUSCULAR | Status: DC

## 2017-06-09 MED ORDER — DIBUCAINE 1 % RE OINT: 1.0000 "application " | TOPICAL_OINTMENT | RECTAL | Status: DC | PRN

## 2017-06-09 MED ORDER — ZOLPIDEM TARTRATE 5 MG PO TABS: 5.0000 mg | ORAL_TABLET | Freq: Every evening | ORAL | Status: DC | PRN

## 2017-06-09 MED ORDER — LACTATED RINGERS IV SOLN
INTRAVENOUS | Status: DC
  Administered 2017-06-09 (×2): via INTRAVENOUS

## 2017-06-09 NOTE — Anesthesia Postprocedure Evaluation (Signed)
Anesthesia Post Note  Patient: Alexis Blanchard  Procedure(s) Performed: CESAREAN SECTION (N/A )     Patient location during evaluation: [facility] Anesthesia Type: Spinal Level of consciousness: oriented and awake and alert Pain management: pain level controlled Vital Signs Assessment: post-procedure vital signs reviewed and stable Respiratory status: spontaneous breathing, respiratory function stable and patient connected to nasal cannula oxygen Cardiovascular status: blood pressure returned to baseline and stable Postop Assessment: no headache, no backache, no apparent nausea or vomiting and patient able to bend at knees Anesthetic complications: no    Last Vitals:  Vitals:   06/09/17 0401 06/09/17 0501  BP: 109/72 114/60  Pulse: 69 77  Resp: 20 18  Temp:    SpO2: 100%     Last Pain:  Vitals:   06/09/17 0700  TempSrc:   PainSc: Asleep   Pain Goal:                 Rica RecordsICKELTON,Kwinton Maahs

## 2017-06-09 NOTE — Plan of Care (Signed)
  Activity: Will verbalize the importance of balancing activity with adequate rest periods 06/09/2017 0202 - Progressing by Bobbye MortonAcheampong, Rome Echavarria, RN 06/09/2017 0123 - Progressing by Bobbye MortonAcheampong, Leliana Kontz, RN

## 2017-06-09 NOTE — Plan of Care (Signed)
  Education: Knowledge of condition will improve 06/09/2017 0123 - Progressing by Bobbye MortonAcheampong, Jaceyon Strole, RN   Activity: Will verbalize the importance of balancing activity with adequate rest periods 06/09/2017 0123 - Progressing by Bobbye MortonAcheampong, Josel Keo, RN   Coping: Ability to identify and utilize available resources and services will improve 06/09/2017 0123 - Progressing by Bobbye MortonAcheampong, Zvi Duplantis, RN   Skin Integrity: Demonstration of wound healing without infection will improve 06/09/2017 0123 - Progressing by Bobbye MortonAcheampong, Melanie Pellot, RN

## 2017-06-09 NOTE — Addendum Note (Signed)
Addendum  created 06/09/17 [id number] by Rica Recordsickelton, Tewana Bohlen, CRNA   Sign clinical note

## 2017-06-09 NOTE — Progress Notes (Signed)
1 Day Post-Op Procedure(s) (LRB): CESAREAN SECTION (N/A)  Subjective: Patient reports feeling well. She reports minimal pain this morning. She denies any HA, visual changes, RUQ/epigastric pain.    Objective: I have reviewed patient's vital signs, intake and output, medications and labs. Blood pressure 124/77, pulse 74, temperature 98.6 F (37 C), temperature source Oral, resp. rate 18, height 5\' 1"  (1.549 m), weight 114 lb (51.7 kg), last menstrual period 09/28/2016, SpO2 99 %, unknown if currently breastfeeding.  Intake/Output Summary (Last 24 hours) at 06/09/2017 0922 Last data filed at 06/09/2017 0755 Gross per 24 hour  Intake 2361.67 ml  Output 4061 ml  Net -1699.33 ml   CBC Latest Ref Rng & Units 06/09/2017 06/08/2017 06/05/2017  WBC 4.0 - 10.5 K/uL 16.8(H) 11.3(H) 13.5(H)  Hemoglobin 12.0 - 15.0 g/dL 7.5(L) 9.8(L) 10.4(L)  Hematocrit 36.0 - 46.0 % 22.3(L) 29.4(L) 32.4(L)  Platelets 150 - 400 K/uL 321 393 373    General: alert, cooperative and no distress Resp: clear to auscultation bilaterally Cardio: regular rate and rhythm GI: incision: 50 % of honeycomb dressing stained with dark blood  and abdomen soft, non distended and appropriately tender Extremities: extremities normal, atraumatic, no cyanosis or edema and Homans sign is negative, no sign of DVT  Assessment: s/p Procedure(s): CESAREAN SECTION (N/A): stable  Plan: Encourage ambulation  Will discontinue magnesium sulfate for seizure prophylaxis this evening BP significantly improved. Continue close monitoring Will start iron supplements   LOS: 1 day    Alexis Blanchard 06/09/2017, 9:17 AM

## 2017-06-10 LAB — CBC
HCT: 18.4 % — ABNORMAL LOW (ref 36.0–46.0)
HEMOGLOBIN: 6.1 g/dL — AB (ref 12.0–15.0)
MCH: 23.6 pg — ABNORMAL LOW (ref 26.0–34.0)
MCHC: 33.2 g/dL (ref 30.0–36.0)
MCV: 71 fL — ABNORMAL LOW (ref 78.0–100.0)
Platelets: 256 10*3/uL (ref 150–400)
RBC: 2.59 MIL/uL — ABNORMAL LOW (ref 3.87–5.11)
RDW: 15.4 % (ref 11.5–15.5)
WBC: 16.5 10*3/uL — ABNORMAL HIGH (ref 4.0–10.5)

## 2017-06-10 LAB — PREPARE RBC (CROSSMATCH)

## 2017-06-10 MED ORDER — SENNOSIDES-DOCUSATE SODIUM 8.6-50 MG PO TABS: 2.0000 | ORAL_TABLET | Freq: Every evening | ORAL | Status: DC | PRN

## 2017-06-10 MED ORDER — ACETAMINOPHEN 325 MG PO TABS
650.0000 mg | ORAL_TABLET | Freq: Once | ORAL | Status: AC
Start: 2017-06-10 — End: 2017-06-10
  Administered 2017-06-10: 650 mg via ORAL
  Filled 2017-06-10: qty 2

## 2017-06-10 MED ORDER — POLYETHYLENE GLYCOL 3350 17 G PO PACK
17.0000 g | PACK | Freq: Every day | ORAL | Status: DC
  Filled 2017-06-10: qty 1

## 2017-06-10 MED ORDER — SODIUM CHLORIDE 0.9 % IV SOLN: Freq: Once | INTRAVENOUS | Status: DC

## 2017-06-10 MED ORDER — FERROUS SULFATE 325 (65 FE) MG PO TABS
325.0000 mg | ORAL_TABLET | Freq: Two times a day (BID) | ORAL | Status: DC
  Administered 2017-06-10: 325 mg via ORAL
  Filled 2017-06-10: qty 1

## 2017-06-10 MED ORDER — DIPHENHYDRAMINE HCL 25 MG PO CAPS
25.0000 mg | ORAL_CAPSULE | Freq: Once | ORAL | Status: AC
  Administered 2017-06-10: 25 mg via ORAL
  Filled 2017-06-10: qty 1

## 2017-06-10 NOTE — Progress Notes (Signed)
Subjective: Postpartum Day 2: Cesarean Delivery Patient reports no problems this morning. Tolerating diet. Pain controlled. Denies HA or visual changes  Objective: Vital signs in last 24 hours: Temp:  [98.1 F (36.7 C)-98.6 F (37 C)] 98.1 F (36.7 C) (02/02 0803) Pulse Rate:  [78-100] 100 (02/02 0803) Resp:  [15-18] 16 (02/02 0803) BP: (117-141)/(64-80) 122/80 (02/02 0803) SpO2:  [99 %-100 %] 99 % (02/02 0803)  Physical Exam:  General: alert Lochia: appropriate Uterine Fundus: firm Incision: healing well DVT Evaluation: No evidence of DVT seen on physical exam.  Recent Labs    06/08/17 1609 06/09/17 0531  HGB 9.8* 7.5*  HCT 29.4* 22.3*    Assessment/Plan: Status post Cesarean section. Doing well postoperatively. BP stable off magnesium. Will continue to follow BP. Continue with progressive care   Hermina StaggersMichael L Martavius Lusty 06/10/2017, 9:46 AM

## 2017-06-10 NOTE — Progress Notes (Signed)
PP Note  Pt with asymptomatic worsening anemia. Normal lochia and dressing. D/w her and recommend 2U prbc given post op and postpartum status. Pt amenable to this. Will get post transfusion cbc.   Cornelia Copaharlie Rindy Kollman, Jr MD Attending [facility] 06/10/2017 Time: [phone]

## 2017-06-11 ENCOUNTER — Inpatient Hospital Stay (HOSPITAL_COMMUNITY)
Admission: AD | Admit: 2017-06-11 | Discharge: 2017-06-11 | Disposition: A | Discharge status: Home / Self Care | Payer: Medicaid Other | Source: Ambulatory Visit | Attending: Obstetrics & Gynecology | Admitting: Obstetrics & Gynecology

## 2017-06-11 DIAGNOSIS — Z98891 History of uterine scar from previous surgery: Secondary | ICD-10-CM

## 2017-06-11 DIAGNOSIS — G8918 Other acute postprocedural pain: Secondary | ICD-10-CM | POA: Insufficient documentation

## 2017-06-11 DIAGNOSIS — O9089 Other complications of the puerperium, not elsewhere classified: Secondary | ICD-10-CM | POA: Insufficient documentation

## 2017-06-11 LAB — TYPE AND SCREEN
ABO/RH(D): B POS
ANTIBODY SCREEN: NEGATIVE
UNIT DIVISION: 0
Unit division: 0

## 2017-06-11 LAB — CBC
HCT: 24.7 % — ABNORMAL LOW (ref 36.0–46.0)
HEMOGLOBIN: 8.3 g/dL — AB (ref 12.0–15.0)
MCH: 25.2 pg — AB (ref 26.0–34.0)
MCHC: 33.6 g/dL (ref 30.0–36.0)
MCV: 74.8 fL — AB (ref 78.0–100.0)
Platelets: 224 10*3/uL (ref 150–400)
RBC: 3.3 MIL/uL — ABNORMAL LOW (ref 3.87–5.11)
RDW: 16.3 % — ABNORMAL HIGH (ref 11.5–15.5)
WBC: 15.9 10*3/uL — ABNORMAL HIGH (ref 4.0–10.5)

## 2017-06-11 LAB — BPAM RBC
BLOOD PRODUCT EXPIRATION DATE: 201903012359
Blood Product Expiration Date: 201903012359
ISSUE DATE / TIME: 201902021705
ISSUE DATE / TIME: 201902022020
Unit Type and Rh: 5100
Unit Type and Rh: 5100

## 2017-06-11 MED ORDER — SIMETHICONE 80 MG PO CHEW
80.0000 mg | CHEWABLE_TABLET | Freq: Three times a day (TID) | ORAL | 0 refills | Status: DC
Start: 2017-06-11 — End: 2019-04-23

## 2017-06-11 MED ORDER — OXYCODONE-ACETAMINOPHEN 5-325 MG PO TABS: 1.0000 | ORAL_TABLET | ORAL | 0 refills | Status: DC | PRN

## 2017-06-11 MED ORDER — SENNOSIDES-DOCUSATE SODIUM 8.6-50 MG PO TABS: 2.0000 | ORAL_TABLET | Freq: Every evening | ORAL | 0 refills | Status: DC | PRN

## 2017-06-11 MED ORDER — FERROUS SULFATE 325 (65 FE) MG PO TABS: 325.0000 mg | ORAL_TABLET | Freq: Two times a day (BID) | ORAL | 1 refills | Status: DC

## 2017-06-11 MED ORDER — POLYETHYLENE GLYCOL 3350 17 G PO PACK: 17.0000 g | PACK | Freq: Every day | ORAL | 1 refills | Status: DC

## 2017-06-11 MED ORDER — MEASLES, MUMPS & RUBELLA VAC ~~LOC~~ INJ
0.5000 mL | INJECTION | Freq: Once | SUBCUTANEOUS | Status: AC
  Administered 2017-06-11: 0.5 mL via SUBCUTANEOUS
  Filled 2017-06-11: qty 0.5

## 2017-06-11 MED ORDER — OXYCODONE-ACETAMINOPHEN 7.5-325 MG PO TABS
1.0000 | ORAL_TABLET | Freq: Once | ORAL | Status: AC
  Administered 2017-06-11: 1 via ORAL
  Filled 2017-06-11: qty 1

## 2017-06-11 MED ORDER — IBUPROFEN 600 MG PO TABS: 600.0000 mg | ORAL_TABLET | Freq: Four times a day (QID) | ORAL | 0 refills | Status: DC | PRN

## 2017-06-11 NOTE — MAU Note (Signed)
Patient d/c today, having abdominal pain 5/10, has been in NICU since discharge no pain medication since.

## 2017-06-11 NOTE — Discharge Summary (Signed)
Obstetrical Discharge Summary  Date of Admission: 06/08/2017 Date of Discharge: 06/13/2017  Primary OB: [facility]  Gestational Age at Delivery: 3832w1d   Antepartum complications: GDM, GBS positive Reason for Admission: IOL for severe pre-eclampsia Date of Delivery: 06/08/17  Delivered By: Dr. Erin FullingHarraway-Smith Delivery Type: primary cesarean section, low transverse incision Intrapartum complications/course: None Anesthesia: spinal Placenta: Delivered and expressed via active management. Intact: yes. To pathology: yes.  Laceration: n/a Episiotomy: none EBL: 1136mL Baby: Liveborn female, APGARs 9/9, weight 2090 g.    Discharge Diagnosis: Delivered.   Postpartum course: Uncomplicated. Pt received 24 hours of PP Mg. Meeting all postop goals including flatus by day of discharge  Discharge Vital Signs:  Current Vital Signs 24h Vital Sign Ranges  T 98.4 F (36.9 C) Temp  Avg: 98.6 F (37 C)  Min: 97.7 F (36.5 C)  Max: 99.6 F (37.6 C)  BP 130/83 BP  Min: 119/70  Max: 146/81  HR 82 Pulse  Avg: 97.3  Min: 82  Max: 113  RR 15 Resp  Avg: 16.1  Min: 15  Max: 18  SaO2 100 % Not Delivered SpO2  Avg: 99.5 %  Min: 99 %  Max: 100 %       24 Hour I/O Current Shift I/O  Time Ins Outs 02/02 0701 - 02/03 0700 In: 724.7 [I.V.:25] Out: 2400 [Urine:2400] No intake/output data recorded.    Discharge Exam:  NAD Perineum: deferred Abdomen: firm fundus below the umbilicus, NTTP, mildly distended, +bowel sounds.  Incision c/d/i dressing RRR no MRGs CTAB Ext: no c/c/e   Disposition: Home  Rh Immune globulin given: not applicable Rubella vaccine given: yes  Contraception: undecided  Prenatal/Postnatal Panel: B POS//Rubella Not immune//Varicella Unknown//RPR negative//HIV negative/HepB Surface Ag negative//pap not applicable//breast pumping  Plan:  Lucien MonsNatalya S Yaden was discharged to home in good condition. Follow-up appointment with [facility] in 1 week for a bp  check visit  Future Appointments  Date Time Provider Department Center  06/15/2017  2:00 PM [facility] NURSE [facility]  07/04/2017  2:15 PM Anyanwu, Jethro BastosUgonna A, MD [facility]    Discharge Medications: Allergies as of 06/11/2017   No Known Allergies     Medication List    STOP taking these medications   ACCU-CHEK FASTCLIX LANCETS Misc   aspirin EC 81 MG tablet   glucose blood test strip     TAKE these medications   CONCEPT OB 130-92.4-1 MG Caps Take 1 tablet by mouth daily.   ferrous sulfate 325 (65 FE) MG tablet Take 1 tablet (325 mg total) by mouth 2 (two) times daily with a meal.   ibuprofen 600 MG tablet Commonly known as:  ADVIL,MOTRIN Take 1 tablet (600 mg total) by mouth every 6 (six) hours as needed.   oxyCODONE-acetaminophen 5-325 MG tablet Commonly known as:  PERCOCET/ROXICET Take 1-2 tablets by mouth every 4 (four) hours as needed.   polyethylene glycol packet Commonly known as:  MIRALAX / GLYCOLAX Take 17 g by mouth daily.   senna-docusate 8.6-50 MG tablet Commonly known as:  Senokot-S Take 2 tablets by mouth at bedtime as needed for mild constipation.   simethicone 80 MG chewable tablet Commonly known as:  MYLICON Chew 1 tablet (80 mg total) by mouth 3 (three) times daily after meals.       Cornelia Copaharlie Adonna Horsley, Jr. MD Attending [facility](Faculty Practice)

## 2017-06-11 NOTE — Lactation Note (Signed)
This note was copied from a baby's chart. Lactation Consultation Note  Patient Name: Alexis Blanchard: 06/11/2017 Reason for consult: Initial assessment;1st time breastfeeding;NICU baby;Late-preterm 34-36.6wks;Infant < 6lbs  Mom with NICU baby at 34w 1d; baby was receiving donor milk while mom was on Mg, she's a P1. Mom started pumping today, she pumped twice, every 4 hours, mom was about the leave her room to see her baby in NICU, she got 5 ounces of breast milk in her last pumping session, praised mom for her efforts. Also, stressed the importance of early and consistent pumping to establish a good milk supply. Reviewed hand expression, pumping tips, coconut oil and flange sizes. Discussed BF brochure, BF resources and pumping log, mom is aware of LC services and will contact us PRN.  Maternal Data Formula Feeding for Exclusion: Yes Reason for exclusion: Admission to Intensive Care Unit (ICU) post-partum Has patient been taught Hand Expression?: No Does the patient have breastfeeding experience prior to this delivery?: No  Feeding Feeding Type: Donor Breast Milk Length of feed: 60 min   Interventions Interventions: Breast feeding basics reviewed;Breast massage;Coconut oil;DEBP  Lactation Tools Discussed/Used Tools: Pump Breast pump type: Double-Electric Breast Pump WIC Program: Yes Pump Review: Setup, frequency, and cleaning;Milk Storage Initiated by:: RN   Consult Status Consult Status: PRN Follow-up type: Other (comment)(Baby in NICU)    Alexis Blanchard 06/11/2017, 1:02 PM

## 2017-06-11 NOTE — Discharge Instructions (Signed)

## 2017-06-11 NOTE — MAU Provider Note (Signed)
History  CSN: [id number] Arrival date and time: 06/11/17 1709  First Provider Initiated Contact with Patient 06/11/17 1720      Chief Complaint  Patient presents with  . Abdominal Pain    HPI: Alexis Blanchard is a 22 y.o. [phone] s/p LTCS 4 days who presents to maternity admissions reporting lower abdominal pain. She was discharged home this morning, but has not gone home yet, and has been in NICU seeing her baby all day. Has not taken any ibuprofen or percocet. She also reports concern for seeing a blood clot when going to the bathroom earlier today. Denies any other symptoms, including no fever, chills, nausea, vomiting or urinary symptoms. Has not has BM since surgery, but she is passing flatus.    OB History  Gravida Para Term Preterm AB Living  2 1 0 1 1 1   SAB TAB Ectopic Multiple Live Births  1 0 0 0 1    # Outcome Date GA Lbr Len/2nd Weight Sex Delivery Anes PTL Lv  2 Preterm 06/08/17 [redacted]w[redacted]d  4 lb 9.7 oz (2.09 kg) M CS-LTranv Spinal  LIV  1 SAB              Past Medical History:  Diagnosis Date  . Headache   . Medical history non-contributory    Past Surgical History:  Procedure Laterality Date  . CESAREAN SECTION N/A 06/08/2017   Procedure: CESAREAN SECTION;  Surgeon: Willodean Rosenthal, MD;  Location: [facility] BIRTHING SUITES;  Service: Obstetrics;  Laterality: N/A;  . TONSILLECTOMY     Social History   Socioeconomic History  . Marital status: Single    Spouse name: Not on file  . Number of children: Not on file  . Years of education: Not on file  . Highest education level: Not on file  Social Needs  . Financial resource strain: Not on file  . Food insecurity - worry: Not on file  . Food insecurity - inability: Not on file  . Transportation needs - medical: Not on file  . Transportation needs - non-medical: Not on file  Occupational History  . Not on file  Tobacco Use  . Smoking status: Never Smoker  . Smokeless tobacco: Never Used  Substance and Sexual  Activity  . Alcohol use: Yes    Comment: pt states that she has not used since finding out about the pregnancy  . Drug use: Yes    Types: Marijuana    Comment: pt states that she has not used since finding out about the pregnancy  . Sexual activity: Not Currently    Birth control/protection: None  Other Topics Concern  . Not on file  Social History Narrative  . Not on file   No Known Allergies  No medications prior to admission.    I have reviewed patient's Past Medical Hx, Surgical Hx, Family Hx, Social Hx, medications and allergies.   Review of Systems: Negative except for what is mentioned in HPI.  Physical Exam   Blood pressure 129/79, pulse (!) 105, temperature 99.1 F (37.3 C), unknown if currently breastfeeding.  Constitutional: Well-developed young female in no acute distress.  HENT: [place]/AT, normal oropharynx mucosa. MMM Eyes: normal conjunctivae, no scleral icterus Cardiovascular: normal rate, regular rhythm Respiratory: normal effort GI: Abd soft, non-distended, normoactive bowel sounds; mild appropriate tenderness near incision site. Fundus is nontender.  GU: Neg CVAT. MSK: Extremities nontender, no edema Neurologic: Alert and oriented x 4. Psych: Normal mood and affect Skin: warm and dry; Honeycomb dressing  in place at LTCS incision w/o drainage or erythema.    MAU Course/MDM:   Nursing notes and VS reviewed. VS wnl here.  She has pre-eclampsia with SF prior to delivery, BP here wnl  Percocet given here. Patient will have her mother fill her prescribed home meds.  Counseled on expected post-op pain and normal vaginal bleeding after a delivery. Advised to take ibuprofen as prescribed and Percocet prn. Also advised to take stool softners to ensure regular BMs.   Assessment and Plan  Assessment: 1. Post-operative pain   2. Status post primary low transverse cesarean section     Plan: --Pt counseled as above. --Discharge home in stable condition.   --Discussed return precautions, including fever, uncontrolled pain with po meds, or bleeding that soaks through a pad an hour.   Degele, Kandra NicolasJulie P, MD 06/11/2017 6:02 PM

## 2017-06-11 NOTE — Discharge Instructions (Signed)
°Cesarean Delivery, Care After °Refer to this sheet in the next few weeks. These instructions provide you with information on caring for yourself after your procedure. Your health care provider may also give you specific instructions. Your treatment has been planned according to current medical practices, but problems sometimes occur. Call your health care provider if you have any problems or questions after you go home. °HOME CARE INSTRUCTIONS  °· Only take over-the-counter or prescription medications as directed by your health care provider. °· Do not drink alcohol, especially if you are breastfeeding or taking medication to relieve pain. °· Do not  smoke tobacco. °· Continue to use good perineal care. Good perineal care includes: °¨ Wiping your perineum from front to back. °¨ Keeping your perineum clean. °· Check your surgical cut (incision) daily for increased redness, drainage, swelling, or separation of skin. °· Shower and clean your incision gently with soap and water every day, by letting warm and soapy water run over the incision, and then pat it dry. If your health care provider says it is okay, leave the incision uncovered. Use a bandage (dressing) if the incision is draining fluid or appears irritated. If the adhesive strips across the incision do not fall off within 7 days, carefully peel them off, after a shower. °· Hug a pillow when coughing or sneezing until your incision is healed. This helps to relieve pain. °· Do not use tampons, douches or have sexual intercourse, until your health care provider says it is okay. °· Wear a well-fitting bra that provides breast support. °· Limit wearing support panties or control-top hose. °· Drink enough fluids to keep your urine clear or pale yellow. °· Eat high-fiber foods such as whole grain cereals and breads, brown rice, beans, and fresh fruits and vegetables every day. These foods may help prevent or relieve constipation. °· Resume activities such as  climbing stairs, driving, lifting, exercising, or traveling as directed by your health care provider. °· Try to have someone help you with your household activities and your newborn for at least a few days after you leave the hospital. °· Rest as much as possible. Try to rest or take a nap when your newborn is sleeping. °· Increase your activities gradually. °· Do not lift more than 15lbs until directed by a provider. °· Keep all of your scheduled postpartum appointments. It is very important to keep your scheduled follow-up appointments. At these appointments, your health care provider will be checking to make sure that you are healing physically and emotionally. °SEEK MEDICAL CARE IF:  °· You are passing large clots from your vagina. Save any clots to show your health care provider. °· You have a foul smelling discharge from your vagina. °· You have trouble urinating. °· You are urinating frequently. °· You have pain when you urinate. °· You have a change in your bowel movements. °· You have increasing redness, pain, or swelling near your incision. °· You have pus draining from your incision. °· Your incision is separating. °· You have painful, hard, or reddened breasts. °· You have a severe headache. °· You have blurred vision or see spots. °· You feel sad or depressed. °· You have thoughts of hurting yourself or your newborn. °· You have questions about your care, the care of your newborn, or medications. °· You are dizzy or light-headed. °· You have a rash. °· You have pain, redness, or swelling at the site of the removed intravenous access (IV) tube. °· You have nausea   or vomiting. °· You stopped breastfeeding and have not had a menstrual period within 12 weeks of stopping. °· You are not breastfeeding and have not had a menstrual period within 12 weeks of delivery. °· You have a fever. °SEEK IMMEDIATE MEDICAL CARE IF: °· You have persistent pain. °· You have chest pain. °· You have shortness of breath. °· You  faint. °· You have leg pain. °· You have stomach pain. °· Your vaginal bleeding saturates 2 or more sanitary pads in 1 hour. °MAKE SURE YOU:  °· Understand these instructions. °· Will watch your condition. °· Will get help right away if you are not doing well or get worse. °Document Released: 01/15/2002 Document Revised: 09/09/2013 Document Reviewed: 12/21/2011 °ExitCare® Patient Information ©2015 [employer]. This information is not intended to replace advice given to you by your health care provider. Make sure you discuss any questions you have with your health care provider. ° ° °

## 2017-06-12 ENCOUNTER — Other Ambulatory Visit: Payer: Medicaid Other

## 2017-06-14 NOTE — H&P (Signed)
HPI: Alexis Blanchard is a 22 y.o. female with a history of gestational HTN, G2P0010 at 51w1dwho presents to maternity admissions for a Pre-E workup. She reports being at [facility] for NST where her BP was elevated. BP today in office was 159/109. She reports being in the office on 1/29 for a blood pressure check and her BP "was elevated really high". She denies HA, epigastric pain, or vision changes. She reports having some "tightness" more than 10 minutes apart, not painful. She reports good fetal movement, denies LOF, vaginal bleeding, vaginal itching/burning, urinary symptoms, h/a, dizziness, n/v, or fever/chills.  Past Medical History:      Past Medical History:  Diagnosis Date  . Headache   . Medical history non-contributory    Past obstetric history:                  OB History  Gravida Para Term Preterm AB Living  2 0 0 0 1 0  SAB TAB Ectopic Multiple Live Births  1 0 0 0 0    # Outcome Date GA Lbr Len/2nd Weight Sex Delivery Anes PTL Lv  2 Current           1 SAB              Past Surgical History:       Past Surgical History:  Procedure Laterality Date  . TONSILLECTOMY     Family History:       Family History  Problem Relation Age of Onset  . Hypertension Mother   . Heart disease Maternal Aunt    CHF  . Diabetes Maternal Grandmother   . Hypertension Maternal Grandmother   . Heart disease Maternal Grandmother   . Cancer Maternal Aunt 45   colon  . Cancer Maternal Aunt 45   throat  . Congestive Heart Failure Maternal Uncle    Social History:  Social History        Tobacco Use  . Smoking status: Never Smoker  . Smokeless tobacco: Never Used  Substance Use Topics  . Alcohol use: Yes    Comment: pt states that she has not used since finding out about the pregnancy  . Drug use: Yes    Types: Marijuana    Comment: pt states that she has not used since finding out about the pregnancy   Allergies: No Known Allergies  Meds:         Medications Prior  to Admission  Medication Sig Dispense Refill Last Dose  . ACCU-CHEK FASTCLIX LANCETS MISC Check blood sugars 4 times a day. 024.410 100 each 12 Taking  . aspirin EC 81 MG tablet Take 1 tablet (81 mg total) by mouth daily. 60 tablet 3 Taking  . glucose blood test strip Check sugar 4 times a day 250.00 100 each 12 Taking  . Prenat w/o A Vit-FeFum-FePo-FA (CONCEPT OB) 130-92.4-1 MG CAPS Take 1 tablet by mouth daily. 30 capsule 12 Taking   ROS:  Review of Systems  Constitutional: Negative.  Respiratory: Negative.  Cardiovascular: Negative.  Gastrointestinal: Negative.  Genitourinary: Negative.  Musculoskeletal: Negative.  Neurological: Negative.  Psychiatric/Behavioral: Negative.  Positive for Hypertension  I have reviewed patient's Past Medical Hx, Surgical Hx, Family Hx, Social Hx, medications and allergies.  Physical Exam  Patient Vitals for the past 24 hrs:   BP Temp Temp src Pulse Resp SpO2 Height Weight  06/08/17 1853 - - - - - 100 % - -  06/08/17 1845 (!) 137/92 - -  88 - - - -  06/08/17 1833 - - - - - 100 % - -  06/08/17 1830 133/86 - - 99 20 - - -  06/08/17 1818 - - - - - 99 % - -  06/08/17 1815 112/67 - - 89 - - - -  06/08/17 1812 127/80 - - 89 18 - - -  06/08/17 1805 - - - - - 99 % - -  06/08/17 1803 133/87 - - (!) 110 20 - - -  06/08/17 1748 130/85 - - (!) 101 18 100 % - -  06/08/17 1730 (!) 147/113 - - 94 - - - -  06/08/17 1715 (!) 146/105 - - 86 - 100 % - -  06/08/17 1700 (!) 154/103 - - 93 - 100 % - -  06/08/17 1645 (!) 162/116 - - 84 - 100 % - -  06/08/17 1630 (!) 159/111 - - (!) 104 - 99 % - -  06/08/17 1620 (!) 148/107 - - 97 - 99 % - -  06/08/17 1608 - - - - - 97 % - -  06/08/17 1600 (!) 143/107 - - 95 - - - -  06/08/17 1547 (!) 137/104 - - 93 - - - -  06/08/17 1525 (!) 146/95 98 F (36.7 C) Oral (!) 111 18 96 % 5\' 1"  (1.549 m) 122 lb (55.3 kg)   Constitutional: Well-developed, well-nourished female in no acute distress.  Cardiovascular: normal rate   Respiratory: normal effort  GI: Abd soft, non-tender, gravid appropriate for gestational age.  MS: Extremities nontender, no edema, normal ROM  Neurologic: Alert and oriented x 4.  GU: Neg CVAT.  CERVICAL EXAM: breech by US on 06/06/17  Dilation: Closed  Effacement (%): Thick  Cervical Position: Posterior  Station: -3  Exam by:: Lanice ShirtsV. Rogers, CNM  FHT: Baseline 145 , moderate variability, accelerations present, no decelerations  Contractions: 2-68minutes/ mild/ 60-80  Labs:  Lab Results Last 24 Hours  B/Positive/-- (09/10 1511)  Imaging:  [place] Mfm Fetal Bpp Wo Non Stress  Result Date: 06/06/2017  ---------------------------------------------------------------------- OBSTETRICS REPORT (Signed Final 06/06/2017 04:37 pm) ---------------------------------------------------------------------- Patient Info ID #: [id number] D.O.B.: 1996-03-11 (22 yrs) Name: Alexis Blanchard Visit Date: 06/06/2017 03:50 pm ---------------------------------------------------------------------- Performed By Performed By: Lenise Arena Ref. Address: [address] [place], [place] [address] Attending: Charlsie Merles MD Location: [facility] Referred By: Reva Bores MD ---------------------------------------------------------------------- Orders # Description Code 1 [place] MFM OB FOLLOW UP [phone] [id number] [place] MFM FETAL BPP WO NON STRESS 76819.01 ---------------------------------------------------------------------- # Ordered By Order # Accession # Episode # 1 Jaynie Collins [id number] 2 Jaynie Collins [id number]  ---------------------------------------------------------------------- Indications [redacted] weeks gestation of pregnancy Z3A.33 Hypertension - Gestational-asa O13.9 Gestational diabetes in pregnancy, diet O24.410 controlled ---------------------------------------------------------------------- OB History Blood Type: Height: 5'2" Weight (lb): 107 BMI: 19.57 Gravidity: 2 Term: 0 Prem: 0 SAB: 1 TOP: 0 Ectopic: 0 Living: 0 ---------------------------------------------------------------------- Fetal Evaluation Num Of Fetuses: 1 Fetal Heart 141 Rate(bpm): Cardiac Activity: Observed Presentation: Breech Amniotic Fluid AFI FV: Subjectively within normal limits AFI Sum(cm) %Tile Largest Pocket(cm) 20.48 77 7.09 RUQ(cm) RLQ(cm) LUQ(cm) LLQ(cm) 7.09 4.14 5.99 3.26 ---------------------------------------------------------------------- Biophysical Evaluation Amniotic F.V: Within normal limits F. Tone: Observed F. Movement: Observed Score: 8/8 F. Breathing: Observed ---------------------------------------------------------------------- Biometry BPD: 86.6 mm G. Age: 35w 0d 77 % CI: 76.75 % 70 - 86 FL/HC: 18.5 % 19.4 - 21.8 HC: 313.1 mm G. Age: 35w 1d 45 % HC/AC: 1.00 0.96 - 1.11 AC: 313.5 mm G. Age: 35w 2d 87 % FL/BPD: 66.9 % 71 - 87 FL: 57.9 mm G. Age: 30w 2d < 3 % FL/AC: 18.5 % 20 - 24 HUM: 52.2 mm G. Age: 30w 3d < 5 % Est. FW: 2300 gm 5 lb 1 oz 60 % ---------------------------------------------------------------------- Gestational Age LMP: 35w 6d Date: 09/28/16 EDD: 07/05/17 U/S Today: 34w 0d EDD: 07/18/17 Best: 33w 6d Det. By: Previous Ultrasound EDD: 07/19/17 (01/16/17) ---------------------------------------------------------------------- Anatomy Thoracic: Appears normal Abdomen: Appears normal Heart: Appears normal Bladder: Appears normal (4CH, axis, and situs Stomach: Appears normal, left sided ---------------------------------------------------------------------- Cervix Uterus Adnexa Cervix Not visualized (advanced GA >29wks)  ---------------------------------------------------------------------- Impression Singleton intrauterine pregnancy at 33+6 weeks with GDM and gestational HTN here for growth and BPP Interval review of the anatomy shows no sonographic markers for aneuploidy or structural anomalies All relevant fetal anatomy has been visualized Amniotic fluid volume is normal Estimated fetal weight shows growth in the 60th percentile BPP 8/8 ---------------------------------------------------------------------- Recommendations Recheck growth in 4 weeks Continue weekly BPP ---------------------------------------------------------------------- Charlsie Merles, MD Electronically Signed Final Report 06/06/2017 04:37 pm ----------------------------------------------------------------------  MAU Course/MDM:     Orders Placed This Encounter  Procedures  . Urinalysis, Routine w reflex microscopic  . CBC  . Comprehensive metabolic panel  . Protein / creatinine ratio, urine  Pre-E labs normal  NST reviewed  Consult Dr Jolayne Panther and Dr Erin Fulling with presentation, exam findings, test results, severe BPs. Recommend delivery by C/S due to breech presentation and severe preeclampsia. BMZ given on 06/06/2017.  Treatments in MAU included 20mg  labetalol IV. Magnesium started in MAU after diagnoses of severe preeclampsia.  Assessment:  1. Preeclampsia with severe features due to severe range BP greater than 4 hours apart  Plan:  C/S due to breech position of fetus  Care transferred to Dr. Erin Fulling, orders placed by Dr Jolayne Panther   Steward Drone  Certified Nurse-Midwife  06/08/2017  5:51 PM

## 2017-06-15 ENCOUNTER — Ambulatory Visit: Payer: Medicaid Other

## 2017-06-15 ENCOUNTER — Other Ambulatory Visit: Payer: Medicaid Other

## 2017-06-15 VITALS — BP 121/83 | HR 71

## 2017-06-15 DIAGNOSIS — Z5189 Encounter for other specified aftercare: Secondary | ICD-10-CM

## 2017-06-15 NOTE — Progress Notes (Signed)
Patient presented to the office today for incision check for blood pressure check and wound check. Honey comb and steri strips were removed successful. No signs of pus or warmth to the incision site. Advised patient to keep site clean and dry. Patient blood pressure was within normal range today 121/83.

## 2017-06-19 NOTE — Progress Notes (Signed)
I have reviewed the chart and agree with nursing staff's documentation of this patient's encounter.  Alexis CollinsUgonna Jarryd Gratz, MD 06/19/2017 8:10 AM

## 2017-06-20 ENCOUNTER — Encounter: Payer: Medicaid Other | Admitting: Family Medicine

## 2017-06-22 ENCOUNTER — Other Ambulatory Visit: Payer: Medicaid Other

## 2017-07-03 NOTE — Progress Notes (Deleted)
Subjective:     Alexis Blanchard is a 22 y.o. female who presents for a postpartum visit. She is {1-10:13787} {time; units:18646} postpartum following a {delivery:12449}. I have fully reviewed the prenatal and intrapartum course. The delivery was at   gestational weeks. Outcome: {delivery outcome:32078}. Anesthesia: {anesthesia types:812}. Postpartum course has been uncomplicated. Baby's course has been uncomplicated. Baby is feeding by {breast/bottle:69}. Bleeding {vag bleed:12292}. Bowel function is {normal:32111}. Bladder function is {normal:32111}. Patient {is/is not:9024} sexually active. Contraception method is {contraceptive method:5051}. Postpartum depression screening: {neg default:13464::"negative"}.  {Common ambulatory SmartLinks:19316}  Review of Systems {ros; complete:30496}   Objective:    There were no vitals taken for this visit.  General:  {gen appearance:16600}   Breasts:  {breast exam:1202::"inspection negative, no nipple discharge or bleeding, no masses or nodularity palpable"}  Lungs: {lung exam:16931}  Heart:  {heart exam:5510}  Abdomen: {abdomen exam:16834}   Vulva:  {labia exam:12198}  Vagina: {vagina exam:12200}  Cervix:  {cervix exam:14595}  Corpus: {uterus exam:12215}  Adnexa:  {adnexa exam:12223}  Rectal Exam: {rectal/vaginal exam:12274}        Assessment:    *** postpartum exam. Pap smear {done:10129} at today's visit.   Plan:    1. Contraception: {method:5051} 2. *** 3. Follow up in: {1-10:13787} {time; units:19136} or as needed.

## 2017-07-04 ENCOUNTER — Ambulatory Visit: Payer: Medicaid Other | Admitting: Obstetrics & Gynecology

## 2018-05-19 IMAGING — US US MFM FETAL BPP W/O NON-STRESS
1 series · 12 of 25 positions shown · non-contrast
Comparison: none

[Series 1: us mfm fetal bpp w/o non-stress · 25 acquisitions, 12 frames shown]
[im 2/25]
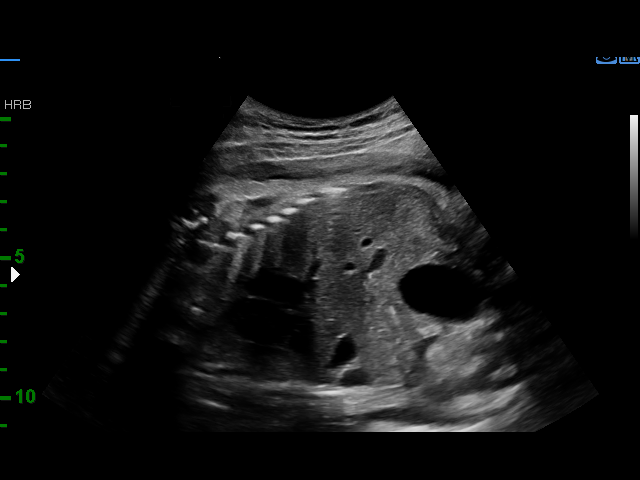
[im 4/25]
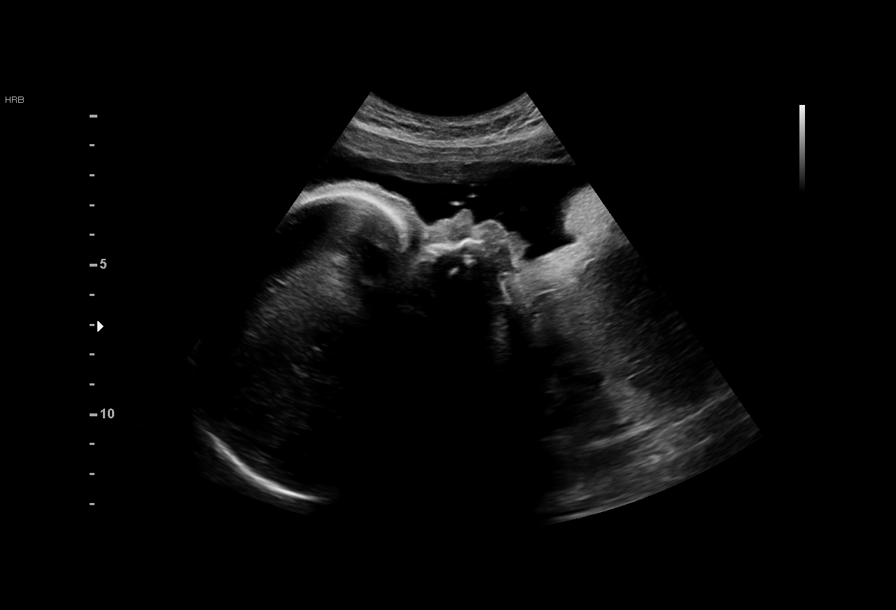
[im 6/25]
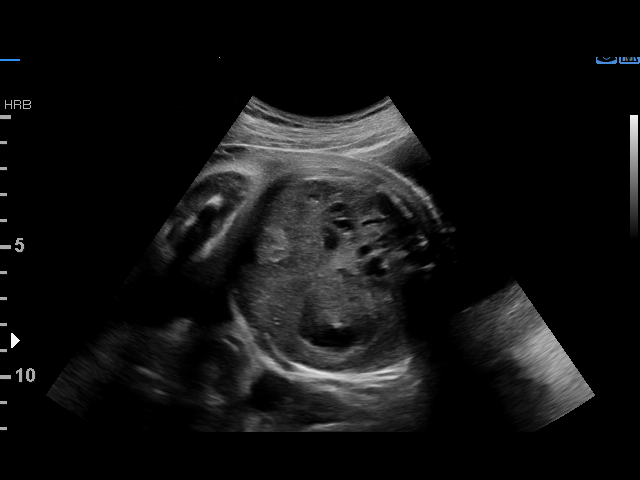
[im 8/25]
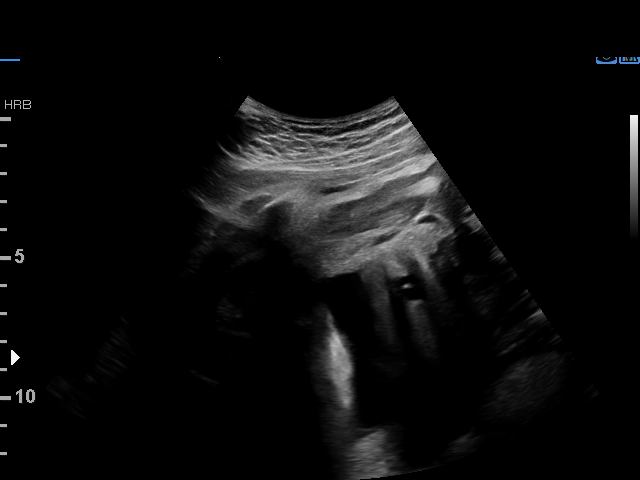
[im 10/25]
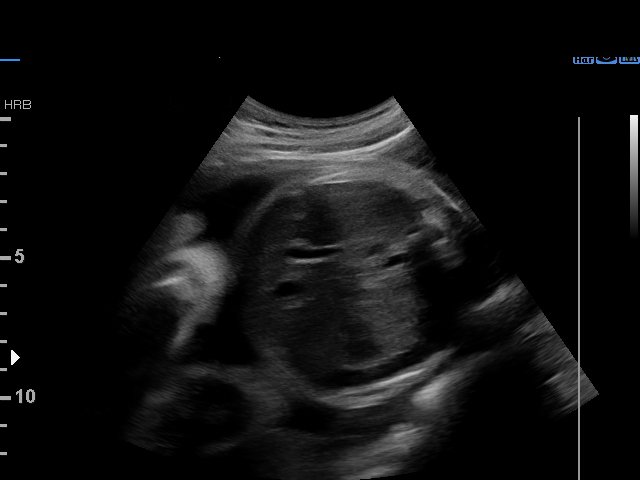
[im 12/25]
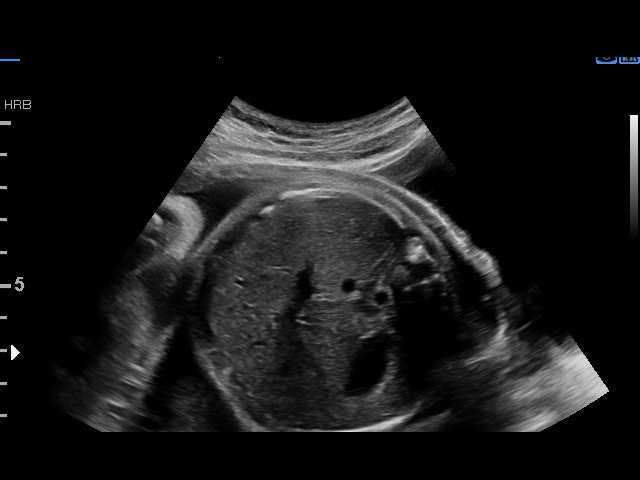
[im 14/25]
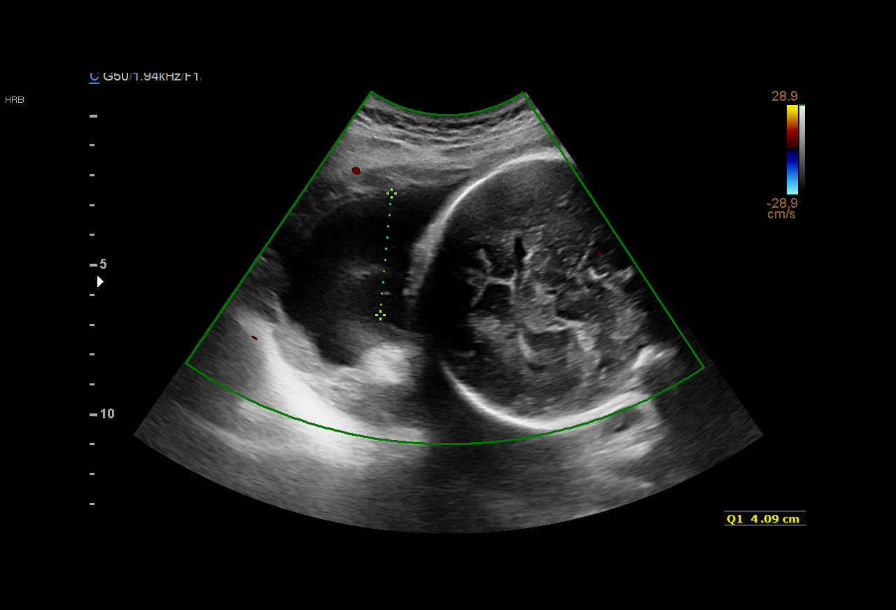
[im 16/25]
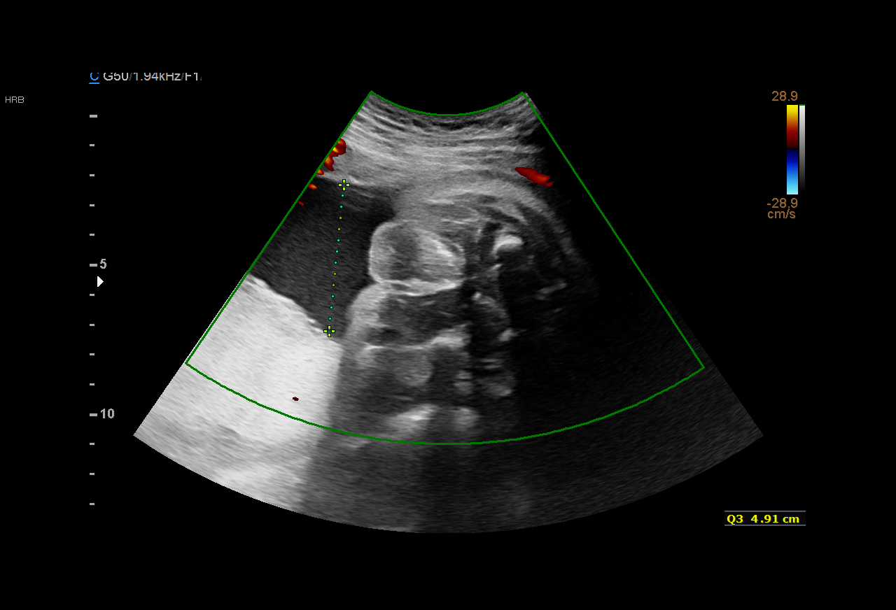
[im 18/25]
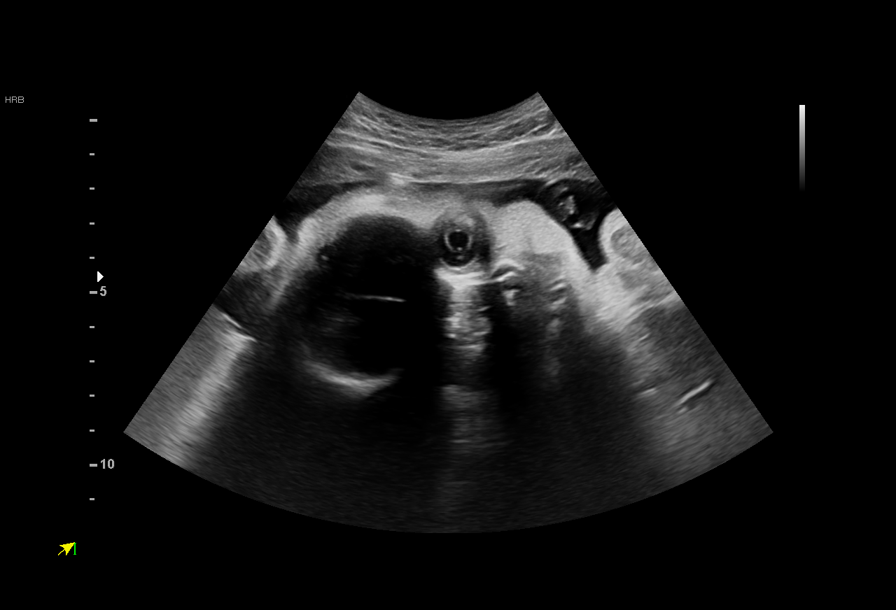
[im 20/25]
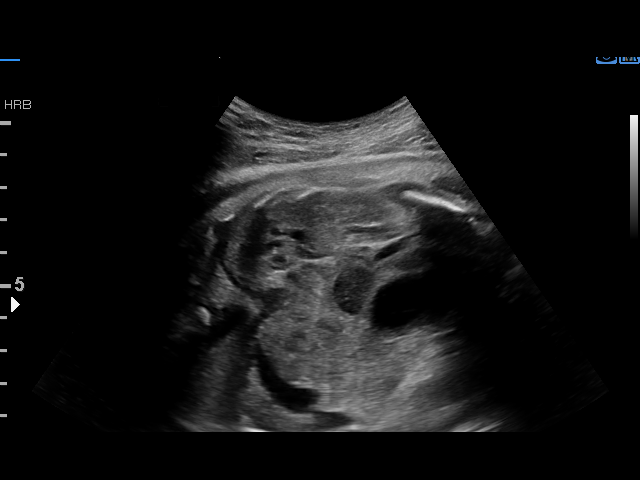
[im 22/25]
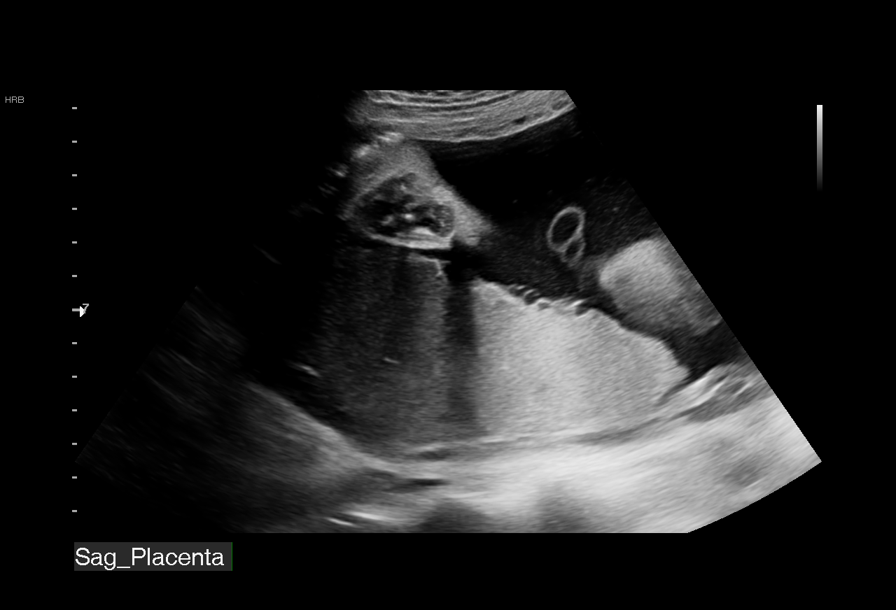
[im 24/25]
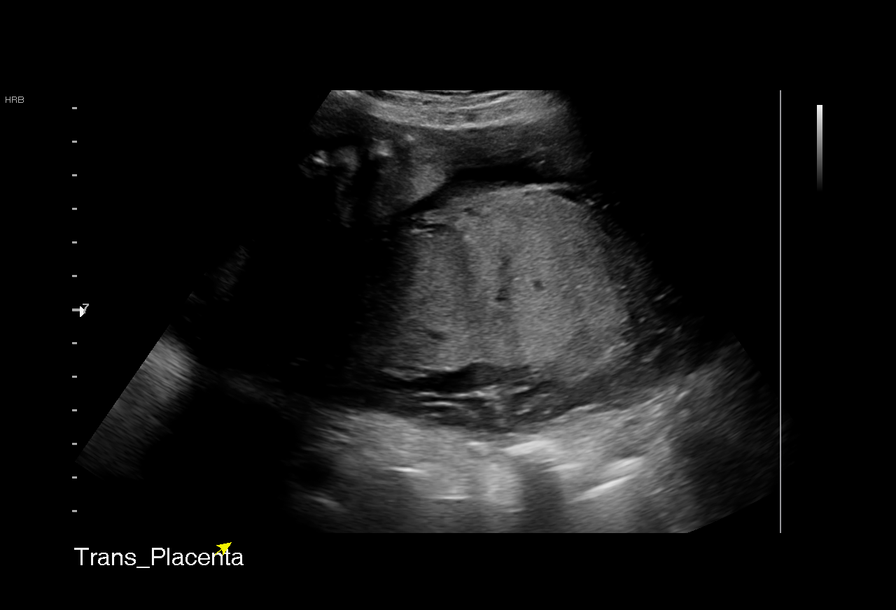

[12 of 25 positions shown; findings below may reference images not displayed]

pm)


1  CHAN KIT SHIRAH                [id number]      [id number]     [id number]
Indications

31 weeks gestation of pregnancy
Hypertension - Gestational
Gestational diabetes in pregnancy, diet
controlled
OB History

Blood Type:            Height:  5'2"   Weight (lb):  107       BMI:
Gravidity:    2         Term:   0        Prem:   0        SAB:   1
TOP:          0       Ectopic:  0        Living: 0
Fetal Evaluation

Num Of Fetuses:     1
Cardiac Activity:   Observed
Placenta:           Posterior, above cervical os

Amniotic Fluid
AFI FV:      Subjectively within normal limits

AFI Sum(cm)     %Tile       Largest Pocket(cm)
17.88           66

RUQ(cm)       RLQ(cm)       LUQ(cm)        LLQ(cm)
4.09
Biophysical Evaluation

Amniotic F.V:   Within normal limits       F. Tone:         Observed
F. Movement:    Observed                   Score:           [DATE]
F. Breathing:   Not Observed
Gestational Age

LMP:           33w 5d        Date:  09/28/16                 EDD:   07/05/17
Best:          31w 5d     Det. By:  Previous Ultrasound      EDD:   07/19/17
(01/16/17)
Anatomy

Thoracic:              Appears normal         Abdomen:                Appears normal
Stomach:               Appears normal, left   Bladder:                Appears normal
sided
Impression

IUP at 31+5 weeks with GDM and gestational HTN
Normal amniotic fluid
BPP [DATE] with no breathing noted and nonreactive NST
Recommendations

Recommend prolonged monitoring and repeat BPP within 24
hours

## 2018-05-26 IMAGING — US US MFM FETAL BPP W/O NON-STRESS
1 series · 12 of 22 positions shown · non-contrast
Comparison: none

[Series 1: us mfm fetal bpp w/o non-stress · 22 acquisitions, 12 frames shown]
[im 1/22]
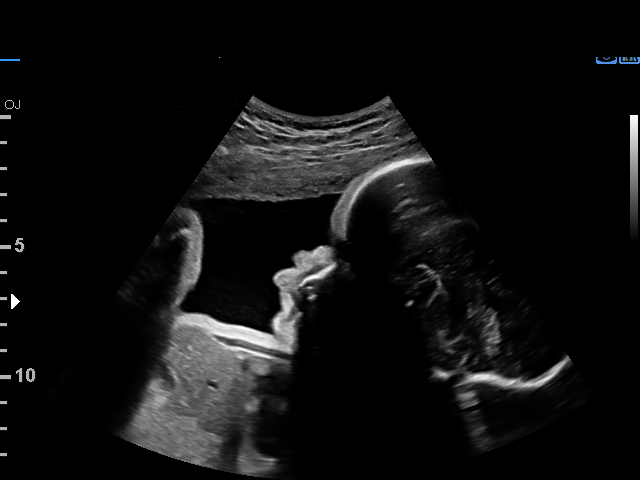
[im 3/22]
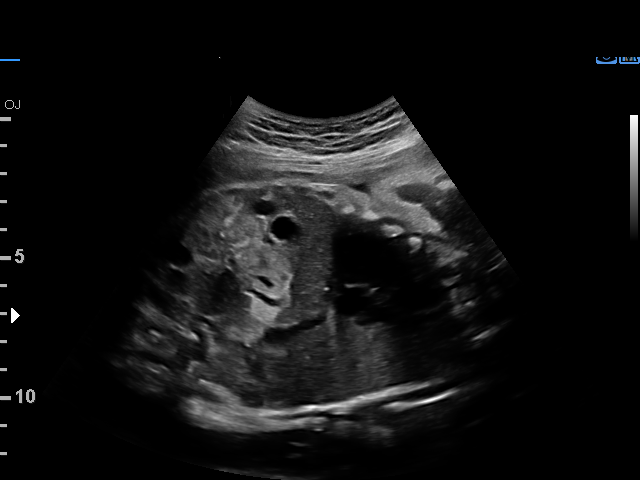
[im 5/22]
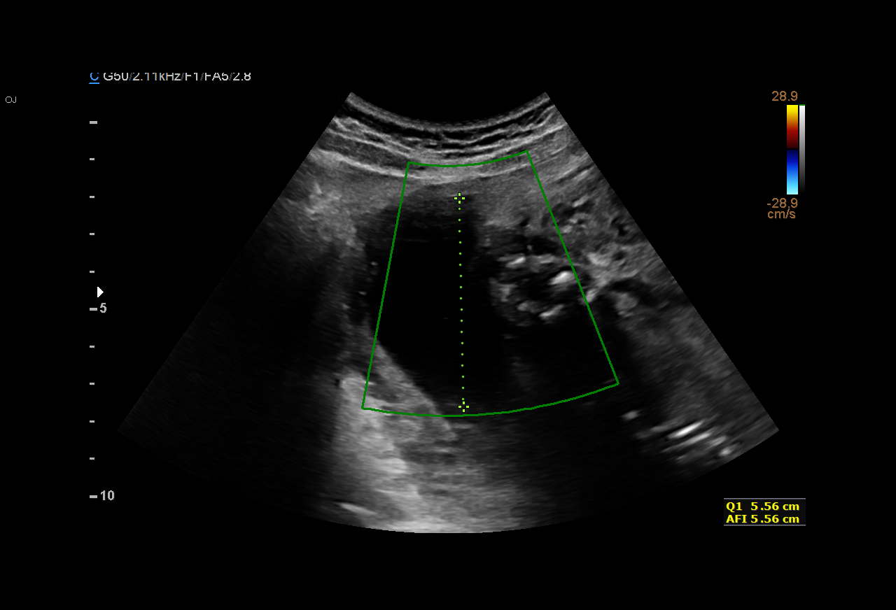
[im 7/22]
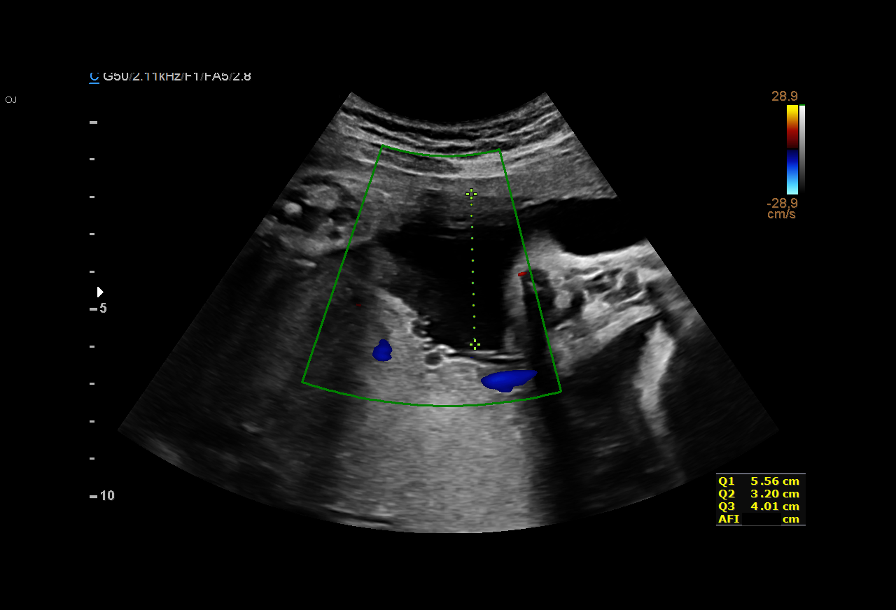
[im 9/22]
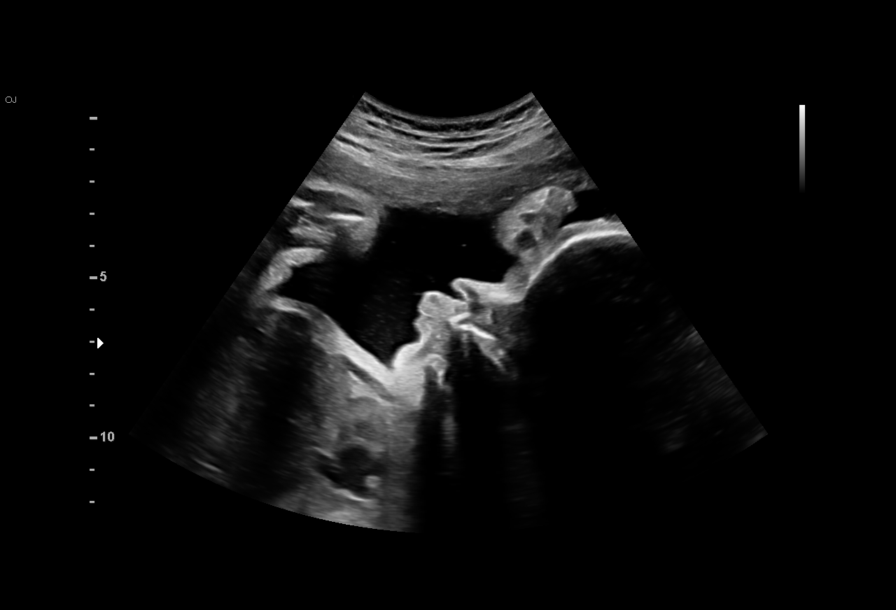
[im 11/22]
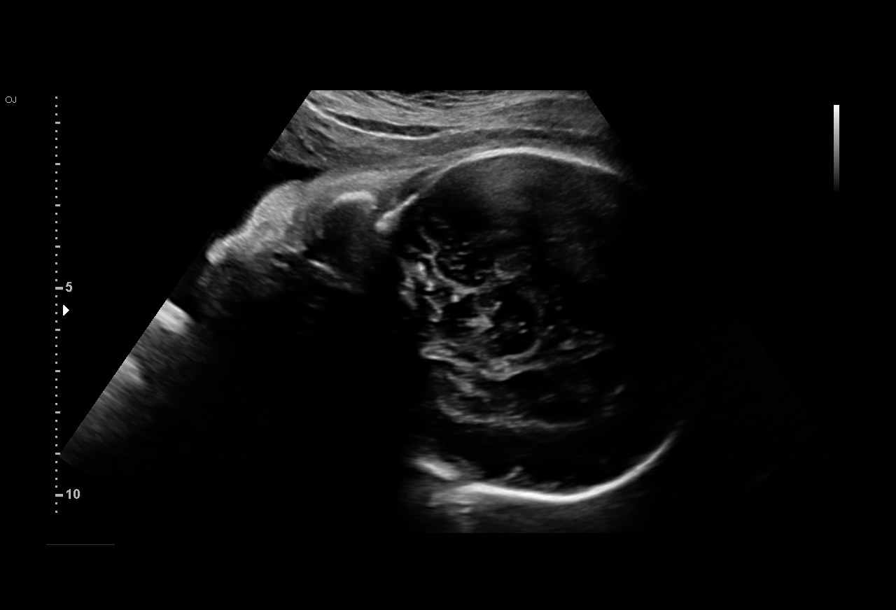
[im 12/22]
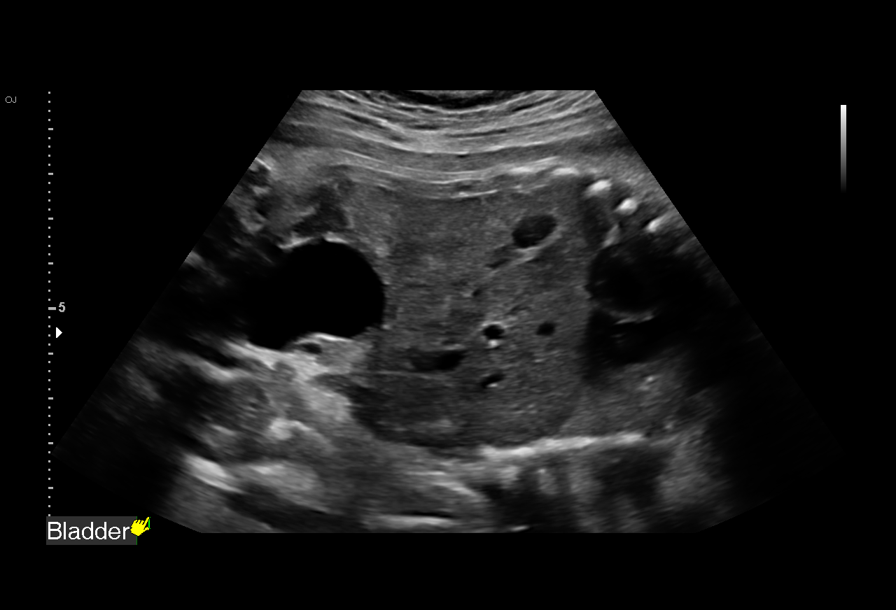
[im 14/22]
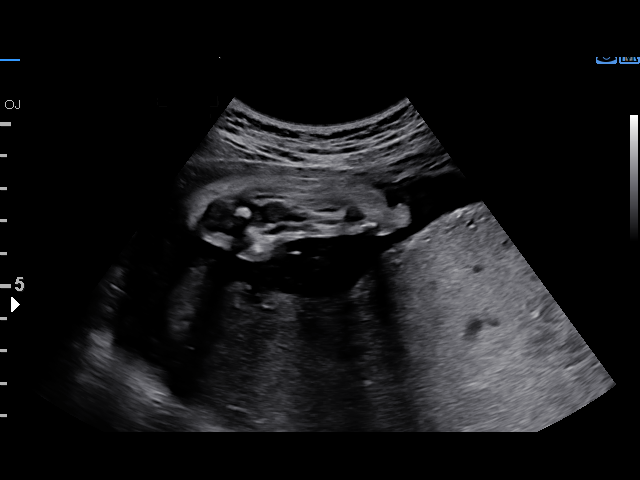
[im 16/22]
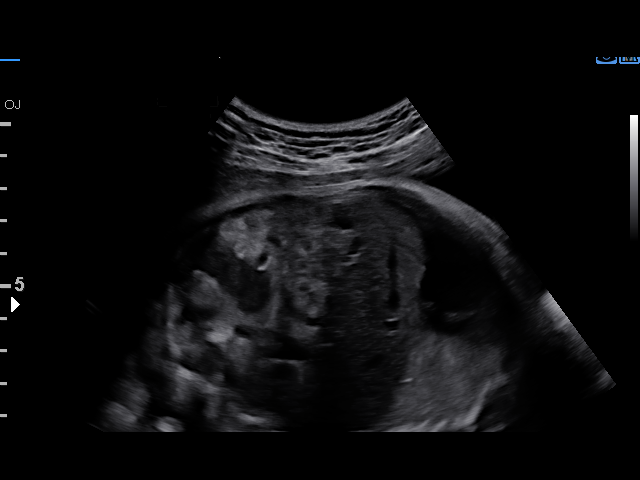
[im 18/22]
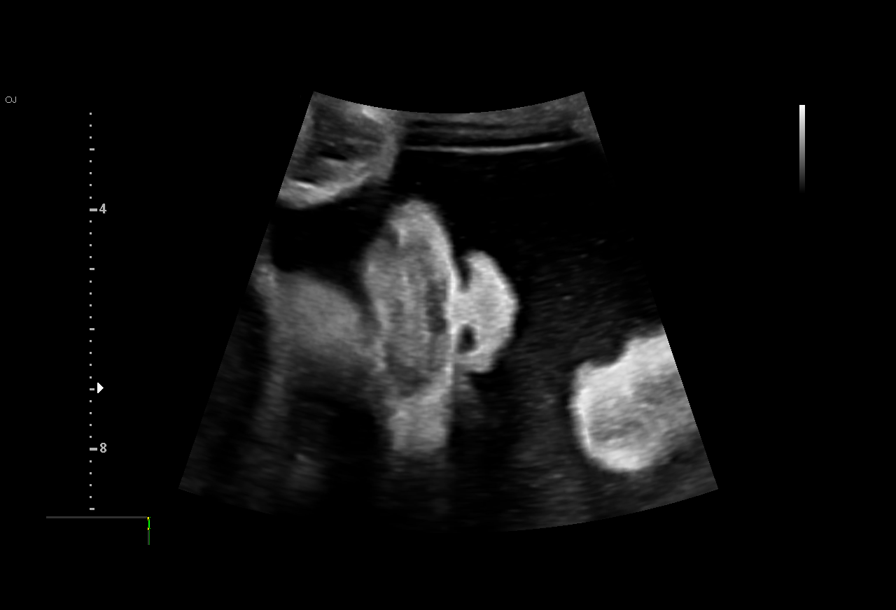
[im 20/22]
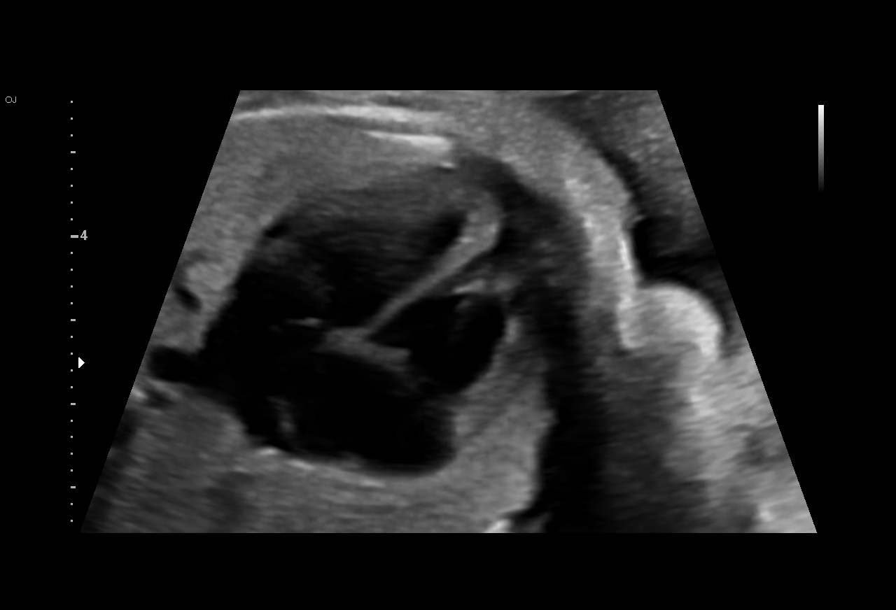
[im 22/22]
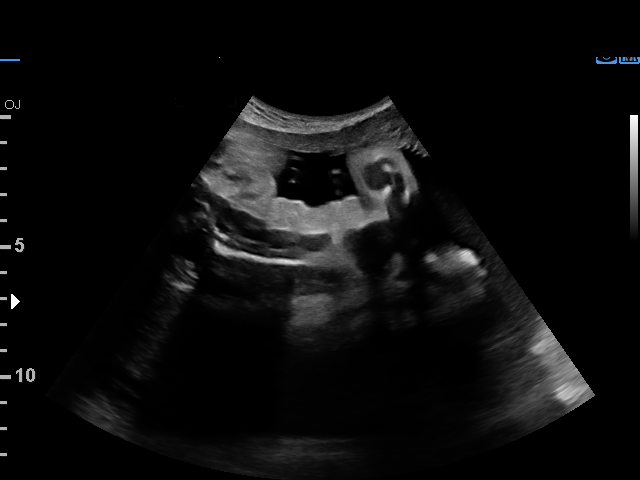

[12 of 22 positions shown; findings below may reference images not displayed]

1  DARIAN BOLLING           [id number]      [id number]     [id number]
Indications

32 weeks gestation of pregnancy
Hypertension - Gestational-asa
Gestational diabetes in pregnancy, diet
controlled
OB History

Blood Type:            Height:  5'2"   Weight (lb):  107       BMI:
Gravidity:    2         Term:   0        Prem:   0        SAB:   1
TOP:          0       Ectopic:  0        Living: 0
Fetal Evaluation

Num Of Fetuses:     1
Fetal Heart         140
Rate(bpm):
Cardiac Activity:   Observed
Presentation:       Cephalic

Amniotic Fluid
AFI FV:      Subjectively within normal limits

AFI Sum(cm)     %Tile       Largest Pocket(cm)
16.9            61

RUQ(cm)       RLQ(cm)       LUQ(cm)        LLQ(cm)
5.56
Biophysical Evaluation

Amniotic F.V:   Within normal limits       F. Tone:        Observed
F. Movement:    Observed                   Score:          [DATE]
F. Breathing:   Observed
Gestational Age

LMP:           34w 5d        Date:  09/28/16                 EDD:   07/05/17
Best:          32w 5d     Det. By:  Previous Ultrasound      EDD:   07/19/17
(01/16/17)
Impression

Intrauterine pregnancy at 32+5 weeks with GDM and
gestational HTN
Normal amniotic fluid
BPP [DATE]
Recommendations

Continue scheduled antenatal testing regimen

## 2019-04-01 ENCOUNTER — Telehealth: Payer: Self-pay | Admitting: Radiology

## 2019-04-01 NOTE — Telephone Encounter (Signed)
Patient called and left message that she needs New OB appointment, called to schedule, no answer, left voicemail to call [email or web address].

## 2019-04-23 ENCOUNTER — Inpatient Hospital Stay (HOSPITAL_COMMUNITY): Payer: Medicaid Other

## 2019-04-23 ENCOUNTER — Other Ambulatory Visit: Payer: Self-pay

## 2019-04-23 ENCOUNTER — Encounter (HOSPITAL_COMMUNITY): Payer: Self-pay | Admitting: Obstetrics & Gynecology

## 2019-04-23 ENCOUNTER — Inpatient Hospital Stay (HOSPITAL_COMMUNITY)
Admission: AD | Admit: 2019-04-23 | Discharge: 2019-04-23 | Disposition: A | Discharge status: Home / Self Care | Payer: Medicaid Other | Attending: Obstetrics & Gynecology | Admitting: Obstetrics & Gynecology

## 2019-04-23 DIAGNOSIS — O34219 Maternal care for unspecified type scar from previous cesarean delivery: Secondary | ICD-10-CM | POA: Diagnosis not present

## 2019-04-23 DIAGNOSIS — O26891 Other specified pregnancy related conditions, first trimester: Secondary | ICD-10-CM | POA: Diagnosis not present

## 2019-04-23 DIAGNOSIS — O200 Threatened abortion: Secondary | ICD-10-CM | POA: Insufficient documentation

## 2019-04-23 DIAGNOSIS — Z3A09 9 weeks gestation of pregnancy: Secondary | ICD-10-CM | POA: Diagnosis not present

## 2019-04-23 DIAGNOSIS — Z6790 Unspecified blood type, Rh positive: Secondary | ICD-10-CM | POA: Insufficient documentation

## 2019-04-23 DIAGNOSIS — O4690 Antepartum hemorrhage, unspecified, unspecified trimester: Secondary | ICD-10-CM

## 2019-04-23 DIAGNOSIS — O161 Unspecified maternal hypertension, first trimester: Secondary | ICD-10-CM | POA: Insufficient documentation

## 2019-04-23 HISTORY — DX: Essential (primary) hypertension: I10

## 2019-04-23 LAB — URINALYSIS, ROUTINE W REFLEX MICROSCOPIC
Bacteria, UA: NONE SEEN
Bilirubin Urine: NEGATIVE
Glucose, UA: NEGATIVE mg/dL
Ketones, ur: NEGATIVE mg/dL
Leukocytes,Ua: NEGATIVE
Nitrite: NEGATIVE
Protein, ur: NEGATIVE mg/dL
Specific Gravity, Urine: 1.01 (ref 1.005–1.030)
pH: 6 (ref 5.0–8.0)

## 2019-04-23 LAB — WET PREP, GENITAL
Sperm: NONE SEEN
Trich, Wet Prep: NONE SEEN
Yeast Wet Prep HPF POC: NONE SEEN

## 2019-04-23 LAB — CBC
HCT: 38.9 % (ref 36.0–46.0)
Hemoglobin: 13 g/dL (ref 12.0–15.0)
MCH: 26.5 pg (ref 26.0–34.0)
MCHC: 33.4 g/dL (ref 30.0–36.0)
MCV: 79.4 fL — ABNORMAL LOW (ref 80.0–100.0)
Platelets: 321 10*3/uL (ref 150–400)
RBC: 4.9 MIL/uL (ref 3.87–5.11)
RDW: 14.6 % (ref 11.5–15.5)
WBC: 4.6 10*3/uL (ref 4.0–10.5)
nRBC: 0 % (ref 0.0–0.2)

## 2019-04-23 LAB — POCT PREGNANCY, URINE: Preg Test, Ur: POSITIVE — AB

## 2019-04-23 LAB — HCG, QUANTITATIVE, PREGNANCY: hCG, Beta Chain, Quant, S: 15552 m[IU]/mL — ABNORMAL HIGH (ref <5)

## 2019-04-23 NOTE — MAU Provider Note (Addendum)
Chief Complaint: Vaginal Bleeding   First Provider Initiated Contact with Patient 04/23/19 0950      SUBJECTIVE HPI: Alexis Blanchard is a 23 y.o. [id number] at 5770w6d by LMP who presents to maternity admissions reporting vaginal bleeding. Patient first noticed bleeding 2 days ago with light brown spotting that was sporadic. She states she passed no clots and required no pad use. This morning patient noticed discharge was more red and became concerned due to previous history of miscarriage.   Patient also reports lower abdominal pain. Pain is dull/crampy and is associated with the bleeding over the past 2 days. Pain does not radiate and is not associated with urination, defectation, or n/v. Patient states she is not concerned about UTI or STDs despite sexual activity 3 weeks without protection. She denies frequent or burning urination, vaginal itching, fevers/chills, discharge other other than the blood as noted above.    Past Medical History:  Diagnosis Date  . Headache   . Hypertension    preE with previous pregnancy  . Medical history non-contributory    Past Surgical History:  Procedure Laterality Date  . CESAREAN SECTION N/A 06/08/2017   Procedure: CESAREAN SECTION;  Surgeon: Willodean RosenthalHarraway-Smith, Carolyn, MD;  [facility];  Service: Obstetrics;  Laterality: N/A;  . TONSILLECTOMY     Social History   Socioeconomic History  . Marital status: Single    Spouse name: Not on file  . Number of children: Not on file  . Years of education: Not on file  . Highest education level: Not on file  Occupational History  . Not on file  Tobacco Use  . Smoking status: Never Smoker  . Smokeless tobacco: Never Used  Substance and Sexual Activity  . Alcohol use: Not Currently    Comment: pt states that she has not used since finding out about the pregnancy  . Drug use: Yes    Types: Marijuana    Comment: pt states that she has not used since finding out about the pregnancy  . Sexual  activity: Yes    Birth control/protection: None  Other Topics Concern  . Not on file  Social History Narrative  . Not on file   Social Determinants of Health   Financial Resource Strain:   . Difficulty of Paying Living Expenses: Not on file  Food Insecurity:   . Worried About Programme researcher, broadcasting/film/videounning Out of Food in the Last Year: Not on file  . Ran Out of Food in the Last Year: Not on file  Transportation Needs:   . Lack of Transportation (Medical): Not on file  . Lack of Transportation (Non-Medical): Not on file  Physical Activity:   . Days of Exercise per Week: Not on file  . Minutes of Exercise per Session: Not on file  Stress:   . Feeling of Stress : Not on file  Social Connections:   . Frequency of Communication with Friends and Family: Not on file  . Frequency of Social Gatherings with Friends and Family: Not on file  . Attends Religious Services: Not on file  . Active Member of Clubs or Organizations: Not on file  . Attends BankerClub or Organization Meetings: Not on file  . Marital Status: Not on file  Intimate Partner Violence:   . Fear of Current or Ex-Partner: Not on file  . Emotionally Abused: Not on file  . Physically Abused: Not on file  . Sexually Abused: Not on file   No current facility-administered medications on file prior to encounter.  Current Outpatient Medications on File Prior to Encounter  Medication Sig Dispense Refill  . Prenat w/o A Vit-FeFum-FePo-FA (CONCEPT OB) 130-92.4-1 MG CAPS Take 1 tablet by mouth daily. 30 capsule 12   No Known Allergies  ROS:  Negative as per HPI +VB +abd pain No constipation or diarrhea No urinary sx  I have reviewed patient's Past Medical Hx, Surgical Hx, Family Hx, Social Hx, medications and allergies.   Physical Exam   Patient Vitals for the past 24 hrs:  BP Temp Temp src Pulse Resp SpO2 Weight  04/23/19 1228 130/76 -- -- 83 16 -- --  04/23/19 0934 122/74 98.4 F (36.9 C) Oral 85 16 100 % --  04/23/19 0925 -- -- -- -- --  -- 49.1 kg   Constitutional: Well-developed, well-nourished female in no acute distress.  Cardiovascular: normal rate Respiratory: normal effort GI: pain not reproducible upon palpation, Abd soft, non-tender MS: Extremities nontender, no edema, normal ROM Neurologic: Alert and oriented x 4.  GU: Neg CVAT.  PELVIC EXAM: Cervix pink, visually closed, without lesion, scant blood in vault, vaginal walls and external genitalia normal Bimanual exam: Cervix 0/long/high, firm, anterior, neg CMT, uterus nontender, nonenlarged, adnexa without tenderness, enlargement, or mass  LAB RESULTS Results for orders placed or performed during the hospital encounter of 04/23/19 (from the past 24 hour(s))  Pregnancy, urine POC     Status: Abnormal   Collection Time: 04/23/19  9:29 AM  Result Value Ref Range   Preg Test, Ur POSITIVE (A) NEGATIVE  Urinalysis, Routine w reflex microscopic     Status: Abnormal   Collection Time: 04/23/19  9:51 AM  Result Value Ref Range   Color, Urine YELLOW YELLOW   APPearance CLEAR CLEAR   Specific Gravity, Urine 1.010 1.005 - 1.030   pH 6.0 5.0 - 8.0   Glucose, UA NEGATIVE NEGATIVE mg/dL   Hgb urine dipstick SMALL (A) NEGATIVE   Bilirubin Urine NEGATIVE NEGATIVE   Ketones, ur NEGATIVE NEGATIVE mg/dL   Protein, ur NEGATIVE NEGATIVE mg/dL   Nitrite NEGATIVE NEGATIVE   Leukocytes,Ua NEGATIVE NEGATIVE   RBC / HPF 0-5 0 - 5 RBC/hpf   WBC, UA 0-5 0 - 5 WBC/hpf   Bacteria, UA NONE SEEN NONE SEEN   Squamous Epithelial / LPF 0-5 0 - 5   Mucus PRESENT   CBC     Status: Abnormal   Collection Time: 04/23/19 10:23 AM  Result Value Ref Range   WBC 4.6 4.0 - 10.5 K/uL   RBC 4.90 3.87 - 5.11 MIL/uL   Hemoglobin 13.0 12.0 - 15.0 g/dL   HCT 38.9 36.0 - 46.0 %   MCV 79.4 (L) 80.0 - 100.0 fL   MCH 26.5 26.0 - 34.0 pg   MCHC 33.4 30.0 - 36.0 g/dL   RDW 14.6 11.5 - 15.5 %   Platelets 321 150 - 400 K/uL   nRBC 0.0 0.0 - 0.2 %  hCG, quantitative, pregnancy     Status: Abnormal    Collection Time: 04/23/19 10:23 AM  Result Value Ref Range   hCG, Beta Chain, Quant, S 15,552 (H) <5 mIU/mL  Wet prep, genital     Status: Abnormal   Collection Time: 04/23/19 10:33 AM   Specimen: Vaginal  Result Value Ref Range   Yeast Wet Prep HPF POC NONE SEEN NONE SEEN   Trich, Wet Prep NONE SEEN NONE SEEN   Clue Cells Wet Prep HPF POC PRESENT (A) NONE SEEN   WBC, Wet Prep HPF POC MANY (  A) NONE SEEN   Sperm NONE SEEN    IMAGING US OB LESS THAN 14 WEEKS WITH OB TRANSVAGINAL  Result Date: 04/23/2019 CLINICAL DATA:  Vaginal bleeding EXAM: OBSTETRIC <14 WK [place] AND TRANSVAGINAL OB US TECHNIQUE: Both transabdominal and transvaginal ultrasound examinations were performed for complete evaluation of the gestation as well as the maternal uterus, adnexal regions, and pelvic cul-de-sac. Transvaginal technique was performed to assess early pregnancy. COMPARISON:  None. FINDINGS: Intrauterine gestational sac: Visualized Yolk sac:  Visualized Embryo:  Visualized Cardiac Activity: Not visualized CRL:  3 mm   5 w   5 d                  [place] EDC: December 19, 2019 Subchorionic hemorrhage:  None visualized. Maternal uterus/adnexae: Cervical os is closed. Right ovary measures 2.5 x 1.2 x 2.1 cm. Left ovary measures 3.2 x 12.0 x 3.2 cm. No extrauterine pelvic mass or free pelvic fluid. IMPRESSION: There is a fetal pole evident without fetal heart activity appreciable. Findings are suspicious but not yet definitive for failed pregnancy. Recommend follow-up US in 10-14 days for definitive diagnosis. This recommendation follows SRU consensus guidelines: Diagnostic Criteria for Nonviable Pregnancy Early in the First Trimester. Malva Limes Med 2013; 779:3903-00. crown-rump length measures 3 mm which would indicate 6-weeks estimated gestation. Study otherwise unremarkable. Electronically Signed   By: Bretta Bang III M.D.   On: 04/23/2019 11:55   MAU Management/MDM: CBC: non-remarkable, Hb 13, platelets  321 Ultrasound: fetus present within uterus, absence of fetal cardiac activity, measures 3 mm indicating 6 weeks estimated gestation despite 9 weeks since LMP B-HCG: pending GC/Chlamydia probe: pending Wet prep: WBC present with clue cells    ASSESSMENT/PLAN Patient is a 23 y.o. [id number] at [redacted]w[redacted]d by LMP with past medical history of SAB and unprotected sex 3 weeks prior presenting to maternity admissions reporting 2 days of light vaginal bleeding and cramping.  1. Threatened abortion Patient presenting with hx of SAB and 2 days of vaginal bleeding/lower abdominal cramping with closed cervical os and scant blood in vaginal vault, ultrasound confirmed fetus present in uterus without cardiac activity, size/date discrepancy as patient reports 9 weeks since LMP however fetus measuring 3 mm consistent with [redacted] weeks gestation on crown-rump length, findings suspicious for failed pregnancy but not definitive diagnosis --previously scheduled appt with outpatient OB provider [facility]) on 12/23 will be used as follow-up for this visit, counseled patient if she develops worsening bleeding or abdominal pain she should return to MAU for evaluation --repeat ultrasound   2. Blood type, Rh postive  Hilario Quarry Medical Student  04/23/2019  2:44 PM   I confirm that I have verified the information documented in the medical student's note and that I have also personally reperformed the history, physical exam and all medical decision making activities of this service and have verified that all service and findings are accurately documented in this student's note.   US findings concerning for failed pregnancy. Will rpt [place] in 10 days for viability. Message sent to [facility]. SAB precautions.  Donette Larry, CNM 04/23/2019 2:39 PM

## 2019-04-23 NOTE — MAU Note (Signed)
VB started two days ago.  Started as light brown and today is red, but mostly when she wipes.  No other discharge.  No pain at this time. No recent SI.

## 2019-04-23 NOTE — Discharge Instructions (Signed)
Threatened Miscarriage  A threatened miscarriage occurs when a woman has vaginal bleeding during the first 20 weeks of pregnancy but the pregnancy has not ended. If you have vaginal bleeding during this time, your health care provider will do tests to make sure you are still pregnant. If the tests show that you are still pregnant and that the developing baby (fetus) inside your uterus is still growing, your condition is considered a threatened miscarriage. A threatened miscarriage does not mean your pregnancy will end, but it does increase the risk of losing your pregnancy (complete miscarriage). What are the causes? The cause of this condition is usually not known. For women who go on to have a complete miscarriage, the most common cause is an abnormal number of chromosomes in the developing baby. Chromosomes are the structures inside cells that hold all of a person's genetic material. What increases the risk? The following lifestyle factors may increase your risk of a miscarriage in early pregnancy:  Smoking.  Drinking excessive amounts of alcohol or caffeine.  Recreational drug use. The following preexisting health conditions may increase your risk of a miscarriage in early pregnancy:  Polycystic ovary syndrome.  Uterine fibroids.  Infections.  Diabetes mellitus. What are the signs or symptoms? Symptoms of this condition include:  Vaginal bleeding.  Mild abdominal pain or cramps. How is this diagnosed? If you have bleeding with or without abdominal pain before 20 weeks of pregnancy, your health care provider will do tests to check whether you are still pregnant. These will include:  Ultrasound. This test uses sound waves to create images of the inside of your uterus. This allows your health care provider to look at your developing baby and other structures, such as your placenta.  Pelvic exam. This is an internal exam of your vagina and cervix.  Measurement of your baby's heart  rate.  Laboratory tests such as blood tests, urine tests, or swabs for infection You may be diagnosed with a threatened miscarriage if:  Ultrasound testing shows that you are still pregnant.  Your baby's heart rate is strong.  A pelvic exam shows that the opening between your uterus and your vagina (cervix) is closed.  Blood tests confirm that you are still pregnant. How is this treated? No treatments have been shown to prevent a threatened miscarriage from going on to a complete miscarriage. However, the right home care is important. Follow these instructions at home:  Get plenty of rest.  Do not have sex or use tampons if you have vaginal bleeding.  Do not douche.  Do not smoke or use recreational drugs.  Do not drink alcohol.  Avoid caffeine.  Keep all follow-up prenatal visits as told by your health care provider. This is important. Contact a health care provider if:  You have light vaginal bleeding or spotting while pregnant.  You have abdominal pain or cramping.  You have a fever. Get help right away if:  You have heavy vaginal bleeding.  You have blood clots coming from your vagina.  You pass tissue from your vagina.  You leak fluid, or you have a gush of fluid from your vagina.  You have severe low back pain or abdominal cramps.  You have fever, chills, and severe abdominal pain. Summary  A threatened miscarriage occurs when a woman has vaginal bleeding during the first 20 weeks of pregnancy but the pregnancy has not ended.  The cause of a threatened miscarriage is usually not known.  Symptoms of this condition may   include vaginal bleeding and mild abdominal pain or cramps.  No treatments have been shown to prevent a threatened miscarriage from going on to a complete miscarriage.  Keep all follow-up prenatal visits as told by your health care provider. This is important. This information is not intended to replace advice given to you by your health  care provider. Make sure you discuss any questions you have with your health care provider. Document Released: 04/25/2005 Document Revised: 06/01/2017 Document Reviewed: 07/22/2016 Elsevier Patient Education  2020 [employer]  

## 2019-04-24 ENCOUNTER — Other Ambulatory Visit: Payer: Self-pay

## 2019-04-24 ENCOUNTER — Encounter (HOSPITAL_COMMUNITY): Payer: Self-pay | Admitting: Obstetrics & Gynecology

## 2019-04-24 ENCOUNTER — Inpatient Hospital Stay (HOSPITAL_COMMUNITY): Payer: Medicaid Other

## 2019-04-24 ENCOUNTER — Other Ambulatory Visit: Payer: Self-pay | Admitting: *Deleted

## 2019-04-24 ENCOUNTER — Inpatient Hospital Stay (HOSPITAL_COMMUNITY)
Admission: AD | Admit: 2019-04-24 | Discharge: 2019-04-24 | Disposition: A | Discharge status: Home / Self Care | Payer: Medicaid Other | Attending: Obstetrics & Gynecology | Admitting: Obstetrics & Gynecology

## 2019-04-24 DIAGNOSIS — N939 Abnormal uterine and vaginal bleeding, unspecified: Secondary | ICD-10-CM | POA: Diagnosis present

## 2019-04-24 DIAGNOSIS — Z6790 Unspecified blood type, Rh positive: Secondary | ICD-10-CM

## 2019-04-24 DIAGNOSIS — Z8249 Family history of ischemic heart disease and other diseases of the circulatory system: Secondary | ICD-10-CM | POA: Diagnosis not present

## 2019-04-24 DIAGNOSIS — O039 Complete or unspecified spontaneous abortion without complication: Secondary | ICD-10-CM

## 2019-04-24 DIAGNOSIS — O4690 Antepartum hemorrhage, unspecified, unspecified trimester: Secondary | ICD-10-CM

## 2019-04-24 HISTORY — DX: Gestational (pregnancy-induced) hypertension without significant proteinuria, unspecified trimester: O13.9

## 2019-04-24 LAB — CBC
HCT: 42.5 % (ref 36.0–46.0)
Hemoglobin: 13.9 g/dL (ref 12.0–15.0)
MCH: 26.2 pg (ref 26.0–34.0)
MCHC: 32.7 g/dL (ref 30.0–36.0)
MCV: 80.2 fL (ref 80.0–100.0)
Platelets: 346 10*3/uL (ref 150–400)
RBC: 5.3 MIL/uL — ABNORMAL HIGH (ref 3.87–5.11)
RDW: 14.8 % (ref 11.5–15.5)
WBC: 4.6 10*3/uL (ref 4.0–10.5)
nRBC: 0 % (ref 0.0–0.2)

## 2019-04-24 LAB — COMPREHENSIVE METABOLIC PANEL
ALT: 13 U/L (ref 0–44)
AST: 19 U/L (ref 15–41)
Albumin: 4.1 g/dL (ref 3.5–5.0)
Alkaline Phosphatase: 56 U/L (ref 38–126)
Anion gap: 7 (ref 5–15)
BUN: 8 mg/dL (ref 6–20)
CO2: 25 mmol/L (ref 22–32)
Calcium: 9.5 mg/dL (ref 8.9–10.3)
Chloride: 105 mmol/L (ref 98–111)
Creatinine, Ser: 0.66 mg/dL (ref 0.44–1.00)
GFR calc Af Amer: >60 mL/min (ref >60)
GFR calc non Af Amer: >60 mL/min (ref >60)
Glucose, Bld: 100 mg/dL — ABNORMAL HIGH (ref 70–99)
Potassium: 4 mmol/L (ref 3.5–5.1)
Sodium: 137 mmol/L (ref 135–145)
Total Bilirubin: 0.6 mg/dL (ref 0.3–1.2)
Total Protein: 6.9 g/dL (ref 6.5–8.1)

## 2019-04-24 LAB — URINALYSIS, MICROSCOPIC (REFLEX)

## 2019-04-24 LAB — URINALYSIS, ROUTINE W REFLEX MICROSCOPIC
Bilirubin Urine: NEGATIVE
Glucose, UA: NEGATIVE mg/dL
Ketones, ur: NEGATIVE mg/dL
Leukocytes,Ua: NEGATIVE
Nitrite: NEGATIVE
Protein, ur: NEGATIVE mg/dL
Specific Gravity, Urine: 1.025 (ref 1.005–1.030)
pH: 6 (ref 5.0–8.0)

## 2019-04-24 LAB — GC/CHLAMYDIA PROBE AMP (~~LOC~~) NOT AT ARMC
Chlamydia: NEGATIVE
Comment: NEGATIVE
Comment: NORMAL
Neisseria Gonorrhea: NEGATIVE

## 2019-04-24 LAB — HCG, QUANTITATIVE, PREGNANCY: hCG, Beta Chain, Quant, S: 13833 m[IU]/mL — ABNORMAL HIGH (ref <5)

## 2019-04-24 MED ORDER — CYCLOBENZAPRINE HCL 10 MG PO TABS
10.0000 mg | ORAL_TABLET | Freq: Once | ORAL | Status: AC
  Administered 2019-04-24: 09:00:00 10 mg via ORAL
  Filled 2019-04-24: qty 1

## 2019-04-24 MED ORDER — IBUPROFEN 600 MG PO TABS: 600.0000 mg | ORAL_TABLET | Freq: Four times a day (QID) | ORAL | 0 refills | Status: DC | PRN

## 2019-04-24 MED ORDER — IBUPROFEN 600 MG PO TABS
600.0000 mg | ORAL_TABLET | Freq: Once | ORAL | Status: AC
  Administered 2019-04-24: 11:00:00 600 mg via ORAL
  Filled 2019-04-24: qty 1

## 2019-04-24 MED ORDER — ACETAMINOPHEN 500 MG PO TABS
1000.0000 mg | ORAL_TABLET | Freq: Once | ORAL | Status: AC
  Administered 2019-04-24: 1000 mg via ORAL
  Filled 2019-04-24: qty 2

## 2019-04-24 MED ORDER — PROMETHAZINE HCL 12.5 MG PO TABS: 12.5000 mg | ORAL_TABLET | Freq: Four times a day (QID) | ORAL | 0 refills | Status: DC | PRN

## 2019-04-24 MED ORDER — OXYCODONE-ACETAMINOPHEN 5-325 MG PO TABS: 1.0000 | ORAL_TABLET | Freq: Once | ORAL | Status: DC

## 2019-04-24 MED ORDER — OXYCODONE-ACETAMINOPHEN 5-325 MG PO TABS: 1.0000 | ORAL_TABLET | Freq: Four times a day (QID) | ORAL | 0 refills | Status: DC | PRN

## 2019-04-24 NOTE — Discharge Instructions (Signed)
Miscarriage °A miscarriage is the loss of an unborn baby (fetus) before the 20th week of pregnancy. Most miscarriages happen during the first 3 months of pregnancy. Sometimes, a miscarriage can happen before a woman knows that she is pregnant. °Having a miscarriage can be an emotional experience. If you have had a miscarriage, talk with your health care provider about any questions you may have about miscarrying, the grieving process, and your plans for future pregnancy. °What are the causes? °A miscarriage may be caused by: °· Problems with the genes or chromosomes of the fetus. These problems make it impossible for the baby to develop normally. They are often the result of random errors that occur early in the development of the baby, and are not passed from parent to child (not inherited). °· Infection of the cervix or uterus. °· Conditions that affect hormone balance in the body. °· Problems with the cervix, such as the cervix opening and thinning before pregnancy is at term (cervical insufficiency). °· Problems with the uterus. These may include: °? A uterus with an abnormal shape. °? Fibroids in the uterus. °? Congenital abnormalities. These are problems that were present at birth. °· Certain medical conditions. °· Smoking, drinking alcohol, or using drugs. °· Injury (trauma). °In many cases, the cause of a miscarriage is not known. °What are the signs or symptoms? °Symptoms of this condition include: °· Vaginal bleeding or spotting, with or without cramps or pain. °· Pain or cramping in the abdomen or lower back. °· Passing fluid, tissue, or blood clots from the vagina. °How is this diagnosed? °This condition may be diagnosed based on: °· A physical exam. °· Ultrasound. °· Blood tests. °· Urine tests. °How is this treated? °Treatment for a miscarriage is sometimes not necessary if you naturally pass all the tissue that was in your uterus. If necessary, this condition may be treated with: °· Dilation and  curettage (D&C). This is a procedure in which the cervix is stretched open and the lining of the uterus (endometrium) is scraped. This is done only if tissue from the fetus or placenta remains in the body (incomplete miscarriage). °· Medicines, such as: °? Antibiotic medicine, to treat infection. °? Medicine to help the body pass any remaining tissue. °? Medicine to reduce (contract) the size of the uterus. These medicines may be given if you have a lot of bleeding. °If you have Rh negative blood and your baby was Rh positive, you will need a shot of a medicine called Rh immunoglobulinto protect your future babies from Rh blood problems. "Rh-negative" and "Rh-positive" refer to whether or not the blood has a specific protein found on the surface of red blood cells (Rh factor). °Follow these instructions at home: °Medicines ° °· Take over-the-counter and prescription medicines only as told by your health care provider. °· If you were prescribed antibiotic medicine, take it as told by your health care provider. Do not stop taking the antibiotic even if you start to feel better. °· Do not take NSAIDs, such as aspirin and ibuprofen, unless they are approved by your health care provider. These medicines can cause bleeding. °Activity °· Rest as directed. Ask your health care provider what activities are safe for you. °· Have someone help with home and family responsibilities during this time. °General instructions °· Keep track of the number of sanitary pads you use each day and how soaked (saturated) they are. Write down this information. °· Monitor the amount of tissue or blood clots that   you pass from your vagina. Save any large amounts of tissue for your health care provider to examine. °· Do not use tampons, douche, or have sex until your health care provider approves. °· To help you and your partner with the process of grieving, talk with your health care provider or seek counseling. °· When you are ready, meet with  your health care provider to discuss any important steps you should take for your health. Also, discuss steps you should take to have a healthy pregnancy in the future. °· Keep all follow-up visits as told by your health care provider. This is important. °Where to find more information °· The American [employer]nec[employer]: www.acog.org °· [place] Department of Health and Human Services Office of Women’s Health: www.womenshealth.gov °Contact a health care provider if: °· You have a fever or chills. °· You have a foul smelling vaginal discharge. °· You have more bleeding instead of less. °Get help right away if: °· You have severe cramps or pain in your back or abdomen. °· You pass blood clots or tissue from your vagina that is walnut-sized or larger. °· You soak more than 1 regular sanitary pad in an hour. °· You become light-headed or weak. °· You pass out. °· You have feelings of sadness that take over your thoughts, or you have thoughts of hurting yourself. °Summary °· Most miscarriages happen in the first 3 months of pregnancy. Sometimes miscarriage happens before a woman even knows that she is pregnant. °· Follow your health care provider's instruction for home care. Keep all follow-up appointments. °· To help you and your partner with the process of grieving, talk with your health care provider or seek counseling. °This information is not intended to replace advice given to you by your health care provider. Make sure you discuss any questions you have with your health care provider. °Document Released: 10/19/2000 Document Revised: 08/17/2018 Document Reviewed: 05/31/2016 °[employer]vier Patient Education © 2020 [employer] ° ° °Managing Pregnancy Loss °Pregnancy loss can happen any time during a pregnancy. Often the cause is not known. It is rarely because of anything you did. Pregnancy loss in early pregnancy (during the first trimester) is called a miscarriage. This type of pregnancy loss is  the most common. Pregnancy loss that happens after 20 weeks of pregnancy is called fetal demise if the baby's heart stops beating before birth. Fetal demise is much less common. Some women experience spontaneous labor shortly after fetal demise resulting in a stillborn birth (stillbirth). °Any pregnancy loss can be devastating. You will need to recover both physically and emotionally. Most women are able to get pregnant again after a pregnancy loss and deliver a healthy baby. °How to manage emotional recovery ° °Pregnancy loss is very hard emotionally. You may feel many different emotions while you grieve. You may feel sad and angry. You may also feel guilty. It is normal to have periods of crying. Emotional recovery can take longer than physical recovery. It is different for everyone. °Taking these steps can help you in managing this loss: °· Remember that it is unlikely you did anything to cause the pregnancy loss. °· Share your thoughts and feelings with friends, family, and your partner. Remember that your partner is also recovering emotionally. °· Make sure you have a good support system. Do not spend too much time alone. °· Meet with a pregnancy loss counselor or join a pregnancy loss support group. °· Get enough sleep and eat a healthy diet. Return   to regular exercise when you have recovered physically. °· Do not use drugs or alcohol to manage your emotions. °· Consider seeing a mental health professional to help you recover emotionally. °· Ask a friend or loved one to help you decide what to do with any clothing and nursery items you received for your baby. °In the case of a stillbirth, many women benefit from taking additional steps in the grieving process. You may want to: °· Hold your baby after the birth. °· Name your baby. °· Request a birth certificate. °· Create a keepsake such as handprints or footprints. °· Dress your baby and have a picture taken. °· Make funeral arrangements. °· Ask for a baptism  or blessing. °Hospitals have staff members who can help you with all these arrangements. °How to recognize emotional stress °It is normal to have emotional stress after a pregnancy loss. But emotional stress that lasts a long time or becomes severe requires treatment. Watch out for these signs of severe emotional stress: °· Sadness, anger, or guilt that is not going away and is interfering with your normal activities. °· Relationship problems that have occurred or gotten worse since the pregnancy loss. °· Signs of depression that last longer than 2 weeks. These may include: °? Sadness. °? Anxiety. °? Hopelessness. °? Loss of interest in activities you enjoy. °? Inability to concentrate. °? Trouble sleeping or sleeping too much. °? Loss of appetite or overeating. °? Thoughts of death or of hurting yourself. °Follow these instructions at home: °· Take over-the-counter and prescription medicines only as told by your health care provider. °· Rest at home until your energy level returns. Return to your normal activities as told by your health care provider. Ask your health care provider what activities are safe for you. °· When you are ready, meet with your health care provider to discuss steps to take for a future pregnancy. °· Keep all follow-up visits as told by your health care provider. This is important. °Where to find support °· To help you and your partner with the process of grieving, talk with your health care provider or seek counseling. °· Consider meeting with others who have experienced pregnancy loss. Ask your health care provider about support groups and resources. °Where to find more information °· [place] Department of Health and Human Services Office on Women's Health: www.womenshealth.gov °· American Pregnancy Association: www.americanpregnancy.org °Contact a health care provider if: °· You continue to experience grief, sadness, or lack of motivation for everyday activities, and those feelings do not  improve over time. °· You are struggling to recover emotionally, especially if you are using alcohol or substances to help. °Get help right away if: °· You have thoughts of hurting yourself or others. °If you ever feel like you may hurt yourself or others, or have thoughts about taking your own life, get help right away. You can go to your nearest emergency department or call: °· Your local emergency services (911 in the [place] °· A suicide crisis helpline, such as the National Suicide Prevention Lifeline at 1-800-273-8255. This is open 24 hours a day. °Summary °· Any pregnancy loss can be difficult physically and emotionally. °· You may experience many different emotions while you grieve. Emotional recovery can last longer than physical recovery. °· It is normal to have emotional stress after a pregnancy loss. But emotional stress that lasts a long time or becomes severe requires treatment. °· See your health care provider if you are struggling emotionally after a pregnancy   loss. °This information is not intended to replace advice given to you by your health care provider. Make sure you discuss any questions you have with your health care provider. °Document Released: 07/06/2017 Document Revised: 08/15/2018 Document Reviewed: 07/06/2017 °[employer] Patient Education © 2020 [employer] ° °

## 2019-04-24 NOTE — MAU Provider Note (Signed)
History     CSN: [id number]  Arrival date and time: 04/24/19 [id number]   First Provider Initiated Contact with Patient 04/24/19 0827      Chief Complaint  Patient presents with  . Abdominal Pain  . Vaginal Bleeding   Ms. Alexis Blanchard is a 23 y.o. [phone] at 3535w0d who presents to MAU for vaginal bleeding which began this morning. Pt reports bleeding only occurs when she uses the restroom. Pt denies wearing a pad. Pt was seen yesterday in MAU and found to have IUP without FHB, 4122w5d, suspicious but not definitive for failed pregnancy.  Passing blood clots? Single, minute Blood soaking clothes? no Lightheaded/dizzy? no Significant pelvic pain or cramping? yes, across lower abdomen, "makes me feel like I need to go to the bathroom every time", pt rates pain as 8/10 Passed any tissue? maybe  Current pregnancy problems? pt has not yet been seen Blood Type? B Positive Allergies? NKDA Current medications? PNVs Current [facility] & next appt? [facility]  Pt denies vaginal discharge/odor/itching. Pt denies N/V, constipation, diarrhea, or urinary problems. Pt denies fever, chills, fatigue, sweating or changes in appetite. Pt denies SOB or chest pain. Pt denies dizziness, HA, light-headedness, weakness.   OB History    Gravida  3   Para  1   Term  0   Preterm  1   AB  1   Living  1     SAB  1   TAB  0   Ectopic  0   Multiple  0   Live Births  1           Past Medical History:  Diagnosis Date  . Headache   . Hypertension    preE with previous pregnancy  . Medical history non-contributory   . Pregnancy induced hypertension     Past Surgical History:  Procedure Laterality Date  . CESAREAN SECTION N/A 06/08/2017   Procedure: CESAREAN SECTION;  Surgeon: Willodean RosenthalHarraway-Smith, Carolyn, MD;  [facility];  Service: Obstetrics;  Laterality: N/A;  . TONSILLECTOMY      Family History  Problem Relation Age of Onset  . Hypertension Mother    . Heart disease Maternal Aunt        CHF  . Diabetes Maternal Grandmother   . Hypertension Maternal Grandmother   . Heart disease Maternal Grandmother   . Cancer Maternal Aunt 45       colon  . Cancer Maternal Aunt 45       throat  . Congestive Heart Failure Maternal Uncle     Social History   Tobacco Use  . Smoking status: Never Smoker  . Smokeless tobacco: Never Used  Substance Use Topics  . Alcohol use: Not Currently    Comment: pt states that she has not used since finding out about the pregnancy  . Drug use: Yes    Types: Marijuana    Comment: pt states that she has not used since finding out about the pregnancy    Allergies: No Known Allergies  Medications Prior to Admission  Medication Sig Dispense Refill Last Dose  . Prenat w/o A Vit-FeFum-FePo-FA (CONCEPT OB) 130-92.4-1 MG CAPS Take 1 tablet by mouth daily. 30 capsule 12 04/23/2019 at 1300    Review of Systems  Constitutional: Negative for chills, diaphoresis, fatigue and fever.  Eyes: Negative for visual disturbance.  Respiratory: Negative for shortness of breath.   Cardiovascular: Negative for chest pain.  Gastrointestinal: Positive for abdominal pain. Negative for  constipation, diarrhea, nausea and vomiting.  Genitourinary: Positive for pelvic pain and vaginal bleeding. Negative for dysuria, flank pain, frequency, urgency and vaginal discharge.  Neurological: Negative for dizziness, weakness, light-headedness and headaches.   Physical Exam   Blood pressure 114/76, pulse 84, temperature 98.4 F (36.9 C), temperature source Oral, resp. rate 19, last menstrual period 02/13/2019, SpO2 98 %, unknown if currently breastfeeding.  Patient Vitals for the past 24 hrs:  BP Temp Temp src Pulse Resp SpO2  04/24/19 1127 114/76 98.4 F (36.9 C) Oral 84 19 98 %  04/24/19 0756 -- -- -- -- -- 100 %  04/24/19 0751 111/65 98.5 F (36.9 C) Oral 92 18 100 %   Physical Exam  Constitutional: She is oriented to person,  place, and time. She appears well-developed and well-nourished. No distress.  HENT:  Head: Normocephalic and atraumatic.  Respiratory: Effort normal.  GI: Soft. She exhibits no distension and no mass. There is no abdominal tenderness. There is no rebound and no guarding.  Neurological: She is alert and oriented to person, place, and time.  Skin: Skin is warm and dry. She is not diaphoretic.  Psychiatric: She has a normal mood and affect. Her behavior is normal. Judgment and thought content normal.  Pt declines pelvic exam.  Results for orders placed or performed during the hospital encounter of 04/24/19 (from the past 24 hour(s))  CBC     Status: Abnormal   Collection Time: 04/24/19  8:31 AM  Result Value Ref Range   WBC 4.6 4.0 - 10.5 K/uL   RBC 5.30 (H) 3.87 - 5.11 MIL/uL   Hemoglobin 13.9 12.0 - 15.0 g/dL   HCT 42.5 36.0 - 46.0 %   MCV 80.2 80.0 - 100.0 fL   MCH 26.2 26.0 - 34.0 pg   MCHC 32.7 30.0 - 36.0 g/dL   RDW 14.8 11.5 - 15.5 %   Platelets 346 150 - 400 K/uL   nRBC 0.0 0.0 - 0.2 %  Comprehensive metabolic panel     Status: Abnormal   Collection Time: 04/24/19  8:31 AM  Result Value Ref Range   Sodium 137 135 - 145 mmol/L   Potassium 4.0 3.5 - 5.1 mmol/L   Chloride 105 98 - 111 mmol/L   CO2 25 22 - 32 mmol/L   Glucose, Bld 100 (H) 70 - 99 mg/dL   BUN 8 6 - 20 mg/dL   Creatinine, Ser 0.66 0.44 - 1.00 mg/dL   Calcium 9.5 8.9 - 10.3 mg/dL   Total Protein 6.9 6.5 - 8.1 g/dL   Albumin 4.1 3.5 - 5.0 g/dL   AST 19 15 - 41 U/L   ALT 13 0 - 44 U/L   Alkaline Phosphatase 56 38 - 126 U/L   Total Bilirubin 0.6 0.3 - 1.2 mg/dL   GFR calc non Af Amer >60 >60 mL/min   GFR calc Af Amer >60 >60 mL/min   Anion gap 7 5 - 15  hCG, quantitative, pregnancy     Status: Abnormal   Collection Time: 04/24/19  8:31 AM  Result Value Ref Range   hCG, Beta Chain, Quant, S 13,833 (H) <5 mIU/mL  Urinalysis, Routine w reflex microscopic     Status: Abnormal   Collection Time: 04/24/19  8:53  AM  Result Value Ref Range   Color, Urine YELLOW YELLOW   APPearance CLEAR CLEAR   Specific Gravity, Urine 1.025 1.005 - 1.030   pH 6.0 5.0 - 8.0   Glucose, UA NEGATIVE NEGATIVE  mg/dL   Hgb urine dipstick MODERATE (A) NEGATIVE   Bilirubin Urine NEGATIVE NEGATIVE   Ketones, ur NEGATIVE NEGATIVE mg/dL   Protein, ur NEGATIVE NEGATIVE mg/dL   Nitrite NEGATIVE NEGATIVE   Leukocytes,Ua NEGATIVE NEGATIVE  Urinalysis, Microscopic (reflex)     Status: Abnormal   Collection Time: 04/24/19  8:53 AM  Result Value Ref Range   RBC / HPF 6-10 0 - 5 RBC/hpf   WBC, UA 0-5 0 - 5 WBC/hpf   Bacteria, UA RARE (A) NONE SEEN   Squamous Epithelial / LPF 0-5 0 - 5   Mucus PRESENT    US OB Transvaginal  Result Date: 04/24/2019 CLINICAL DATA:  Vaginal bleeding. EXAM: TRANSVAGINAL OB ULTRASOUND TECHNIQUE: Transvaginal ultrasound was performed for complete evaluation of the gestation as well as the maternal uterus, adnexal regions, and pelvic cul-de-sac. COMPARISON:  April 23, 2019 FINDINGS: Intrauterine gestational sac: Single. The gestational sac has moved from the fundus into the lower uterine segment and appears partially compressed. Yolk sac:  Visualized. Embryo:  Not definitively visualized. MSD: 12.69 mm   6 w   0 d CRL:     mm    w  d                  [place] EDC: Subchorionic hemorrhage:  Small Maternal uterus/adnexae: The ovaries are normal. IMPRESSION: 1. The gestational sac has migrated inferiorly from the fundal portion of the endometrium into the lower uterine segment and is now partially compressed. A fetal pole is not definitely seen today. A yolk sac is visualized. There is now a small subchorionic hemorrhage. The findings are consistent with a spontaneous abortion in progress. Recommend clinical correlation. Electronically Signed   By: Gerome Sam III M.D   On: 04/24/2019 09:45   US OB LESS THAN 14 WEEKS WITH OB TRANSVAGINAL  Result Date: 04/23/2019 CLINICAL DATA:  Vaginal bleeding EXAM:  OBSTETRIC <14 WK [place] AND TRANSVAGINAL OB US TECHNIQUE: Both transabdominal and transvaginal ultrasound examinations were performed for complete evaluation of the gestation as well as the maternal uterus, adnexal regions, and pelvic cul-de-sac. Transvaginal technique was performed to assess early pregnancy. COMPARISON:  None. FINDINGS: Intrauterine gestational sac: Visualized Yolk sac:  Visualized Embryo:  Visualized Cardiac Activity: Not visualized CRL:  3 mm   5 w   5 d                  [place] EDC: December 19, 2019 Subchorionic hemorrhage:  None visualized. Maternal uterus/adnexae: Cervical os is closed. Right ovary measures 2.5 x 1.2 x 2.1 cm. Left ovary measures 3.2 x 12.0 x 3.2 cm. No extrauterine pelvic mass or free pelvic fluid. IMPRESSION: There is a fetal pole evident without fetal heart activity appreciable. Findings are suspicious but not yet definitive for failed pregnancy. Recommend follow-up US in 10-14 days for definitive diagnosis. This recommendation follows SRU consensus guidelines: Diagnostic Criteria for Nonviable Pregnancy Early in the First Trimester. Malva Limes Med 2013; 829:5621-30. crown-rump length measures 3 mm which would indicate 6-weeks estimated gestation. Study otherwise unremarkable. Electronically Signed   By: Bretta Bang III M.D.   On: 04/23/2019 11:55   MAU Course  Procedures  MDM -r/o miscarriage -UA: mod hgb/rare bacteria, urine culture sent based on symptoms -CBC: WNL -CMP: WNL -[place]: inferior migration of gestational sac compared with yesterdays scan, partially compressed, no definite fetal pole, +sm subchorionic hemorrhage, findings consistent with spontaneous abortion in progress. -hCG: 86,578 (was 15,552 yesterday) -ABO: B Positive -WetPrep:  collected yesterday -GC/CT collected yesterday -  Tylenol and  Flexeril given for pain, pt reports pain now unchanged, but patient reports that the pain worsened d/t the ultrasound examination. Pt able to lay quietly  in bed without altered facial expression or vital signs. -discussed cytotec vs. expectant management, pt elects expectant management at this time, but requests medication for pain/N/V -Ibuprofen 600 mg 1 tablet by mouth every 6 hours as needed - prescribed  -Percocet 5/325 1-2 tabs every 6 hours as needed - prescribed  -Phenergan 12.5 mg by mouth every 6 hours as needed for nausea - prescribed -  ibuprofen given in MAU prior to discharge -pt discharged to home in stable condition  Orders Placed This Encounter  Procedures  . Culture, OB Urine    Standing Status:   Standing    Number of Occurrences:   1  . US OB Transvaginal    Standing Status:   Standing    Number of Occurrences:   1    Order Specific Question:   Symptom/Reason for Exam    Answer:   Vaginal bleeding in pregnancy [[id number]]  . CBC    Standing Status:   Standing    Number of Occurrences:   1  . Comprehensive metabolic panel    Standing Status:   Standing    Number of Occurrences:   1  . hCG, quantitative, pregnancy    Standing Status:   Standing    Number of Occurrences:   1  . Urinalysis, Routine w reflex microscopic    Standing Status:   Standing    Number of Occurrences:   1  . Urinalysis, Microscopic (reflex)    Standing Status:   Standing    Number of Occurrences:   1  . Discharge patient    Order Specific Question:   Discharge disposition    Answer:   01-Home or Self Care [1]    Order Specific Question:   Discharge patient date    Answer:   04/24/2019   Meds ordered this encounter  Medications  . DISCONTD: oxyCODONE-acetaminophen (PERCOCET/ROXICET) 5-325 MG per tablet 1 tablet  . acetaminophen (TYLENOL) tablet 1,000 mg  . cyclobenzaprine (FLEXERIL) tablet 10 mg  . ibuprofen (ADVIL) tablet 600 mg  . ibuprofen (ADVIL) 600 MG tablet    Sig: Take 1 tablet (600 mg total) by mouth every 6 (six) hours as needed for up to 30 doses for moderate pain or cramping.    Dispense:  30 tablet    Refill:  0     Order Specific Question:   Supervising Provider    Answer:   Adam Phenix [[id number]]  . oxyCODONE-acetaminophen (PERCOCET) 5-325 MG tablet    Sig: Take 1-2 tablets by mouth every 6 (six) hours as needed for up to 8 doses for severe pain.    Dispense:  8 tablet    Refill:  0    Order Specific Question:   Supervising Provider    Answer:   Adam Phenix [[id number]]  . promethazine (PHENERGAN) 12.5 MG tablet    Sig: Take 1 tablet (12.5 mg total) by mouth every 6 (six) hours as needed for nausea or vomiting.    Dispense:  30 tablet    Refill:  0    Order Specific Question:   Supervising Provider    Answer:   Adam Phenix [[id number]]   Assessment and Plan   1. Miscarriage   2. Vaginal bleeding in pregnancy   3. Blood type, Rh positive  Allergies as of 04/24/2019   No Known Allergies     Medication List    TAKE these medications   Concept OB 130-92.4-1 MG Caps Take 1 tablet by mouth daily.   ibuprofen 600 MG tablet Commonly known as: ADVIL Take 1 tablet (600 mg total) by mouth every 6 (six) hours as needed for up to 30 doses for moderate pain or cramping.   oxyCODONE-acetaminophen 5-325 MG tablet Commonly known as: Percocet Take 1-2 tablets by mouth every 6 (six) hours as needed for up to 8 doses for severe pain.   promethazine 12.5 MG tablet Commonly known as: PHENERGAN Take 1 tablet (12.5 mg total) by mouth every 6 (six) hours as needed for nausea or vomiting.      -RX ibuprofen -RX Percocet -RX Phenergan -discussed appropriate utilization of home meds, pt advised not to take Tylenol and Percocet together -discussed expected s/sx of miscarriage and warning signs warranting return to MAU -[facility] to cancel NOB appt and schedule hCG in one week and hCG/provider visit in two weeks -strict bleeding/pain/return MAU precautions given -pt discharged to home in stable condition  Alexis Blanchard 04/24/2019, 11:52 AM

## 2019-04-24 NOTE — MAU Note (Signed)
Presenst via EMS arrival with c/o "extreme pain" and VB.  Reports pain began last night, didn't take any pain meds.  Reports heavy bleeding when using restroom. Pt seen yesterday in MAU, states told measuring 5wks and no heartbeat seen.  Instructed to keep appt scheduled for next week.

## 2019-04-25 LAB — CULTURE, OB URINE: Culture: <10000 — AB

## 2019-05-01 ENCOUNTER — Encounter: Payer: Medicaid Other | Admitting: Certified Nurse Midwife

## 2019-05-01 ENCOUNTER — Other Ambulatory Visit: Payer: Medicaid Other

## 2019-05-06 ENCOUNTER — Other Ambulatory Visit: Payer: Medicaid Other

## 2019-05-06 ENCOUNTER — Other Ambulatory Visit: Payer: Self-pay

## 2019-05-06 DIAGNOSIS — O039 Complete or unspecified spontaneous abortion without complication: Secondary | ICD-10-CM

## 2019-05-07 LAB — BETA HCG QUANT (REF LAB): hCG Quant: 73 m[IU]/mL

## 2019-05-08 ENCOUNTER — Ambulatory Visit: Payer: Medicaid Other | Admitting: Family Medicine

## 2019-12-26 ENCOUNTER — Ambulatory Visit (HOSPITAL_COMMUNITY)
Admission: EM | Admit: 2019-12-26 | Discharge: 2019-12-26 | Disposition: A | Discharge status: Home / Self Care | Payer: Medicaid Other | Attending: Family Medicine | Admitting: Family Medicine

## 2019-12-26 ENCOUNTER — Encounter (HOSPITAL_COMMUNITY): Payer: Self-pay | Admitting: Emergency Medicine

## 2019-12-26 ENCOUNTER — Other Ambulatory Visit: Payer: Self-pay

## 2019-12-26 DIAGNOSIS — B349 Viral infection, unspecified: Secondary | ICD-10-CM | POA: Diagnosis not present

## 2019-12-26 DIAGNOSIS — Z20822 Contact with and (suspected) exposure to covid-19: Secondary | ICD-10-CM | POA: Insufficient documentation

## 2019-12-26 DIAGNOSIS — I10 Essential (primary) hypertension: Secondary | ICD-10-CM | POA: Insufficient documentation

## 2019-12-26 DIAGNOSIS — Z79899 Other long term (current) drug therapy: Secondary | ICD-10-CM | POA: Insufficient documentation

## 2019-12-26 DIAGNOSIS — Z8249 Family history of ischemic heart disease and other diseases of the circulatory system: Secondary | ICD-10-CM | POA: Insufficient documentation

## 2019-12-26 LAB — SARS CORONAVIRUS 2 (TAT 6-24 HRS): SARS Coronavirus 2: NEGATIVE

## 2019-12-26 NOTE — ED Provider Notes (Signed)
[facility]    CSN: [id number] Arrival date & time: 12/26/19  0930      History   Chief Complaint Chief Complaint  Patient presents with  . URI    HPI Alexis Blanchard is a 24 y.o. female presenting for evaluation of 3-day history of nasal congestion, sore throat, body aches, and headache for the past 3 days.  She works for a [employer] and had a Covid test through her work two days ago which was negative, however she is not sure what kind of test it was.  She has not been vaccinated against Covid.  Headache is predominantly left-sided and throbbing.  Cough sometimes with a lot of postnasal drainage.  Has felt warm but has not checked her fever.  Lives with sister and her 22-year-old son at home, both of which are well.  No known Covid or sick exposures that she is aware of.  Denies any associated shortness of breath, chest pain, lightheadedness/dizziness, visual changes, or extremity weakness.  Staying hydrated, drinking plenty of fluids.  Using TheraFlu with some relief.  Feels slightly better today but not back to normal.    Past Medical History:  Diagnosis Date  . Headache   . Hypertension    preE with previous pregnancy  . Medical history non-contributory   . Pregnancy induced hypertension     Patient Active Problem List   Diagnosis Date Noted  . Alpha thalassemia silent carrier 01/31/2017    Past Surgical History:  Procedure Laterality Date  . CESAREAN SECTION N/A 06/08/2017   Procedure: CESAREAN SECTION;  Surgeon: Willodean Rosenthal, MD;  Location: [facility];  Service: Obstetrics;  Laterality: N/A;  . TONSILLECTOMY      OB History    Gravida  3   Para  1   Term  0   Preterm  1   AB  1   Living  1     SAB  1   TAB  0   Ectopic  0   Multiple  0   Live Births  1            Home Medications    Prior to Admission medications   Medication Sig Start Date End Date Taking? Authorizing Provider  ibuprofen (ADVIL) 600  MG tablet Take 1 tablet (600 mg total) by mouth every 6 (six) hours as needed for up to 30 doses for moderate pain or cramping. 04/24/19   Nugent, Odie Sera, NP  oxyCODONE-acetaminophen (PERCOCET) 5-325 MG tablet Take 1-2 tablets by mouth every 6 (six) hours as needed for up to 8 doses for severe pain. 04/24/19   Nugent, Odie Sera, NP  Prenat w/o A Vit-FeFum-FePo-FA (CONCEPT OB) 130-92.4-1 MG CAPS Take 1 tablet by mouth daily. 01/16/17   Reva Bores, MD  promethazine (PHENERGAN) 12.5 MG tablet Take 1 tablet (12.5 mg total) by mouth every 6 (six) hours as needed for nausea or vomiting. 04/24/19   Nugent, Odie Sera, NP    Family History Family History  Problem Relation Age of Onset  . Hypertension Mother   . Heart disease Maternal Aunt        CHF  . Diabetes Maternal Grandmother   . Hypertension Maternal Grandmother   . Heart disease Maternal Grandmother   . Cancer Maternal Aunt 45       colon  . Cancer Maternal Aunt 45       throat  . Congestive Heart Failure Maternal Uncle  Social History Social History   Tobacco Use  . Smoking status: Never Smoker  . Smokeless tobacco: Never Used  Vaping Use  . Vaping Use: Never used  Substance Use Topics  . Alcohol use: Not Currently    Comment: pt states that she has not used since finding out about the pregnancy  . Drug use: Yes    Types: Marijuana    Comment: pt states that she has not used since finding out about the pregnancy     Allergies   Patient has no known allergies.   Review of Systems Review of Systems  Constitutional: Negative for appetite change, chills, fatigue and fever.  HENT: Positive for congestion, postnasal drip and rhinorrhea.   Respiratory: Negative for shortness of breath and wheezing.   Cardiovascular: Negative for chest pain.  Gastrointestinal: Negative for abdominal pain, constipation, diarrhea, nausea and vomiting.  Musculoskeletal: Positive for myalgias.  Neurological: Positive for headaches.  Negative for dizziness, syncope, weakness and light-headedness.    Physical Exam Triage Vital Signs ED Triage Vitals [12/26/19 1117]  Enc Vitals Group     BP 132/89     Pulse Rate 95     Resp 16     Temp 98.1 F (36.7 C)     Temp Source Oral     SpO2 98 %     Weight      Height      Head Circumference      Peak Flow      Pain Score 0     Pain Loc      Pain Edu?      Excl. in GC?    No data found.  Updated Vital Signs BP 132/89 (BP Location: Left Arm)   Pulse 95   Temp 98.1 F (36.7 C) (Oral)   Resp 16   LMP 02/10/2019   SpO2 98%   Breastfeeding Unknown   Physical Exam Constitutional:      General: She is not in acute distress.    Appearance: Normal appearance. She is not ill-appearing.  HENT:     Head: Normocephalic and atraumatic.     Nose: Congestion and rhinorrhea present.     Right Sinus: No maxillary sinus tenderness or frontal sinus tenderness.     Left Sinus: No maxillary sinus tenderness or frontal sinus tenderness.     Mouth/Throat:     Mouth: Mucous membranes are moist.     Pharynx: No oropharyngeal exudate or posterior oropharyngeal erythema.  Eyes:     Extraocular Movements: Extraocular movements intact.     Pupils: Pupils are equal, round, and reactive to light.  Cardiovascular:     Rate and Rhythm: Normal rate and regular rhythm.     Heart sounds: No murmur heard.   Pulmonary:     Effort: Pulmonary effort is normal.     Breath sounds: Normal breath sounds. No wheezing, rhonchi or rales.  Abdominal:     Palpations: Abdomen is soft.  Musculoskeletal:     Cervical back: Normal range of motion and neck supple.  Lymphadenopathy:     Cervical: No cervical adenopathy.  Skin:    General: Skin is warm and dry.     Capillary Refill: Capillary refill takes less than 2 seconds.     Findings: No rash.  Neurological:     General: No focal deficit present.     Mental Status: She is alert.  Psychiatric:        Mood and Affect: Mood normal.  Behavior: Behavior normal.     UC Treatments / Results  Labs (all labs ordered are listed, but only abnormal results are displayed) Labs Reviewed  SARS CORONAVIRUS 2 (TAT 6-24 HRS)    EKG   Radiology No results found.  Procedures Procedures (including critical care time)  Medications Ordered in UC Medications - No data to display  Initial Impression / Assessment and Plan / UC Course  I have reviewed the triage vital signs and the nursing notes.  Pertinent labs & imaging results that were available during my care of the patient were reviewed by me and considered in my medical decision making (see chart for details).    24 year old otherwise healthy female presenting for several day history of myalgias, headaches, and rhinorrhea/congestion with postnasal drip irritation.  While she recently had an unknown type Covid test result negative 2 days ago, given symptomatology and unvaccinated with frequent social interactions (works at [employer]), will retest today.  Likely viral etiology.  Reassuringly afebrile and appears comfortable with unremarkable pulmonary exam, suggesting against any concurrent pneumonia.  Recommended supportive care with tylenol/motrin/OTC decongestants and maintaining hydration.  Return to work pending Covid test and improvement, return precautions discussed.   Final Clinical Impressions(s) / UC Diagnoses   Final diagnoses:  Viral syndrome     Discharge Instructions     Wonderful to see you today!  We have retested you for Covid today, please wait for the results which should hopefully be back by tomorrow before proceeding with work.  Please frequently wash your hands and keep your mask on while you are having symptoms.  You can take Tylenol 650 mg alternated with ibuprofen 400 to 600 mg every 6 hours as needed to help with headache and aches.  To help with congestion, you can use an over-the-counter decongestant/mucinex to help which will likely  improve your throat irritation.  Make sure you stay well hydrated.  If you have any difficulty breathing or chest pain, go to the ED, or not improving in the next week please follow-up.    ED Prescriptions    None     PDMP not reviewed this encounter.   Allayne Stack, DO   [place], [place], [place] 12/26/19 1209

## 2019-12-26 NOTE — ED Triage Notes (Addendum)
Pt presents to [facility] for assessment of nasal congestion, sore throat, chills, body aches, and headache x 3 days.  Pt had negative COVID test 2 days ago.

## 2019-12-26 NOTE — Discharge Instructions (Addendum)
Wonderful to see you today!  We have retested you for Covid today, please wait for the results which should hopefully be back by tomorrow before proceeding with work.  Please frequently wash your hands and keep your mask on while you are having symptoms.  You can take Tylenol 650 mg alternated with ibuprofen 400 to 600 mg every 6 hours as needed to help with headache and aches.  To help with congestion, you can use an over-the-counter decongestant/mucinex to help which will likely improve your throat irritation.  Make sure you stay well hydrated.  If you have any difficulty breathing or chest pain, go to the ED, or not improving in the next week please follow-up.

## 2020-03-12 ENCOUNTER — Ambulatory Visit (HOSPITAL_COMMUNITY)
Admission: EM | Admit: 2020-03-12 | Discharge: 2020-03-12 | Disposition: A | Discharge status: Home / Self Care | Payer: Medicaid Other | Attending: Family Medicine | Admitting: Family Medicine

## 2020-03-12 ENCOUNTER — Encounter (HOSPITAL_COMMUNITY): Payer: Self-pay

## 2020-03-12 ENCOUNTER — Other Ambulatory Visit: Payer: Self-pay

## 2020-03-12 DIAGNOSIS — Z3202 Encounter for pregnancy test, result negative: Secondary | ICD-10-CM

## 2020-03-12 DIAGNOSIS — Z113 Encounter for screening for infections with a predominantly sexual mode of transmission: Secondary | ICD-10-CM | POA: Diagnosis not present

## 2020-03-12 LAB — POCT URINALYSIS DIPSTICK, ED / UC
Bilirubin Urine: NEGATIVE
Glucose, UA: NEGATIVE mg/dL
Hgb urine dipstick: NEGATIVE
Ketones, ur: NEGATIVE mg/dL
Leukocytes,Ua: NEGATIVE
Nitrite: NEGATIVE
Protein, ur: NEGATIVE mg/dL
Specific Gravity, Urine: 1.025 (ref 1.005–1.030)
Urobilinogen, UA: 0.2 mg/dL (ref 0.0–1.0)
pH: 5.5 (ref 5.0–8.0)

## 2020-03-12 LAB — POC URINE PREG, ED: Preg Test, Ur: NEGATIVE

## 2020-03-12 NOTE — ED Provider Notes (Signed)
[facility]    CSN: [id number] Arrival date & time: 03/12/20  0865      History   Chief Complaint Chief Complaint  Patient presents with  . Exposure to STD    HPI Alexis Blanchard is a 24 y.o. female.   HPI Patient presents for evaluation of possible STD exposure.Denies symptoms.  Patient's last menstrual period was 03/11/2019 (approximate).  Reports that her partner has had some penile irritation however is unaware that he has tested positive for any STDs. Patient denies any other concerns today.  Past Medical History:  Diagnosis Date  . Headache   . Hypertension    preE with previous pregnancy  . Medical history non-contributory   . Pregnancy induced hypertension     Patient Active Problem List   Diagnosis Date Noted  . Alpha thalassemia silent carrier 01/31/2017    Past Surgical History:  Procedure Laterality Date  . CESAREAN SECTION N/A 06/08/2017   Procedure: CESAREAN SECTION;  Surgeon: Willodean Rosenthal, MD;  Location: [facility] BIRTHING SUITES;  Service: Obstetrics;  Laterality: N/A;  . TONSILLECTOMY      OB History    Gravida  3   Para  1   Term  0   Preterm  1   AB  1   Living  1     SAB  1   TAB  0   Ectopic  0   Multiple  0   Live Births  1            Home Medications    Prior to Admission medications   Medication Sig Start Date End Date Taking? Authorizing Provider  ibuprofen (ADVIL) 600 MG tablet Take 1 tablet (600 mg total) by mouth every 6 (six) hours as needed for up to 30 doses for moderate pain or cramping. 04/24/19   Nugent, Odie Sera, NP  oxyCODONE-acetaminophen (PERCOCET) 5-325 MG tablet Take 1-2 tablets by mouth every 6 (six) hours as needed for up to 8 doses for severe pain. 04/24/19   Nugent, Odie Sera, NP  Prenat w/o A Vit-FeFum-FePo-FA (CONCEPT OB) 130-92.4-1 MG CAPS Take 1 tablet by mouth daily. 01/16/17   Reva Bores, MD  promethazine (PHENERGAN) 12.5 MG tablet Take 1 tablet (12.5 mg total) by  mouth every 6 (six) hours as needed for nausea or vomiting. 04/24/19   Nugent, Odie Sera, NP    Family History Family History  Problem Relation Age of Onset  . Hypertension Mother   . Heart disease Maternal Aunt        CHF  . Diabetes Maternal Grandmother   . Hypertension Maternal Grandmother   . Heart disease Maternal Grandmother   . Cancer Maternal Aunt 45       colon  . Cancer Maternal Aunt 45       throat  . Congestive Heart Failure Maternal Uncle     Social History Social History   Tobacco Use  . Smoking status: Never Smoker  . Smokeless tobacco: Never Used  Vaping Use  . Vaping Use: Never used  Substance Use Topics  . Alcohol use: Not Currently    Comment: pt states that she has not used since finding out about the pregnancy  . Drug use: Yes    Types: Marijuana    Comment: pt states that she has not used since finding out about the pregnancy     Allergies   Patient has no known allergies.   Review of Systems Review of Systems Pertinent  negatives listed in HPI  Physical Exam Triage Vital Signs ED Triage Vitals  Enc Vitals Group     BP 03/12/20 0906 112/76     Pulse Rate 03/12/20 0906 96     Resp 03/12/20 0906 18     Temp 03/12/20 0906 98.1 F (36.7 C)     Temp Source 03/12/20 0906 Oral     SpO2 03/12/20 0906 99 %     Weight --      Height --      Head Circumference --      Peak Flow --      Pain Score 03/12/20 0903 0     Pain Loc --      Pain Edu? --      Excl. in GC? --    No data found.  Updated Vital Signs BP 112/76 (BP Location: Left Arm)   Pulse 96   Temp 98.1 F (36.7 C) (Oral)   Resp 18   LMP 03/11/2019 (Approximate)   SpO2 99%   Breastfeeding No   Visual Acuity Right Eye Distance:   Left Eye Distance:   Bilateral Distance:    Right Eye Near:   Left Eye Near:    Bilateral Near:     Physical Exam General appearance: alert, well developed, well nourished, cooperative and in no distress Head: Normocephalic, without obvious  abnormality, atraumatic Respiratory: Respirations even and unlabored, normal respiratory rate Heart: rate and rhythm normal. No gallop or murmurs noted on exam  Abdomen: BS +, no distention, no rebound tenderness, or no mass Extremities: No gross deformities Skin: Skin color, texture, turgor normal. No rashes seen  Psych: Appropriate mood and affect. Vaginal cytology self collected.  UC Treatments / Results  Labs (all labs ordered are listed, but only abnormal results are displayed) Labs Reviewed  POCT URINALYSIS DIPSTICK, ED / UC    EKG   Radiology No results found.  Procedures Procedures (including critical care time)  Medications Ordered in UC Medications - No data to display  Initial Impression / Assessment and Plan / UC Course  I have reviewed the triage vital signs and the nursing notes.  Pertinent labs & imaging results that were available during my care of the patient were reviewed by me and considered in my medical decision making (see chart for details).    Urine pregnancy negative.  UA negative. patient is asymptomatic and chooses to wait on results supposed to being treated empirically for for STD.  Cytology sent, patient will be contacted with any positive results that require additional treatment. Patient to refrain from sexual activity for the next 7 days. Advised patient to notify any sexual partners if results are positive. Return precautions given.   Final Clinical Impressions(s) / UC Diagnoses   Final diagnoses:  Screen for STD (sexually transmitted disease)   Discharge Instructions   None    ED Prescriptions    None     PDMP not reviewed this encounter.   Bing Neighbors, FNP 03/12/20 1021

## 2020-03-12 NOTE — ED Triage Notes (Signed)
Pt in requesting std testing after recent exposure  Denies any vaginal discharge, irritation, dysuria, fever, or abdominal pain

## 2020-03-16 ENCOUNTER — Telehealth (HOSPITAL_COMMUNITY): Payer: Self-pay | Admitting: Emergency Medicine

## 2020-03-16 LAB — CERVICOVAGINAL ANCILLARY ONLY
Bacterial Vaginitis (gardnerella): POSITIVE — AB
Candida Glabrata: NEGATIVE
Candida Vaginitis: NEGATIVE
Chlamydia: NEGATIVE
Comment: NEGATIVE
Comment: NEGATIVE
Comment: NEGATIVE
Comment: NEGATIVE
Comment: NEGATIVE
Comment: NORMAL
Neisseria Gonorrhea: NEGATIVE
Trichomonas: NEGATIVE

## 2020-03-16 MED ORDER — METRONIDAZOLE 500 MG PO TABS: 500.0000 mg | ORAL_TABLET | Freq: Two times a day (BID) | ORAL | 0 refills | Status: DC

## 2020-04-08 ENCOUNTER — Ambulatory Visit (HOSPITAL_COMMUNITY)
Admission: EM | Admit: 2020-04-08 | Discharge: 2020-04-08 | Disposition: A | Discharge status: Home / Self Care | Payer: Medicaid Other | Attending: Family Medicine | Admitting: Family Medicine

## 2020-04-08 ENCOUNTER — Encounter (HOSPITAL_COMMUNITY): Payer: Self-pay | Admitting: Emergency Medicine

## 2020-04-08 ENCOUNTER — Other Ambulatory Visit: Payer: Self-pay

## 2020-04-08 DIAGNOSIS — N898 Other specified noninflammatory disorders of vagina: Secondary | ICD-10-CM | POA: Diagnosis not present

## 2020-04-08 DIAGNOSIS — Z3201 Encounter for pregnancy test, result positive: Secondary | ICD-10-CM | POA: Diagnosis not present

## 2020-04-08 DIAGNOSIS — R300 Dysuria: Secondary | ICD-10-CM | POA: Diagnosis not present

## 2020-04-08 LAB — POCT URINALYSIS DIPSTICK, ED / UC
Bilirubin Urine: NEGATIVE
Glucose, UA: NEGATIVE mg/dL
Nitrite: NEGATIVE
Protein, ur: NEGATIVE mg/dL
Specific Gravity, Urine: 1.015 (ref 1.005–1.030)
Urobilinogen, UA: 0.2 mg/dL (ref 0.0–1.0)
pH: 7 (ref 5.0–8.0)

## 2020-04-08 LAB — POC URINE PREG, ED: Preg Test, Ur: POSITIVE — AB

## 2020-04-08 MED ORDER — CEPHALEXIN 500 MG PO CAPS: 500.0000 mg | ORAL_CAPSULE | Freq: Four times a day (QID) | ORAL | 0 refills | Status: AC

## 2020-04-08 NOTE — ED Triage Notes (Signed)
Pt presents for pregnancy test after two at home positive test and vaginal irritation xs 1 day.

## 2020-04-08 NOTE — Discharge Instructions (Addendum)
Take Keflex 4 times daily for the next 7 days. Please establish care with an OB/GYN. Testing for yeast, BV, gonorrhea and chlamydia are in process at this time.

## 2020-04-08 NOTE — ED Provider Notes (Signed)
____________________________________________  Time seen: Approximately 4:52 PM  I have reviewed the triage vital signs and the nursing notes.   HISTORY  Chief Complaint Possible Pregnancy and Vaginitis   Historian Patient     HPI Alexis Blanchard is a 24 y.o. female with a history of headache and hypertension, presents to the urgent care after taking a positive at-home pregnancy test.  Patient states that she has had some low back pain but denies abdominal pain or pelvic discomfort.  No vaginal bleeding.  Patient states that she has had some vaginal irritation along the left aspect of her introitus that she noticed after she had intercourse.  She also endorses some mild dysuria.  No rash in the affected area.  No vaginal pruritus.  No changes in vaginal discharge.  She denies fever and chills at home.  No other alleviating measures have been attempted.   Past Medical History:  Diagnosis Date  . Headache   . Hypertension    preE with previous pregnancy  . Medical history non-contributory   . Pregnancy induced hypertension      Immunizations up to date:  Yes.     Past Medical History:  Diagnosis Date  . Headache   . Hypertension    preE with previous pregnancy  . Medical history non-contributory   . Pregnancy induced hypertension     Patient Active Problem List   Diagnosis Date Noted  . Alpha thalassemia silent carrier 01/31/2017    Past Surgical History:  Procedure Laterality Date  . CESAREAN SECTION N/A 06/08/2017   Procedure: CESAREAN SECTION;  Surgeon: Willodean Rosenthal, MD;  Location: [facility] BIRTHING SUITES;  Service: Obstetrics;  Laterality: N/A;  . TONSILLECTOMY      Prior to Admission medications   Medication Sig Start Date End Date Taking? Authorizing Provider  ibuprofen (ADVIL) 600 MG tablet Take 1 tablet (600 mg total) by mouth every 6 (six) hours as needed for up to 30 doses for moderate pain or cramping. 04/24/19   Nugent, Odie Sera, NP   metroNIDAZOLE (FLAGYL) 500 MG tablet Take 1 tablet (500 mg total) by mouth 2 (two) times daily. 03/16/20   Lamptey, Britta Mccreedy, MD  oxyCODONE-acetaminophen (PERCOCET) 5-325 MG tablet Take 1-2 tablets by mouth every 6 (six) hours as needed for up to 8 doses for severe pain. 04/24/19   Nugent, Odie Sera, NP  Prenat w/o A Vit-FeFum-FePo-FA (CONCEPT OB) 130-92.4-1 MG CAPS Take 1 tablet by mouth daily. 01/16/17   Reva Bores, MD  promethazine (PHENERGAN) 12.5 MG tablet Take 1 tablet (12.5 mg total) by mouth every 6 (six) hours as needed for nausea or vomiting. 04/24/19   Nugent, Odie Sera, NP    Allergies Patient has no known allergies.  Family History  Problem Relation Age of Onset  . Hypertension Mother   . Heart disease Maternal Aunt        CHF  . Diabetes Maternal Grandmother   . Hypertension Maternal Grandmother   . Heart disease Maternal Grandmother   . Cancer Maternal Aunt 45       colon  . Cancer Maternal Aunt 45       throat  . Congestive Heart Failure Maternal Uncle     Social History Social History   Tobacco Use  . Smoking status: Never Smoker  . Smokeless tobacco: Never Used  Vaping Use  . Vaping Use: Never used  Substance Use Topics  . Alcohol use: Not Currently    Comment: pt states that she  has not used since finding out about the pregnancy  . Drug use: Yes    Types: Marijuana    Comment: pt states that she has not used since finding out about the pregnancy     Review of Systems  Constitutional: No fever/chills Eyes:  No discharge ENT: No upper respiratory complaints. Respiratory: no cough. No SOB/ use of accessory muscles to breath Gastrointestinal:   No nausea, no vomiting.  No diarrhea.  No constipation. Musculoskeletal: Negative for musculoskeletal pain. Skin: Negative for rash, abrasions, lacerations, ecchymosis.    ____________________________________________   PHYSICAL EXAM:  VITAL SIGNS: ED Triage Vitals  Enc Vitals Group     BP 04/08/20  1534 (!) 145/81     Pulse Rate 04/08/20 1534 (!) 104     Resp 04/08/20 1534 16     Temp 04/08/20 1534 98.6 F (37 C)     Temp Source 04/08/20 1534 Oral     SpO2 04/08/20 1534 98 %     Weight --      Height --      Head Circumference --      Peak Flow --      Pain Score 04/08/20 1533 0     Pain Loc --      Pain Edu? --      Excl. in GC? --      Constitutional: Alert and oriented. Well appearing and in no acute distress. Eyes: Conjunctivae are normal. PERRL. EOMI. Head: Atraumatic.  Cardiovascular: Normal rate, regular rhythm. Normal S1 and S2.  Good peripheral circulation. Respiratory: Normal respiratory effort without tachypnea or retractions. Lungs CTAB. Good air entry to the bases with no decreased or absent breath sounds Gastrointestinal: Bowel sounds x 4 quadrants. Soft and nontender to palpation. No guarding or rigidity. No distention. Genitourinary: Patient has small fissure at the left side of introitus at the 5 o'clock position. Musculoskeletal: Full range of motion to all extremities. No obvious deformities noted Neurologic:  Normal for age. No gross focal neurologic deficits are appreciated.  Skin:  Skin is warm, dry and intact. No rash noted. Psychiatric: Mood and affect are normal for age. Speech and behavior are normal.   ____________________________________________   LABS (all labs ordered are listed, but only abnormal results are displayed)  Labs Reviewed  POC URINE PREG, ED - Abnormal; Notable for the following components:      Result Value   Preg Test, Ur POSITIVE (*)    All other components within normal limits  POCT URINALYSIS DIPSTICK, ED / UC - Abnormal; Notable for the following components:   Ketones, ur TRACE (*)    Hgb urine dipstick TRACE (*)    Leukocytes,Ua LARGE (*)    All other components within normal limits  CERVICOVAGINAL ANCILLARY ONLY    ____________________________________________  EKG   ____________________________________________  RADIOLOGY   No results found.  ____________________________________________    PROCEDURES  Procedure(s) performed:     Procedures     Medications - No data to display   ____________________________________________   INITIAL IMPRESSION / ASSESSMENT AND PLAN / ED COURSE  Pertinent labs & imaging results that were available during my care of the patient were reviewed by me and considered in my medical decision making (see chart for details).      Assessment and Plan:  Vaginal irritation  Need for pregnancy testing. 24 year old female presents to the urgent care with vaginal irritation and a request to seek confirmation of pregnancy.  Patient was mildly tachycardic at triage  but vital signs were otherwise reassuring.  Patient's urine pregnancy test was positive.  Patient had a small fissure at introitus.  Patient also elected to undergo testing for yeast, BV, gonorrhea and chlamydia which are currently in process. Patient  does not have concern for STDs and does not want to initiate empiric treatment for STDs at this time.  Urinalysis indicated a large amount of leukocytes and a small amount of blood concerning for UTI.  Will treat with Keflex 4 times daily for the next 7 days and will recommend that patient establish care with an OB/GYN.  Recommended starting prenatal vitamins daily.  Return precautions were given to return with new or worsening symptoms.  All patient questions were answered.   ____________________________________________  FINAL CLINICAL IMPRESSION(S) / ED DIAGNOSES  Final diagnoses:  Vaginal irritation  Dysuria  Positive pregnancy test      NEW MEDICATIONS STARTED DURING THIS VISIT:  ED Discharge Orders    None          This chart was dictated using voice recognition software/Dragon. Despite best efforts to proofread, errors can  occur which can change the meaning. Any change was purely unintentional.     Orvil Feil, PA-C 04/08/20 1704

## 2020-04-09 ENCOUNTER — Telehealth (HOSPITAL_COMMUNITY): Payer: Self-pay | Admitting: Emergency Medicine

## 2020-04-09 LAB — CERVICOVAGINAL ANCILLARY ONLY
Bacterial Vaginitis (gardnerella): NEGATIVE
Candida Glabrata: NEGATIVE
Candida Vaginitis: POSITIVE — AB
Chlamydia: NEGATIVE
Comment: NEGATIVE
Comment: NEGATIVE
Comment: NEGATIVE
Comment: NEGATIVE
Comment: NEGATIVE
Comment: NORMAL
Neisseria Gonorrhea: NEGATIVE
Trichomonas: NEGATIVE

## 2020-04-09 MED ORDER — TERCONAZOLE 0.4 % VA CREA: 1.0000 | TOPICAL_CREAM | Freq: Every day | VAGINAL | 0 refills | Status: AC

## 2020-04-15 DIAGNOSIS — Z3009 Encounter for other general counseling and advice on contraception: Secondary | ICD-10-CM | POA: Diagnosis not present

## 2020-04-15 DIAGNOSIS — Z3200 Encounter for pregnancy test, result unknown: Secondary | ICD-10-CM | POA: Diagnosis not present

## 2020-05-09 NOTE — L&D Delivery Note (Addendum)
Delivery Note At 5:56 AM a viable and healthy female was delivered via VBAC, Spontaneous (Presentation: Left Occiput Anterior).  APGAR: 8, 9; Placenta status: Spontaneous, Intact.  Cord: 3 vessels with the following complications: None.  Cord pH: N/A  Called to room and patient was complete and pushing. Head delivered Left OA. Nuchal cord present x1, delivered through. Shoulder and body delivered in usual fashion. Infant with spontaneous cry, placed on mother's abdomen, dried and stimulated. Cord clamped x 2 after 1-minute delay, and cut by FOB under direct supervision. Cord blood drawn. Placenta delivered spontaneously with gentle cord traction. Fundus firm with massage and Pitocin. Labia, perineum, vagina, and cervix were inspected, Bilateral labial tears.    Anesthesia: Epidural Episiotomy: None Lacerations: Bilateral Labial tears Suture Repair: 3.0 vicryl Est. Blood Loss (mL):  150  Mom to postpartum.  Baby to Couplet care / Skin to Skin.  Alfredo Martinez, PGY1 11/24/2020, 6:17 AM  GME ATTESTATION:  I saw and evaluated the patient. I agree with the findings and the plan of care as documented in the resident's note. I was gowned and gloved for entirety of procedure.  Alric Seton, MD OB Fellow, Faculty [facility], Center for [facility] 11/24/2020 6:58 AM

## 2020-05-15 ENCOUNTER — Encounter (HOSPITAL_COMMUNITY): Payer: Self-pay | Admitting: Obstetrics & Gynecology

## 2020-05-15 ENCOUNTER — Inpatient Hospital Stay (HOSPITAL_COMMUNITY)
Admission: AD | Admit: 2020-05-15 | Discharge: 2020-05-16 | Disposition: A | Discharge status: Home / Self Care | Payer: Medicaid Other | Attending: Obstetrics & Gynecology | Admitting: Obstetrics & Gynecology

## 2020-05-15 ENCOUNTER — Other Ambulatory Visit: Payer: Self-pay

## 2020-05-15 DIAGNOSIS — Z3A09 9 weeks gestation of pregnancy: Secondary | ICD-10-CM | POA: Diagnosis not present

## 2020-05-15 DIAGNOSIS — B9689 Other specified bacterial agents as the cause of diseases classified elsewhere: Secondary | ICD-10-CM | POA: Diagnosis not present

## 2020-05-15 DIAGNOSIS — O26851 Spotting complicating pregnancy, first trimester: Secondary | ICD-10-CM

## 2020-05-15 DIAGNOSIS — O209 Hemorrhage in early pregnancy, unspecified: Secondary | ICD-10-CM | POA: Insufficient documentation

## 2020-05-15 DIAGNOSIS — O23591 Infection of other part of genital tract in pregnancy, first trimester: Secondary | ICD-10-CM | POA: Insufficient documentation

## 2020-05-15 DIAGNOSIS — O99891 Other specified diseases and conditions complicating pregnancy: Secondary | ICD-10-CM | POA: Diagnosis not present

## 2020-05-15 DIAGNOSIS — N760 Acute vaginitis: Secondary | ICD-10-CM | POA: Diagnosis not present

## 2020-05-15 DIAGNOSIS — O4690 Antepartum hemorrhage, unspecified, unspecified trimester: Secondary | ICD-10-CM

## 2020-05-15 LAB — WET PREP, GENITAL
Sperm: NONE SEEN
Trich, Wet Prep: NONE SEEN
Yeast Wet Prep HPF POC: NONE SEEN

## 2020-05-15 NOTE — MAU Note (Signed)
PT SAYS AT 1030PM- WENT TO B-ROOM- HAD BM, SAW LIGHT PINK WHEN SHE WIPED - FROM VAGINA . Mar[facility] Kitchen LAST SEX- Thursday NIGHT . [facility] WITH  [facility] -   FIRST APPOINTMENT  IS 05-19-20.

## 2020-05-16 ENCOUNTER — Inpatient Hospital Stay (HOSPITAL_COMMUNITY): Payer: Medicaid Other

## 2020-05-16 DIAGNOSIS — Z3A09 9 weeks gestation of pregnancy: Secondary | ICD-10-CM | POA: Diagnosis not present

## 2020-05-16 DIAGNOSIS — O99891 Other specified diseases and conditions complicating pregnancy: Secondary | ICD-10-CM | POA: Diagnosis not present

## 2020-05-16 DIAGNOSIS — B9689 Other specified bacterial agents as the cause of diseases classified elsewhere: Secondary | ICD-10-CM | POA: Diagnosis not present

## 2020-05-16 DIAGNOSIS — O209 Hemorrhage in early pregnancy, unspecified: Secondary | ICD-10-CM | POA: Diagnosis not present

## 2020-05-16 DIAGNOSIS — O26851 Spotting complicating pregnancy, first trimester: Secondary | ICD-10-CM

## 2020-05-16 DIAGNOSIS — N760 Acute vaginitis: Secondary | ICD-10-CM | POA: Diagnosis not present

## 2020-05-16 LAB — CBC
HCT: 40.6 % (ref 36.0–46.0)
Hemoglobin: 13.3 g/dL (ref 12.0–15.0)
MCH: 26.7 pg (ref 26.0–34.0)
MCHC: 32.8 g/dL (ref 30.0–36.0)
MCV: 81.4 fL (ref 80.0–100.0)
Platelets: 326 10*3/uL (ref 150–400)
RBC: 4.99 MIL/uL (ref 3.87–5.11)
RDW: 13.7 % (ref 11.5–15.5)
WBC: 9.9 10*3/uL (ref 4.0–10.5)
nRBC: 0 % (ref 0.0–0.2)

## 2020-05-16 LAB — URINALYSIS, ROUTINE W REFLEX MICROSCOPIC
Bilirubin Urine: NEGATIVE
Glucose, UA: NEGATIVE mg/dL
Ketones, ur: NEGATIVE mg/dL
Nitrite: NEGATIVE
Protein, ur: 30 mg/dL — AB
RBC / HPF: >50 RBC/hpf — ABNORMAL HIGH (ref 0–5)
Specific Gravity, Urine: 1.016 (ref 1.005–1.030)
WBC, UA: >50 WBC/hpf — ABNORMAL HIGH (ref 0–5)
pH: 6 (ref 5.0–8.0)

## 2020-05-16 LAB — HCG, QUANTITATIVE, PREGNANCY: hCG, Beta Chain, Quant, S: 121504 m[IU]/mL — ABNORMAL HIGH (ref <5)

## 2020-05-16 MED ORDER — METRONIDAZOLE 0.75 % VA GEL: 1.0000 | Freq: Every day | VAGINAL | 0 refills | Status: DC

## 2020-05-16 NOTE — MAU Provider Note (Signed)
History     CSN: [id number]  Arrival date and time: 05/15/20 2237   Event Date/Time   First Provider Initiated Contact with Patient 05/16/20 0303      Chief Complaint  Patient presents with  . Vaginal Bleeding   Alexis Blanchard is a 25 y.o. [phone] at [redacted]w[redacted]d who receives care at [facility].  She presents today for Vaginal Bleeding.  She reports spotting after a bowel movement.  Patient states it was light pink and noted with wiping.  Patient denies abdominal cramping or passing of clots.  Patient reports resolution of spotting since awaiting assessment.  However, she states the spotting went from pink to light brown. Patient endorses sexual activity within the past 3 days. Patient reports some irritation with urination, but denies dysuria or hesitancy.  Patient also denies vaginal discharge.    OB History    Gravida  4   Para  1   Term  0   Preterm  1   AB  1   Living  1     SAB  1   IAB  0   Ectopic  0   Multiple  0   Live Births  1           Past Medical History:  Diagnosis Date  . Headache   . Hypertension    preE with previous pregnancy  . Medical history non-contributory   . Pregnancy induced hypertension     Past Surgical History:  Procedure Laterality Date  . CESAREAN SECTION N/A 06/08/2017   Procedure: CESAREAN SECTION;  Surgeon: Willodean Rosenthal, MD;  Location: [facility] BIRTHING SUITES;  Service: Obstetrics;  Laterality: N/A;  . TONSILLECTOMY      Family History  Problem Relation Age of Onset  . Hypertension Mother   . Heart disease Maternal Aunt        CHF  . Diabetes Maternal Grandmother   . Hypertension Maternal Grandmother   . Heart disease Maternal Grandmother   . Cancer Maternal Aunt 45       colon  . Cancer Maternal Aunt 45       throat  . Congestive Heart Failure Maternal Uncle     Social History   Tobacco Use  . Smoking status: Never Smoker  . Smokeless tobacco: Never Used  Vaping Use  . Vaping Use: Never used   Substance Use Topics  . Alcohol use: Not Currently    Comment: pt states that she has not used since finding out about the pregnancy  . Drug use: Yes    Types: Marijuana    Comment: pt states that she has not used since finding out about the pregnancy    Allergies: No Known Allergies  Medications Prior to Admission  Medication Sig Dispense Refill Last Dose  . ibuprofen (ADVIL) 600 MG tablet Take 1 tablet (600 mg total) by mouth every 6 (six) hours as needed for up to 30 doses for moderate pain or cramping. 30 tablet 0   . metroNIDAZOLE (FLAGYL) 500 MG tablet Take 1 tablet (500 mg total) by mouth 2 (two) times daily. 14 tablet 0   . oxyCODONE-acetaminophen (PERCOCET) 5-325 MG tablet Take 1-2 tablets by mouth every 6 (six) hours as needed for up to 8 doses for severe pain. 8 tablet 0   . Prenat w/o A Vit-FeFum-FePo-FA (CONCEPT OB) 130-92.4-1 MG CAPS Take 1 tablet by mouth daily. 30 capsule 12   . promethazine (PHENERGAN) 12.5 MG tablet Take 1 tablet (12.5 mg total)  by mouth every 6 (six) hours as needed for nausea or vomiting. 30 tablet 0     Review of Systems  Constitutional: Negative for chills and fever.  Respiratory: Negative for cough and shortness of breath.   Gastrointestinal: Negative for abdominal pain, nausea and vomiting.  Genitourinary: Positive for vaginal bleeding (Spotting). Negative for difficulty urinating, dyspareunia, dysuria, pelvic pain and vaginal discharge.  Musculoskeletal: Negative for back pain.  Neurological: Negative for dizziness, light-headedness and headaches.   Physical Exam   Blood pressure 121/80, pulse 93, temperature 98.4 F (36.9 C), temperature source Oral, resp. rate 20, height 5\' 1"  (1.549 m), weight 54.3 kg, last menstrual period 03/09/2020.  Physical Exam Constitutional:      Appearance: Normal appearance.  HENT:     Head: Normocephalic and atraumatic.  Eyes:     Conjunctiva/sclera: Conjunctivae normal.  Cardiovascular:     Rate and  Rhythm: Normal rate and regular rhythm.     Heart sounds: Normal heart sounds.  Pulmonary:     Effort: Pulmonary effort is normal. No respiratory distress.     Breath sounds: Normal breath sounds.  Abdominal:     General: Bowel sounds are normal. There is no distension.     Tenderness: There is no abdominal tenderness.  Musculoskeletal:        General: Normal range of motion.     Cervical back: Normal range of motion.  Skin:    General: Skin is warm and dry.  Neurological:     Mental Status: She is alert and oriented to person, place, and time.  Psychiatric:        Mood and Affect: Mood normal.        Behavior: Behavior normal.        Thought Content: Thought content normal.     MAU Course  Procedures Results for orders placed or performed during the hospital encounter of 05/15/20 (from the past 24 hour(s))  Wet prep, genital     Status: Abnormal   Collection Time: 05/15/20 11:12 PM  Result Value Ref Range   Yeast Wet Prep HPF POC NONE SEEN NONE SEEN   Trich, Wet Prep NONE SEEN NONE SEEN   Clue Cells Wet Prep HPF POC PRESENT (A) NONE SEEN   WBC, Wet Prep HPF POC MANY (A) NONE SEEN   Sperm NONE SEEN   hCG, quantitative, pregnancy     Status: Abnormal   Collection Time: 05/16/20 12:30 AM  Result Value Ref Range   hCG, Beta Chain, Quant, S 121,504 (H) <5 mIU/mL  CBC     Status: None   Collection Time: 05/16/20 12:30 AM  Result Value Ref Range   WBC 9.9 4.0 - 10.5 K/uL   RBC 4.99 3.87 - 5.11 MIL/uL   Hemoglobin 13.3 12.0 - 15.0 g/dL   HCT 07/14/20 28.3 - 15.1 %   MCV 81.4 80.0 - 100.0 fL   MCH 26.7 26.0 - 34.0 pg   MCHC 32.8 30.0 - 36.0 g/dL   RDW 76.1 60.7 - 37.1 %   Platelets 326 150 - 400 K/uL   nRBC 0.0 0.0 - 0.2 %   06.2 OB Comp Less 14 Wks  Result Date: 05/16/2020 CLINICAL DATA:  Vaginal bleeding, quantitative hCG 121,504 EXAM: OBSTETRIC <14 WK ULTRASOUND TECHNIQUE: Transabdominal ultrasound was performed for evaluation of the gestation as well as the maternal uterus  and adnexal regions. COMPARISON:  Prior gestation obstetrical ultrasound 04/24/2019 FINDINGS: Intrauterine gestational sac: Single Yolk sac:  Visualized. Embryo:  Visualized. Cardiac  Activity: Visualized. Heart Rate: 164 bpm CRL:   29.9 mm   9 w 5 d                  [place] EDC: 12/14/2020 Subchorionic hemorrhage:  None visualized. Maternal uterus/adnexae: Maternal uterus is otherwise unremarkable. Normal appearance of the bilateral ovaries without concerning adnexal lesion. No free fluid in the pelvis. IMPRESSION: Single intrauterine gestation at 9 weeks, 5 days by crown-rump length sonographic estimation. No acute sonographic complications are evident. Electronically Signed   By: Kreg Shropshire M.D.   On: 05/16/2020 02:35    MDM Wet Prep and GC/CT Labs: UA, UPT, CBC, hCG Ultrasound Assessment and Plan  25 year old, [id number]  SIUP at 9.5weeks Vaginal spotting Bacterial Vaginosis  -Patient able to self-swab and [place] completed from waiting room due to acuity and room availability.  -Results discussed with patient and questions/concerns addressed.  -Patient informed of BV and how this can contribute to vaginal irritation and spotting. -Rx for metrogel sent to pharmacy on file.  -Reviewed US findings c/w LMP. -Plan for follow up with primary ob as scheduled. -Bleeding precautions given. -Encouraged to call or return to MAU if symptoms worsen or with the onset of new symptoms. -Discharged to home in stable condition.    Cherre Robins 05/16/2020, 3:03 AM

## 2020-05-16 NOTE — Discharge Instructions (Signed)
Bacterial Vaginosis  Bacterial vaginosis is a vaginal infection that occurs when the normal balance of bacteria in the vagina is disrupted. It results from an overgrowth of certain bacteria. This is the most common vaginal infection among women ages 15-44. Because bacterial vaginosis increases your risk for STIs (sexually transmitted infections), getting treated can help reduce your risk for chlamydia, gonorrhea, herpes, and HIV (human immunodeficiency virus). Treatment is also important for preventing complications in pregnant women, because this condition can cause an early (premature) delivery. What are the causes? This condition is caused by an increase in harmful bacteria that are normally present in small amounts in the vagina. However, the reason that the condition develops is not fully understood. What increases the risk? The following factors may make you more likely to develop this condition:  Having a new sexual partner or multiple sexual partners.  Having unprotected sex.  Douching.  Having an intrauterine device (IUD).  Smoking.  Drug and alcohol abuse.  Taking certain antibiotic medicines.  Being pregnant. You cannot get bacterial vaginosis from toilet seats, bedding, swimming pools, or contact with objects around you. What are the signs or symptoms? Symptoms of this condition include:  Grey or white vaginal discharge. The discharge can also be watery or foamy.  A fish-like odor with discharge, especially after sexual intercourse or during menstruation.  Itching in and around the vagina.  Burning or pain with urination. Some women with bacterial vaginosis have no signs or symptoms. How is this diagnosed? This condition is diagnosed based on:  Your medical history.  A physical exam of the vagina.  Testing a sample of vaginal fluid under a microscope to look for a large amount of bad bacteria or abnormal cells. Your health care provider may use a cotton swab or  a small wooden spatula to collect the sample. How is this treated? This condition is treated with antibiotics. These may be given as a pill, a vaginal cream, or a medicine that is put into the vagina (suppository). If the condition comes back after treatment, a second round of antibiotics may be needed. Follow these instructions at home: Medicines  Take over-the-counter and prescription medicines only as told by your health care provider.  Take or use your antibiotic as told by your health care provider. Do not stop taking or using the antibiotic even if you start to feel better. General instructions  If you have a female sexual partner, tell her that you have a vaginal infection. She should see her health care provider and be treated if she has symptoms. If you have a female sexual partner, he does not need treatment.  During treatment: ? Avoid sexual activity until you finish treatment. ? Do not douche. ? Avoid alcohol as directed by your health care provider. ? Avoid breastfeeding as directed by your health care provider.  Drink enough water and fluids to keep your urine clear or pale yellow.  Keep the area around your vagina and rectum clean. ? Wash the area daily with warm water. ? Wipe yourself from front to back after using the toilet.  Keep all follow-up visits as told by your health care provider. This is important. How is this prevented?  Do not douche.  Wash the outside of your vagina with warm water only.  Use protection when having sex. This includes latex condoms and dental dams.  Limit how many sexual partners you have. To help prevent bacterial vaginosis, it is best to have sex with just one partner (  monogamous).  Make sure you and your sexual partner are tested for STIs.  Wear cotton or cotton-lined underwear.  Avoid wearing tight pants and pantyhose, especially during summer.  Limit the amount of alcohol that you drink.  Do not use any products that contain  nicotine or tobacco, such as cigarettes and e-cigarettes. If you need help quitting, ask your health care provider.  Do not use illegal drugs. Where to find more information  Centers for Disease Control and Prevention: SolutionApps.co.za  American Sexual Health Association (ASHA): www.ashastd.org  U.S. Department of Health and Health and safety inspector, Office on Women's Health: ConventionalMedicines.si or http://www.anderson-williamson.info/ Contact a health care provider if:  Your symptoms do not improve, even after treatment.  You have more discharge or pain when urinating.  You have a fever.  You have pain in your abdomen.  You have pain during sex.  You have vaginal bleeding between periods. Summary  Bacterial vaginosis is a vaginal infection that occurs when the normal balance of bacteria in the vagina is disrupted.  Because bacterial vaginosis increases your risk for STIs (sexually transmitted infections), getting treated can help reduce your risk for chlamydia, gonorrhea, herpes, and HIV (human immunodeficiency virus). Treatment is also important for preventing complications in pregnant women, because the condition can cause an early (premature) delivery.  This condition is treated with antibiotic medicines. These may be given as a pill, a vaginal cream, or a medicine that is put into the vagina (suppository). This information is not intended to replace advice given to you by your health care provider. Make sure you discuss any questions you have with your health care provider. Document Revised: 04/07/2017 Document Reviewed: 01/09/2016 Elsevier Patient Education  2020 [employer]. First Trimester of Pregnancy The first trimester of pregnancy is from week 1 until the end of week 13 (months 1 through 3). A week after a sperm fertilizes an egg, the egg will implant on the wall of the uterus. This embryo will begin to develop into a baby. Genes from you and your partner  will form the baby. The female genes will determine whether the baby will be a boy or a girl. At 6-8 weeks, the eyes and face will be formed, and the heartbeat can be seen on ultrasound. At the end of 12 weeks, all the baby's organs will be formed. Now that you are pregnant, you will want to do everything you can to have a healthy baby. Two of the most important things are to get good prenatal care and to follow your health care provider's instructions. Prenatal care is all the medical care you receive before the baby's birth. This care will help prevent, find, and treat any problems during the pregnancy and childbirth. Body changes during your first trimester Your body goes through many changes during pregnancy. The changes vary from woman to woman.  You may gain or lose a couple of pounds at first.  You may feel sick to your stomach (nauseous) and you may throw up (vomit). If the vomiting is uncontrollable, call your health care provider.  You may tire easily.  You may develop headaches that can be relieved by medicines. All medicines should be approved by your health care provider.  You may urinate more often. Painful urination may mean you have a bladder infection.  You may develop heartburn as a result of your pregnancy.  You may develop constipation because certain hormones are causing the muscles that push stool through your intestines to slow down.  You may  develop hemorrhoids or swollen veins (varicose veins).  Your breasts may begin to grow larger and become tender. Your nipples may stick out more, and the tissue that surrounds them (areola) may become darker.  Your gums may bleed and may be sensitive to brushing and flossing.  Dark spots or blotches (chloasma, mask of pregnancy) may develop on your face. This will likely fade after the baby is born.  Your menstrual periods will stop.  You may have a loss of appetite.  You may develop cravings for certain kinds of food.  You  may have changes in your emotions from day to day, such as being excited to be pregnant or being concerned that something may go wrong with the pregnancy and baby.  You may have more vivid and strange dreams.  You may have changes in your hair. These can include thickening of your hair, rapid growth, and changes in texture. Some women also have hair loss during or after pregnancy, or hair that feels dry or thin. Your hair will most likely return to normal after your baby is born. What to expect at prenatal visits During a routine prenatal visit:  You will be weighed to make sure you and the baby are growing normally.  Your blood pressure will be taken.  Your abdomen will be measured to track your baby's growth.  The fetal heartbeat will be listened to between weeks 10 and 14 of your pregnancy.  Test results from any previous visits will be discussed. Your health care provider may ask you:  How you are feeling.  If you are feeling the baby move.  If you have had any abnormal symptoms, such as leaking fluid, bleeding, severe headaches, or abdominal cramping.  If you are using any tobacco products, including cigarettes, chewing tobacco, and electronic cigarettes.  If you have any questions. Other tests that may be performed during your first trimester include:  Blood tests to find your blood type and to check for the presence of any previous infections. The tests will also be used to check for low iron levels (anemia) and protein on red blood cells (Rh antibodies). Depending on your risk factors, or if you previously had diabetes during pregnancy, you may have tests to check for high blood sugar that affects pregnant women (gestational diabetes).  Urine tests to check for infections, diabetes, or protein in the urine.  An ultrasound to confirm the proper growth and development of the baby.  Fetal screens for spinal cord problems (spina bifida) and Down syndrome.  HIV (human  immunodeficiency virus) testing. Routine prenatal testing includes screening for HIV, unless you choose not to have this test.  You may need other tests to make sure you and the baby are doing well. Follow these instructions at home: Medicines  Follow your health care provider's instructions regarding medicine use. Specific medicines may be either safe or unsafe to take during pregnancy.  Take a prenatal vitamin that contains at least 600 micrograms (mcg) of folic acid.  If you develop constipation, try taking a stool softener if your health care provider approves. Eating and drinking   Eat a balanced diet that includes fresh fruits and vegetables, whole grains, good sources of protein such as meat, eggs, or tofu, and low-fat dairy. Your health care provider will help you determine the amount of weight gain that is right for you.  Avoid raw meat and uncooked cheese. These carry germs that can cause birth defects in the baby.  Eating four or  five small meals rather than three large meals a day may help relieve nausea and vomiting. If you start to feel nauseous, eating a few soda crackers can be helpful. Drinking liquids between meals, instead of during meals, also seems to help ease nausea and vomiting.  Limit foods that are high in fat and processed sugars, such as fried and sweet foods.  To prevent constipation: ? Eat foods that are high in fiber, such as fresh fruits and vegetables, whole grains, and beans. ? Drink enough fluid to keep your urine clear or pale yellow. Activity  Exercise only as directed by your health care provider. Most women can continue their usual exercise routine during pregnancy. Try to exercise for 30 minutes at least 5 days a week. Exercising will help you: ? Control your weight. ? Stay in shape. ? Be prepared for labor and delivery.  Experiencing pain or cramping in the lower abdomen or lower back is a good sign that you should stop exercising. Check with  your health care provider before continuing with normal exercises.  Try to avoid standing for long periods of time. Move your legs often if you must stand in one place for a long time.  Avoid heavy lifting.  Wear low-heeled shoes and practice good posture.  You may continue to have sex unless your health care provider tells you not to. Relieving pain and discomfort  Wear a good support bra to relieve breast tenderness.  Take warm sitz baths to soothe any pain or discomfort caused by hemorrhoids. Use hemorrhoid cream if your health care provider approves.  Rest with your legs elevated if you have leg cramps or low back pain.  If you develop varicose veins in your legs, wear support hose. Elevate your feet for 15 minutes, 3-4 times a day. Limit salt in your diet. Prenatal care  Schedule your prenatal visits by the twelfth week of pregnancy. They are usually scheduled monthly at first, then more often in the last 2 months before delivery.  Write down your questions. Take them to your prenatal visits.  Keep all your prenatal visits as told by your health care provider. This is important. Safety  Wear your seat belt at all times when driving.  Make a list of emergency phone numbers, including numbers for family, friends, the hospital, and police and fire departments. General instructions  Ask your health care provider for a referral to a local prenatal education class. Begin classes no later than the beginning of month 6 of your pregnancy.  Ask for help if you have counseling or nutritional needs during pregnancy. Your health care provider can offer advice or refer you to specialists for help with various needs.  Do not use hot tubs, steam rooms, or saunas.  Do not douche or use tampons or scented sanitary pads.  Do not cross your legs for long periods of time.  Avoid cat litter boxes and soil used by cats. These carry germs that can cause birth defects in the baby and possibly  loss of the fetus by miscarriage or stillbirth.  Avoid all smoking, herbs, alcohol, and medicines not prescribed by your health care provider. Chemicals in these products affect the formation and growth of the baby.  Do not use any products that contain nicotine or tobacco, such as cigarettes and e-cigarettes. If you need help quitting, ask your health care provider. You may receive counseling support and other resources to help you quit.  Schedule a dentist appointment. At home, brush your  teeth with a soft toothbrush and be gentle when you floss. Contact a health care provider if:  You have dizziness.  You have mild pelvic cramps, pelvic pressure, or nagging pain in the abdominal area.  You have persistent nausea, vomiting, or diarrhea.  You have a bad smelling vaginal discharge.  You have pain when you urinate.  You notice increased swelling in your face, hands, legs, or ankles.  You are exposed to fifth disease or chickenpox.  You are exposed to [place] measles (rubella) and have never had it. Get help right away if:  You have a fever.  You are leaking fluid from your vagina.  You have spotting or bleeding from your vagina.  You have severe abdominal cramping or pain.  You have rapid weight gain or loss.  You vomit blood or material that looks like coffee grounds.  You develop a severe headache.  You have shortness of breath.  You have any kind of trauma, such as from a fall or a car accident. Summary  The first trimester of pregnancy is from week 1 until the end of week 13 (months 1 through 3).  Your body goes through many changes during pregnancy. The changes vary from woman to woman.  You will have routine prenatal visits. During those visits, your health care provider will examine you, discuss any test results you may have, and talk with you about how you are feeling. This information is not intended to replace advice given to you by your health care provider.  Make sure you discuss any questions you have with your health care provider. Document Revised: 04/07/2017 Document Reviewed: 04/06/2016 Elsevier Patient Education  2020 [employer].

## 2020-05-18 LAB — GC/CHLAMYDIA PROBE AMP (~~LOC~~) NOT AT ARMC
Chlamydia: NEGATIVE
Comment: NEGATIVE
Comment: NORMAL
Neisseria Gonorrhea: NEGATIVE

## 2020-05-18 LAB — URINE CULTURE: Culture: 80000 — AB

## 2020-05-19 ENCOUNTER — Other Ambulatory Visit (HOSPITAL_COMMUNITY)
Admission: RE | Admit: 2020-05-19 | Discharge: 2020-05-19 | Disposition: A | Discharge status: Home / Self Care | Payer: Medicaid Other | Source: Ambulatory Visit | Attending: Obstetrics & Gynecology | Admitting: Obstetrics & Gynecology

## 2020-05-19 ENCOUNTER — Ambulatory Visit (INDEPENDENT_AMBULATORY_CARE_PROVIDER_SITE_OTHER): Payer: Medicaid Other | Admitting: Obstetrics & Gynecology

## 2020-05-19 ENCOUNTER — Other Ambulatory Visit: Payer: Self-pay

## 2020-05-19 ENCOUNTER — Encounter: Payer: Self-pay | Admitting: Obstetrics & Gynecology

## 2020-05-19 VITALS — BP 127/76 | HR 101 | Wt 123.0 lb

## 2020-05-19 DIAGNOSIS — O0990 Supervision of high risk pregnancy, unspecified, unspecified trimester: Secondary | ICD-10-CM | POA: Insufficient documentation

## 2020-05-19 DIAGNOSIS — O09299 Supervision of pregnancy with other poor reproductive or obstetric history, unspecified trimester: Secondary | ICD-10-CM | POA: Diagnosis not present

## 2020-05-19 DIAGNOSIS — O2341 Unspecified infection of urinary tract in pregnancy, first trimester: Secondary | ICD-10-CM

## 2020-05-19 DIAGNOSIS — Z3480 Encounter for supervision of other normal pregnancy, unspecified trimester: Secondary | ICD-10-CM | POA: Insufficient documentation

## 2020-05-19 DIAGNOSIS — R768 Other specified abnormal immunological findings in serum: Secondary | ICD-10-CM

## 2020-05-19 DIAGNOSIS — Z8759 Personal history of other complications of pregnancy, childbirth and the puerperium: Secondary | ICD-10-CM | POA: Insufficient documentation

## 2020-05-19 DIAGNOSIS — O34219 Maternal care for unspecified type scar from previous cesarean delivery: Secondary | ICD-10-CM | POA: Insufficient documentation

## 2020-05-19 DIAGNOSIS — Z3400 Encounter for supervision of normal first pregnancy, unspecified trimester: Secondary | ICD-10-CM | POA: Diagnosis not present

## 2020-05-19 DIAGNOSIS — D563 Thalassemia minor: Secondary | ICD-10-CM

## 2020-05-19 DIAGNOSIS — Z3481 Encounter for supervision of other normal pregnancy, first trimester: Secondary | ICD-10-CM | POA: Diagnosis not present

## 2020-05-19 MED ORDER — BLOOD PRESSURE KIT DEVI: 1.0000 | 0 refills | Status: DC

## 2020-05-19 MED ORDER — ASPIRIN EC 81 MG PO TBEC: 81.0000 mg | DELAYED_RELEASE_TABLET | Freq: Every day | ORAL | 2 refills | Status: DC

## 2020-05-19 MED ORDER — CEFADROXIL 500 MG PO CAPS: 500.0000 mg | ORAL_CAPSULE | Freq: Two times a day (BID) | ORAL | 0 refills | Status: DC

## 2020-05-19 NOTE — Addendum Note (Signed)
Addended by: Scheryl Marten on: 05/19/2020 03:34 PM   Modules accepted: Orders

## 2020-05-19 NOTE — Progress Notes (Signed)
History:   Alexis Blanchard is a 26 y.o. [phone] at [redacted]w[redacted]d by LMP, early ultrasound being seen today for her first obstetrical visit.  Her obstetrical history is significant for having severe pre-eclampsia needing delivery at 34 weeks; cesarean section was done for breech presentation.. Patient does intend to breast feed. Pregnancy history fully reviewed.  Patient reports no complaints.      HISTORY: OB History  Gravida Para Term Preterm AB Living  3 1 0 1 1 1   SAB IAB Ectopic Multiple Live Births  1 0 0 0 1    # Outcome Date GA Lbr Len/2nd Weight Sex Delivery Anes PTL Lv  3 Current           2 Preterm 06/08/17 [redacted]w[redacted]d  4 lb 9.7 oz (2.09 kg) M CS-LTranv Spinal  LIV     Birth Comments: Severe preeclampsia, Breech      Complications: Severe preeclampsia, Breech presentation     Name: Kaley,BOY Ayleen     Apgar1: 9  Apgar5: 9  1 SAB            Past Medical History:  Diagnosis Date  . Severe preeclampsia    Delivered at 34 weeks   Past Surgical History:  Procedure Laterality Date  . CESAREAN SECTION N/A 06/08/2017   Procedure: CESAREAN SECTION;  Surgeon: 06/10/2017, MD;  Location: [facility] BIRTHING SUIT[facility];  Service: Obstetrics;  Laterality: N/A;  . TONSILLECTOMY     Family History  Problem Relation Age of Onset  . Hypertension Mother   . Heart disease Maternal Aunt        CHF  . Diabetes Maternal Grandmother   . Hypertension Maternal Grandmother   . Heart disease Maternal Grandmother   . Cancer Maternal Aunt 45       colon  . Cancer Maternal Aunt 45       throat  . Congestive Heart Failure Maternal Uncle    Social History   Tobacco Use  . Smoking status: Never Smoker  . Smokeless tobacco: Never Used  Vaping Use  . Vaping Use: Never used  Substance Use Topics  . Alcohol use: Not Currently    Comment: pt states that she has not used since finding out about the pregnancy  . Drug use: Yes    Types: Marijuana    Comment: pt states that she has not used  since finding out about the pregnancy   No Known Allergies Current Outpatient Medications on File Prior to Visit  Medication Sig Dispense Refill  . Prenat w/o A Vit-FeFum-FePo-FA (CONCEPT OB) 130-92.4-1 MG CAPS Take 1 tablet by mouth daily. 30 capsule 12  . metroNIDAZOLE (METROGEL VAGINAL) 0.75 % vaginal gel Place 1 Applicatorful vaginally at bedtime. Insert one applicator, at bedtime, for 5 nights. 70 g 0   No current facility-administered medications on file prior to visit.    Review of Systems Pertinent items noted in HPI and remainder of comprehensive ROS otherwise negative.  Physical Exam:   Vitals:   05/19/20 1444  BP: 127/76  Pulse: (!) 101  Weight: 123 lb (55.8 kg)   Bedside Ultrasound for FHR check: Viable intrauterine pregnancy with positive cardiac activity noted, Fetal Heart Rate (bpm): 170 Patient informed that the ultrasound is considered a limited obstetric ultrasound and is not intended to be a complete ultrasound exam.  Patient also informed that the ultrasound is not being completed with the intent of assessing for fetal or placental anomalies or any pelvic abnormalities.  Explained that  the purpose of today's ultrasound is to assess for fetal heart rate.  Patient acknowledges the purpose of the exam and the limitations of the study. General: well-developed, well-nourished female in no acute distress  Breasts:  normal appearance, no masses or tenderness bilaterally  Skin: normal coloration and turgor, no rashes  Neurologic: oriented, normal, negative, normal mood  Extremities: normal strength, tone, and muscle mass, ROM of all joints is normal  HEENT PERRLA, extraocular movement intact and sclera clear, anicteric  Neck supple and no masses  Cardiovascular: regular rate and rhythm  Respiratory:  no respiratory distress, normal breath sounds  Abdomen: soft, non-tender; bowel sounds normal; no masses,  no organomegaly  Pelvic: normal external genitalia, no lesions,  normal vaginal mucosa, normal vaginal discharge, normal cervix, pap smear done with mild bleeding.     Assessment:    Pregnancy: [id number] Patient Active Problem List   Diagnosis Date Noted  . History of severe preeclampsia in prior pregnancy needing delivery at 34 weeks 05/19/2020  . Previous cesarean delivery for breech at 34 weeks for severe preeclampsia, antepartum 05/19/2020  . Supervision of other normal pregnancy, antepartum 05/19/2020  . UTI in pregnancy, first trimester 05/19/2020  . Alpha thalassemia silent carrier 01/31/2017     Plan:    1. History of severe preeclampsia in prior pregnancy needing delivery at 34 weeks BP cuff to be provided for her. Aspirin prescribed for prophylaxis for PEC. Baseline labs to be checked. Will monitor BP weekly, Babyscripts recommended. - Comprehensive metabolic panel - Protein / creatinine ratio, urine - aspirin EC 81 MG tablet; Take 1 tablet (81 mg total) by mouth daily. Take after 12 weeks for prevention of preeclampsia later in pregnancy  Dispense: 300 tablet; Refill: 2  2. Previous cesarean delivery for breech at 34 weeks for severe preeclampsia, antepartum Unsure of modality of delivery this time, will discuss later this pregnancy.  3. UTI in pregnancy, first trimester Being treated for E.coli UTI now, will do TOC next visit.  4. Alpha thalassemia silent carrier FOB is getting Hemoglobin evaluation today, will follow up results and manage accordingly.  5. Supervision of other normal pregnancy, antepartum - CBC/D/Plt+RPR+Rh+ABO+Rub Ab... - Genetic Screening - US MFM OB COMP + 14 WK; Future - Enroll Patient in Babyscripts - Cytology - PAP( [place]) - Babyscripts Schedule Optimization Initial labs drawn. Continue prenatal vitamins. Problem list reviewed and updated. Genetic Screening discussed, NIPS: ordered. Ultrasound discussed; fetal anatomic survey: ordered. Anticipatory guidance about prenatal visits given including  labs, ultrasounds, and testing. Discussed usage of Babyscripts and virtual visits as additional source of managing and completing prenatal visits in midst of coronavirus and pandemic.   Encouraged to complete MyChart Registration for her ability to review results, send requests, and have questions addressed.  The nature of [place] [facility] with multiple MDs and Advanced Practice Providers was explained to patient; also emphasized that residents, students are part of our team. Routine obstetric precautions reviewed. Encouraged to seek out care at office or emergency room [facility] preferred) for urgent and/or emergent concerns. Return in about 4 weeks (around 06/16/2020) for OFFICE OB VISIT (MD or APP), can be virtual.     Jaynie Collins, MD, FACOG Obstetrician & Gynecologist, [facility] for [employer], [facility]

## 2020-05-19 NOTE — Patient Instructions (Signed)
First Trimester of Pregnancy  The first trimester of pregnancy starts on the first day of your last menstrual period until the end of week 12. This is months 1 through 3 of pregnancy. A week after a sperm fertilizes an egg, the egg will implant into the wall of the uterus and begin to develop into a baby. By the end of 12 weeks, all the baby's organs will be formed and the baby will be 2-3 inches in size. Body changes during your first trimester Your body goes through many changes during pregnancy. The changes vary and generally return to normal after your baby is born. Physical changes  You may gain or lose weight.  Your breasts may begin to grow larger and become tender. The tissue that surrounds your nipples (areola) may become darker.  Dark spots or blotches (chloasma or mask of pregnancy) may develop on your face.  You may have changes in your hair. These can include thickening or thinning of your hair or changes in texture. Health changes  You may feel nauseous, and you may vomit.  You may have heartburn.  You may develop headaches.  You may develop constipation.  Your gums may bleed and may be sensitive to brushing and flossing. Other changes  You may tire easily.  You may urinate more often.  Your menstrual periods will stop.  You may have a loss of appetite.  You may develop cravings for certain kinds of food.  You may have changes in your emotions from day to day.  You may have more vivid and strange dreams. Follow these instructions at home: Medicines  Follow your health care provider's instructions regarding medicine use. Specific medicines may be either safe or unsafe to take during pregnancy. Do not take any medicines unless told to by your health care provider.  Take a prenatal vitamin that contains at least 600 micrograms (mcg) of folic acid. Eating and drinking  Eat a healthy diet that includes fresh fruits and vegetables, whole grains, good sources of  protein such as meat, eggs, or tofu, and low-fat dairy products.  Avoid raw meat and unpasteurized juice, milk, and cheese. These carry germs that can harm you and your baby.  If you feel nauseous or you vomit: ? Eat 4 or 5 small meals a day instead of 3 large meals. ? Try eating a few soda crackers. ? Drink liquids between meals instead of during meals.  You may need to take these actions to prevent or treat constipation: ? Drink enough fluid to keep your urine pale yellow. ? Eat foods that are high in fiber, such as beans, whole grains, and fresh fruits and vegetables. ? Limit foods that are high in fat and processed sugars, such as fried or sweet foods. Activity  Exercise only as directed by your health care provider. Most people can continue their usual exercise routine during pregnancy. Try to exercise for 30 minutes at least 5 days a week.  Stop exercising if you develop pain or cramping in the lower abdomen or lower back.  Avoid exercising if it is very hot or humid or if you are at high altitude.  Avoid heavy lifting.  If you choose to, you may have sex unless your health care provider tells you not to. Relieving pain and discomfort  Wear a good support bra to relieve breast tenderness.  Rest with your legs elevated if you have leg cramps or low back pain.  If you develop bulging veins (varicose veins) in   your legs: ? Wear support hose as told by your health care provider. ? Elevate your feet for 15 minutes, 3-4 times a day. ? Limit salt in your diet. Safety  Wear your seat belt at all times when driving or riding in a car.  Talk with your health care provider if someone is verbally or physically abusive to you.  Talk with your health care provider if you are feeling sad or have thoughts of hurting yourself. Lifestyle  Do not use hot tubs, steam rooms, or saunas.  Do not douche. Do not use tampons or scented sanitary pads.  Do not use herbal remedies, alcohol,  illegal drugs, or medicines that are not approved by your health care provider. Chemicals in these products can harm your baby.  Do not use any products that contain nicotine or tobacco, such as cigarettes, e-cigarettes, and chewing tobacco. If you need help quitting, ask your health care provider.  Avoid cat litter boxes and soil used by cats. These carry germs that can cause birth defects in the baby and possibly loss of the unborn baby (fetus) by miscarriage or stillbirth. General instructions  During routine prenatal visits in the first trimester, your health care provider will do a physical exam, perform necessary tests, and ask you how things are going. Keep all follow-up visits. This is important.  Ask for help if you have counseling or nutritional needs during pregnancy. Your health care provider can offer advice or refer you to specialists for help with various needs.  Schedule a dentist appointment. At home, brush your teeth with a soft toothbrush. Floss gently.  Write down your questions. Take them to your prenatal visits. Where to find more information  American Pregnancy Association: americanpregnancy.org  American College of Obstetricians and Gynecologists: acog.org/en/Womens%20Health/Pregnancy  Office on Women's Health: womenshealth.gov/pregnancy Contact a health care provider if you have:  Dizziness.  A fever.  Mild pelvic cramps, pelvic pressure, or nagging pain in the abdominal area.  Nausea, vomiting, or diarrhea that lasts for 24 hours or longer.  A bad-smelling vaginal discharge.  Pain when you urinate.  Known exposure to a contagious illness, such as chickenpox, measles, Zika virus, HIV, or hepatitis. Get help right away if you have:  Spotting or bleeding from your vagina.  Severe abdominal cramping or pain.  Shortness of breath or chest pain.  Any kind of trauma, such as from a fall or a car crash.  New or increased pain, swelling, or redness in an  arm or leg. Summary  The first trimester of pregnancy starts on the first day of your last menstrual period until the end of week 12 (months 1 through 3).  Eating 4 or 5 small meals a day rather than 3 large meals may help to relieve nausea and vomiting.  Do not use any products that contain nicotine or tobacco, such as cigarettes, e-cigarettes, and chewing tobacco. If you need help quitting, ask your health care provider.  Keep all follow-up visits. This is important. This information is not intended to replace advice given to you by your health care provider. Make sure you discuss any questions you have with your health care provider. Document Revised: 10/02/2019 Document Reviewed: 08/08/2019 Elsevier Patient Education  2021 Elsevier Inc.  

## 2020-05-20 DIAGNOSIS — R87610 Atypical squamous cells of undetermined significance on cytologic smear of cervix (ASC-US): Secondary | ICD-10-CM | POA: Diagnosis not present

## 2020-05-20 DIAGNOSIS — R768 Other specified abnormal immunological findings in serum: Secondary | ICD-10-CM | POA: Insufficient documentation

## 2020-05-20 HISTORY — DX: Other specified abnormal immunological findings in serum: R76.8

## 2020-05-20 LAB — CBC/D/PLT+RPR+RH+ABO+RUB AB...
Antibody Screen: NEGATIVE
Basophils Absolute: 0 10*3/uL (ref 0.0–0.2)
Basos: 1 %
EOS (ABSOLUTE): 0.2 10*3/uL (ref 0.0–0.4)
Eos: 2 %
HCV Ab: <0.1 {s_co_ratio} (ref 0.0–0.9)
HIV Screen 4th Generation wRfx: NONREACTIVE
Hematocrit: 38.6 % (ref 34.0–46.6)
Hemoglobin: 13.1 g/dL (ref 11.1–15.9)
Hepatitis B Surface Ag: NEGATIVE
Immature Grans (Abs): 0 10*3/uL (ref 0.0–0.1)
Immature Granulocytes: 0 %
Lymphocytes Absolute: 2.1 10*3/uL (ref 0.7–3.1)
Lymphs: 26 %
MCH: 27.1 pg (ref 26.6–33.0)
MCHC: 33.9 g/dL (ref 31.5–35.7)
MCV: 80 fL (ref 79–97)
Monocytes Absolute: 0.6 10*3/uL (ref 0.1–0.9)
Monocytes: 8 %
Neutrophils Absolute: 5.1 10*3/uL (ref 1.4–7.0)
Neutrophils: 63 %
Platelets: 342 10*3/uL (ref 150–450)
RBC: 4.84 x10E6/uL (ref 3.77–5.28)
RDW: 13.1 % (ref 11.7–15.4)
RPR Ser Ql: REACTIVE — AB
Rh Factor: POSITIVE
Rubella Antibodies, IGG: 4.71 {index}
WBC: 8 10*3/uL (ref 3.4–10.8)

## 2020-05-20 LAB — COMPREHENSIVE METABOLIC PANEL WITH GFR
ALT: 11 [IU]/L (ref 0–32)
AST: 12 [IU]/L (ref 0–40)
Albumin/Globulin Ratio: 2 (ref 1.2–2.2)
Albumin: 4.4 g/dL (ref 3.9–5.0)
Alkaline Phosphatase: 66 [IU]/L (ref 44–121)
BUN/Creatinine Ratio: 14 (ref 9–23)
BUN: 10 mg/dL (ref 6–20)
Bilirubin Total: <0.2 mg/dL (ref 0.0–1.2)
CO2: 24 mmol/L (ref 20–29)
Calcium: 9.5 mg/dL (ref 8.7–10.2)
Chloride: 102 mmol/L (ref 96–106)
Creatinine, Ser: 0.73 mg/dL (ref 0.57–1.00)
GFR calc Af Amer: 134 mL/min/{1.73_m2}
GFR calc non Af Amer: 116 mL/min/{1.73_m2}
Globulin, Total: 2.2 g/dL (ref 1.5–4.5)
Glucose: 78 mg/dL (ref 65–99)
Potassium: 4 mmol/L (ref 3.5–5.2)
Sodium: 138 mmol/L (ref 134–144)
Total Protein: 6.6 g/dL (ref 6.0–8.5)

## 2020-05-20 LAB — HCV INTERPRETATION

## 2020-05-20 LAB — PROTEIN / CREATININE RATIO, URINE
Creatinine, Urine: 125.1 mg/dL
Protein, Ur: 33.6 mg/dL
Protein/Creat Ratio: 269 mg/g{creat} — ABNORMAL HIGH (ref 0–200)

## 2020-05-20 LAB — RPR, QUANT+TP ABS (REFLEX)
Rapid Plasma Reagin, Quant: 1:1 {titer} — ABNORMAL HIGH
T Pallidum Abs: NONREACTIVE

## 2020-05-26 ENCOUNTER — Other Ambulatory Visit: Payer: Self-pay

## 2020-05-26 ENCOUNTER — Telehealth: Payer: Self-pay | Admitting: Radiology

## 2020-05-26 ENCOUNTER — Ambulatory Visit (HOSPITAL_COMMUNITY)
Admission: EM | Admit: 2020-05-26 | Discharge: 2020-05-26 | Disposition: A | Discharge status: Home / Self Care | Payer: Medicaid Other | Attending: Family Medicine | Admitting: Family Medicine

## 2020-05-26 ENCOUNTER — Encounter: Payer: Self-pay | Admitting: Radiology

## 2020-05-26 ENCOUNTER — Encounter (HOSPITAL_COMMUNITY): Payer: Self-pay | Admitting: *Deleted

## 2020-05-26 ENCOUNTER — Other Ambulatory Visit: Payer: Self-pay | Admitting: Family Medicine

## 2020-05-26 DIAGNOSIS — K219 Gastro-esophageal reflux disease without esophagitis: Secondary | ICD-10-CM

## 2020-05-26 DIAGNOSIS — R112 Nausea with vomiting, unspecified: Secondary | ICD-10-CM | POA: Diagnosis not present

## 2020-05-26 DIAGNOSIS — Z3491 Encounter for supervision of normal pregnancy, unspecified, first trimester: Secondary | ICD-10-CM | POA: Diagnosis not present

## 2020-05-26 MED ORDER — DOXYLAMINE-PYRIDOXINE 10-10 MG PO TBEC: 2.0000 | DELAYED_RELEASE_TABLET | Freq: Every day | ORAL | 0 refills | Status: DC

## 2020-05-26 MED ORDER — FAMOTIDINE 20 MG PO TABS: 20.0000 mg | ORAL_TABLET | Freq: Two times a day (BID) | ORAL | 0 refills | Status: DC

## 2020-05-26 NOTE — Telephone Encounter (Signed)
Patient was informed of Panorama results including fetal sex 

## 2020-05-26 NOTE — ED Provider Notes (Signed)
[facility]    CSN: [id number] Arrival date & time: 05/26/20  0847      History   Chief Complaint Chief Complaint  Patient presents with  . Emesis    HPI Alexis Blanchard is a 25 y.o. female.   Here today with about 3 weeks of N/V and acid reflux irritation/throat irritation that she states started around week 8 of her pregnancy. Sxs worsening the past few days and now unable to keep anything down. Denies fever, chills, dizziness, syncope, CP, SOB. Not trying anything OTC for sxs. Has had initial OB visit with normal ultrasound findings and reassuring exam last week. Of note, treating a UTI with abx currently through OBGYN but no other medication changes noted. No new sick contacts.      Past Medical History:  Diagnosis Date  . Severe preeclampsia    Delivered at 34 weeks    Patient Active Problem List   Diagnosis Date Noted  . 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 History of severe preeclampsia in prior pregnancy needing delivery at 34 weeks 05/19/2020  . Previous cesarean delivery for breech at 34 weeks for severe preeclampsia, antepartum 05/19/2020  . Supervision of other normal pregnancy, antepartum 05/19/2020  . UTI in pregnancy, first trimester 05/19/2020  . Alpha thalassemia silent carrier 01/31/2017    Past Surgical History:  Procedure Laterality Date  . CESAREAN SECTION N/A 06/08/2017   Procedure: CESAREAN SECTION;  Surgeon: Lavonia Drafts, MD;  Location: [facility];  Service: Obstetrics;  Laterality: N/A;  . TONSILLECTOMY      OB History    Gravida  3   Para  1   Term  0   Preterm  1   AB  1   Living  1     SAB  1   IAB  0   Ectopic  0   Multiple  0   Live Births  1            Home Medications    Prior to Admission medications   Medication Sig Start Date End Date Taking? Authorizing Provider  aspirin EC 81 MG tablet Take 1 tablet (81 mg total) by mouth daily. Take after 12 weeks for prevention of  preeclampsia later in pregnancy 05/19/20  Yes Anyanwu, Sallyanne Havers, MD  Blood Pressure Monitoring (BLOOD PRESSURE KIT) DEVI 1 Device by Does not apply route once a week. To be monitored weekly from home 05/19/20  Yes Anyanwu, Sallyanne Havers, MD  cefadroxil (DURICEF) 500 MG capsule Take 1 capsule (500 mg total) by mouth 2 (two) times daily. 05/19/20  Yes Gavin Pound, CNM  Doxylamine-Pyridoxine (DICLEGIS) 10-10 MG TBEC Take 2 tablets by mouth at bedtime. 05/26/20  Yes Volney American, PA-C  famotidine (PEPCID) 20 MG tablet Take 1 tablet (20 mg total) by mouth 2 (two) times daily. 05/26/20  Yes Volney American, PA-C  Prenat w/o A Vit-FeFum-FePo-FA (CONCEPT OB) 130-92.4-1 MG CAPS Take 1 tablet by mouth daily. 01/16/17  Yes Donnamae Jude, MD  metroNIDAZOLE (METROGEL VAGINAL) 0.75 % vaginal gel Place 1 Applicatorful vaginally at bedtime. Insert one applicator, at bedtime, for 5 nights. 05/16/20   Gavin Pound, CNM    Family History Family History  Problem Relation Age of Onset  . Hypertension Mother   . Heart disease Maternal Aunt        CHF  . Diabetes Maternal Grandmother   . Hypertension Maternal Grandmother   . Heart disease Maternal Grandmother   .  Cancer Maternal Aunt 45       colon  . Cancer Maternal Aunt 45       throat  . Congestive Heart Failure Maternal Uncle     Social History Social History   Tobacco Use  . Smoking status: Never Smoker  . Smokeless tobacco: Never Used  Vaping Use  . Vaping Use: Never used  Substance Use Topics  . Alcohol use: Not Currently    Comment: pt states that she has not used since finding out about the pregnancy  . Drug use: Yes    Types: Marijuana    Comment: pt states that she has not used since finding out about the pregnancy     Allergies   Patient has no known allergies.   Review of Systems Review of Systems PER HPI    Physical Exam Triage Vital Signs ED Triage Vitals  Enc Vitals Group     BP 05/26/20 0858 125/79     Pulse  Rate 05/26/20 0858 93     Resp 05/26/20 0858 18     Temp 05/26/20 0858 99 F (37.2 C)     Temp Source 05/26/20 0858 Oral     SpO2 05/26/20 0858 100 %     Weight --      Height --      Head Circumference --      Peak Flow --      Pain Score 05/26/20 0900 1     Pain Loc --      Pain Edu? --      Excl. in GC? --    No data found.  Updated Vital Signs BP 125/79 (BP Location: Left Arm)   Pulse 93   Temp 99 F (37.2 C) (Oral)   Resp 18   LMP 03/09/2020   SpO2 100%   Visual Acuity Right Eye Distance:   Left Eye Distance:   Bilateral Distance:    Right Eye Near:   Left Eye Near:    Bilateral Near:     Physical Exam Vitals and nursing note reviewed.  Constitutional:      Appearance: Normal appearance. She is not ill-appearing.  HENT:     Head: Atraumatic.     Mouth/Throat:     Mouth: Mucous membranes are moist.     Pharynx: Oropharynx is clear. No oropharyngeal exudate or posterior oropharyngeal erythema.  Eyes:     Extraocular Movements: Extraocular movements intact.     Conjunctiva/sclera: Conjunctivae normal.  Cardiovascular:     Rate and Rhythm: Normal rate and regular rhythm.     Heart sounds: Normal heart sounds.  Pulmonary:     Effort: Pulmonary effort is normal.     Breath sounds: Normal breath sounds.  Abdominal:     General: Bowel sounds are normal. There is no distension.     Tenderness: There is no abdominal tenderness. There is no right CVA tenderness, left CVA tenderness or guarding.  Musculoskeletal:        General: Normal range of motion.     Cervical back: Normal range of motion and neck supple.  Skin:    General: Skin is warm and dry.  Neurological:     Mental Status: She is alert and oriented to person, place, and time.  Psychiatric:        Mood and Affect: Mood normal.        Thought Content: Thought content normal.        Judgment: Judgment normal.        UC Treatments / Results  Labs (all labs ordered are listed, but only abnormal  results are displayed) Labs Reviewed - No data to display  EKG   Radiology No results found.  Procedures Procedures (including critical care time)  Medications Ordered in UC Medications - No data to display  Initial Impression / Assessment and Plan / UC Course  I have reviewed the triage vital signs and the nursing notes.  Pertinent labs & imaging results that were available during my care of the patient were reviewed by me and considered in my medical decision making (see chart for details).     Exam and vitals very reassuring today, discussed diclegis and prn pepcid for symptomatic relief. Possibly sxs worsened due to abx side effects, hoping completion of this plus these medications will lessen sxs. BRAT diet, fluids encouraged. Close OBGYN f/u recommended for re-check and return precautions for acutely worsening sxs reviewed.   Final Clinical Impressions(s) / UC Diagnoses   Final diagnoses:  Intractable vomiting with nausea, unspecified vomiting type  First trimester pregnancy  Gastroesophageal reflux disease, unspecified whether esophagitis present     Discharge Instructions     Follow up in the next week with your OBGYN for re-check of your symptoms and further recommendations    ED Prescriptions    Medication Sig Dispense Auth. Provider   Doxylamine-Pyridoxine (DICLEGIS) 10-10 MG TBEC Take 2 tablets by mouth at bedtime. 60 tablet Volney American, [place]   famotidine (PEPCID) 20 MG tablet Take 1 tablet (20 mg total) by mouth 2 (two) times daily. 60 tablet Volney American, [place]     PDMP not reviewed this encounter.   Volney American, [place] 05/26/20 1111

## 2020-05-26 NOTE — Discharge Instructions (Signed)
Follow up in the next week with your OBGYN for re-check of your symptoms and further recommendations

## 2020-05-26 NOTE — ED Triage Notes (Signed)
Pt reports she id [redacted] weeks pregnant and has been vomiting a lot. Pt reports she has vomited 5 times in past 24 hrs. Pt has seen blood streaked mucous when she vomits.

## 2020-05-27 LAB — CYTOLOGY - PAP
Comment: NEGATIVE
Diagnosis: UNDETERMINED — AB
High risk HPV: POSITIVE — AB

## 2020-05-28 ENCOUNTER — Encounter: Payer: Self-pay | Admitting: Obstetrics & Gynecology

## 2020-05-28 DIAGNOSIS — R87610 Atypical squamous cells of undetermined significance on cytologic smear of cervix (ASC-US): Secondary | ICD-10-CM | POA: Insufficient documentation

## 2020-06-02 DIAGNOSIS — Z20828 Contact with and (suspected) exposure to other viral communicable diseases: Secondary | ICD-10-CM | POA: Diagnosis not present

## 2020-06-03 ENCOUNTER — Inpatient Hospital Stay (HOSPITAL_COMMUNITY)
Admission: AD | Admit: 2020-06-03 | Discharge: 2020-06-04 | Disposition: A | Discharge status: Home / Self Care | Payer: Medicaid Other | Attending: Obstetrics & Gynecology | Admitting: Obstetrics & Gynecology

## 2020-06-03 ENCOUNTER — Other Ambulatory Visit: Payer: Self-pay

## 2020-06-03 ENCOUNTER — Encounter (HOSPITAL_COMMUNITY): Payer: Self-pay | Admitting: Obstetrics & Gynecology

## 2020-06-03 DIAGNOSIS — J029 Acute pharyngitis, unspecified: Secondary | ICD-10-CM | POA: Insufficient documentation

## 2020-06-03 DIAGNOSIS — Z3480 Encounter for supervision of other normal pregnancy, unspecified trimester: Secondary | ICD-10-CM

## 2020-06-03 DIAGNOSIS — Z7982 Long term (current) use of aspirin: Secondary | ICD-10-CM | POA: Insufficient documentation

## 2020-06-03 DIAGNOSIS — O98511 Other viral diseases complicating pregnancy, first trimester: Secondary | ICD-10-CM

## 2020-06-03 DIAGNOSIS — R87610 Atypical squamous cells of undetermined significance on cytologic smear of cervix (ASC-US): Secondary | ICD-10-CM

## 2020-06-03 DIAGNOSIS — R768 Other specified abnormal immunological findings in serum: Secondary | ICD-10-CM

## 2020-06-03 DIAGNOSIS — O99611 Diseases of the digestive system complicating pregnancy, first trimester: Secondary | ICD-10-CM

## 2020-06-03 DIAGNOSIS — O09299 Supervision of pregnancy with other poor reproductive or obstetric history, unspecified trimester: Secondary | ICD-10-CM

## 2020-06-03 DIAGNOSIS — O2341 Unspecified infection of urinary tract in pregnancy, first trimester: Secondary | ICD-10-CM

## 2020-06-03 DIAGNOSIS — O99511 Diseases of the respiratory system complicating pregnancy, first trimester: Secondary | ICD-10-CM | POA: Diagnosis not present

## 2020-06-03 DIAGNOSIS — R058 Other specified cough: Secondary | ICD-10-CM

## 2020-06-03 DIAGNOSIS — Z3A12 12 weeks gestation of pregnancy: Secondary | ICD-10-CM

## 2020-06-03 DIAGNOSIS — R87810 Cervical high risk human papillomavirus (HPV) DNA test positive: Secondary | ICD-10-CM

## 2020-06-03 DIAGNOSIS — Z79899 Other long term (current) drug therapy: Secondary | ICD-10-CM | POA: Insufficient documentation

## 2020-06-03 DIAGNOSIS — U071 COVID-19: Secondary | ICD-10-CM

## 2020-06-03 DIAGNOSIS — Z20822 Contact with and (suspected) exposure to covid-19: Secondary | ICD-10-CM

## 2020-06-03 DIAGNOSIS — O34219 Maternal care for unspecified type scar from previous cesarean delivery: Secondary | ICD-10-CM

## 2020-06-03 DIAGNOSIS — K219 Gastro-esophageal reflux disease without esophagitis: Secondary | ICD-10-CM | POA: Diagnosis not present

## 2020-06-03 DIAGNOSIS — R7689 Other specified abnormal immunological findings in serum: Secondary | ICD-10-CM

## 2020-06-03 DIAGNOSIS — K2100 Gastro-esophageal reflux disease with esophagitis, without bleeding: Secondary | ICD-10-CM

## 2020-06-03 MED ORDER — ALUM & MAG HYDROXIDE-SIMETH 200-200-20 MG/5ML PO SUSP
30.0000 mL | Freq: Once | ORAL | Status: AC
  Administered 2020-06-04: 30 mL via ORAL
  Filled 2020-06-03: qty 30

## 2020-06-03 MED ORDER — LIDOCAINE VISCOUS HCL 2 % MT SOLN
15.0000 mL | Freq: Once | OROMUCOSAL | Status: AC
  Administered 2020-06-04: 15 mL via ORAL
  Filled 2020-06-03: qty 15

## 2020-06-03 NOTE — MAU Note (Signed)
I got a positive covid test tonight by email for my job. I called up here and told them I had a positive covid test. When I told them I was having chest pain I was told to come in. Also having coughing.I have had chest pain on and off for couple days but thought it was heartburn as that is what it feels like. NO fever or chills. Have had a cough for a wk. Denies any pregnancy concerns.

## 2020-06-03 NOTE — MAU Provider Note (Signed)
Chief Complaint: Sore Throat and Chest Pain   Event Date/Time   First Provider Initiated Contact with Patient 06/03/20 2350        SUBJECTIVE HPI: Alexis Blanchard is a 25 y.o. [id number] at 2w2dby LMP who presents to maternity admissions reporting positive Covid test at work.  Had some cough for the past week.  Has had some acid reflux, and reported she had some chest burning, just like what she has with acid reflux.   States that discomfort is gone now. Never had any substernal pain or pressure, or shortness of breath.  Usually takes Pepcid for heartburn.  Denies fever or chills.  Talking on phone.  She denies vaginal bleeding, vaginal itching/burning, urinary symptoms, h/a, dizziness, n/v, or fever/chills.    Sore Throat  This is a recurrent problem. The current episode started in the past 7 days. The problem has been unchanged. Neither side of throat is experiencing more pain than the other. There has been no fever. The patient is experiencing no pain. Associated symptoms include coughing. Pertinent negatives include no abdominal pain, congestion, diarrhea, ear pain, headaches, hoarse voice, shortness of breath, swollen glands, trouble swallowing or vomiting. She has tried nothing for the symptoms.  Chest Pain  This is a recurrent (consistent with preexisting acid reflux) problem. The current episode started 1 to 4 weeks ago. The onset quality is gradual. The problem occurs intermittently. The problem has been resolved. The pain is present in the epigastric region. The pain is at a severity of 0/10. The patient is experiencing no pain. The pain does not radiate. Associated symptoms include a cough. Pertinent negatives include no abdominal pain, diaphoresis, dizziness, exertional chest pressure, fever, headaches, hemoptysis, irregular heartbeat, nausea, palpitations, shortness of breath, syncope, vomiting or weakness. The cough is non-productive. Associated with: acid reflux. She has tried antacids for  the symptoms. The treatment provided moderate relief. Risk factors: Covid.   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 I called up here and told them I had a positive covid test. When I told them I was having chest pain I was told to come in. Also having coughing.I have had chest pain on and off for couple days but thought it was heartburn as that is what it feels like. NO fever or chills. Have had a cough for a wk. Denies any pregnancy concerns.   Past Medical History:  Diagnosis Date  . Severe preeclampsia    Delivered at 34 weeks   Past Surgical History:  Procedure Laterality Date  . CESAREAN SECTION N/A 06/08/2017   Procedure: CESAREAN SECTION;  Surgeon: HLavonia Drafts MD;  [place] [facility]  Service: Obstetrics;  Laterality: N/A;  . TONSILLECTOMY     Social History   Socioeconomic History  . Marital status: Single    Spouse name: Not on file  . Number of children: Not on file  . Years of education: Not on file  . Highest education level: Not on file  Occupational History  . Not on file  Tobacco Use  . Smoking status: Never Smoker  . Smokeless tobacco: Never Used  Vaping Use  . Vaping Use: Never used  Substance and Sexual Activity  . Alcohol use: Not Currently    Comment: pt states that she has not used since finding out about the pregnancy  . Drug use: Yes    Types: Marijuana    Comment: pt states that she has not used since finding out  about the pregnancy  . Sexual activity: Yes    Birth control/protection: None  Other Topics Concern  . Not on file  Social History Narrative  . Not on file   Social Determinants of Health   Financial Resource Strain: Not on file  Food Insecurity: Not on file  Transportation Needs: Not on file  Physical Activity: Not on file  Stress: Not on file  Social Connections: Not on file  Intimate Partner Violence: Not on file   No current facility-administered medications on file prior to  encounter.   Current Outpatient Medications on File Prior to Encounter  Medication Sig Dispense Refill  . Doxylamine-Pyridoxine (DICLEGIS) 10-10 MG TBEC Take 2 tablets by mouth at bedtime. 60 tablet 0  . famotidine (PEPCID) 20 MG tablet Take 1 tablet (20 mg total) by mouth 2 (two) times daily. 60 tablet 0  . Prenat w/o A Vit-FeFum-FePo-FA (CONCEPT OB) 130-92.4-1 MG CAPS Take 1 tablet by mouth daily. 30 capsule 12  . aspirin EC 81 MG tablet Take 1 tablet (81 mg total) by mouth daily. Take after 12 weeks for prevention of preeclampsia later in pregnancy 300 tablet 2  . Blood Pressure Monitoring (BLOOD PRESSURE KIT) DEVI 1 Device by Does not apply route once a week. To be monitored weekly from home 1 each 0  . cefadroxil (DURICEF) 500 MG capsule Take 1 capsule (500 mg total) by mouth 2 (two) times daily. 14 capsule 0  . metroNIDAZOLE (METROGEL VAGINAL) 0.75 % vaginal gel Place 1 Applicatorful vaginally at bedtime. Insert one applicator, at bedtime, for 5 nights. 70 g 0   No Known Allergies  I have reviewed patient's Past Medical Hx, Surgical Hx, Family Hx, Social Hx, medications and allergies.   ROS:  Review of Systems  Constitutional: Negative for diaphoresis and fever.  HENT: Negative for congestion, ear pain, hoarse voice and trouble swallowing.   Respiratory: Positive for cough. Negative for hemoptysis and shortness of breath.   Cardiovascular: Positive for chest pain. Negative for palpitations and syncope.  Gastrointestinal: Negative for abdominal pain, diarrhea, nausea and vomiting.  Neurological: Negative for dizziness, weakness and headaches.   Review of Systems  Other systems negative   Physical Exam  Physical Exam Patient Vitals for the past 24 hrs:  BP Temp Pulse Resp SpO2  06/03/20 2317 132/81 98.2 F (36.8 C) 92 16 100 %   Constitutional: Well-developed, well-nourished female in no acute distress.  Cardiovascular: normal rate, normal rhythm.  Respiratory: normal  effort, lungs clear to auscultation bilaterally, no wheezing or crackles  Oxygen saturation 99-100% GI: Abd soft, non-tender.  MS: Extremities nontender, no edema, normal ROM Neurologic: Alert and oriented x 4.  GU: Neg CVAT.  PELVIC EXAM: deferred  LAB RESULTS No results found for this or any previous visit (from the past 24 hour(s)).   B/Positive/-- (01/11 1516)  IMAGING Bedside US done since we could not auscultate with doppler Pt informed that the ultrasound is considered a limited OB ultrasound and is not intended to be a complete ultrasound exam.  Patient also informed that the ultrasound is not being completed with the intent of assessing for fetal or placental anomalies or any pelvic abnormalities.  Explained that the purpose of today's ultrasound is to assess for FHR.    Patient acknowledges the purpose of the exam and the limitations of the study.    Single active fetus, c/w GA visible FHR 150s Fetus moving arms and legs.   MAU Management/MDM: Chest discomfort was similar to  preexisting acid reflux and is now resolved We gave her a GI cocktail  Chest clear, no pain in chest.  No active coughing noted Discussed signs to return for, including shortness of breath or high fever May continue Pepcid and Diclegis (has Rxes)  ASSESSMENT Single IUP at 62w3dPositive for Covid Cough due to Covid Epigastric burning, now resolved, related to acid reflux  PLAN Discharge home Usual covid care, supportive Rx Mucinex sent for cough Precautions to return for reviewed  Pt stable at time of discharge. Encouraged to return here if she develops worsening of symptoms, increase in pain, fever, or other concerning symptoms.    MHansel FeinsteinCNM, MSN Certified Nurse-Midwife 06/03/2020  11:50 PM

## 2020-06-04 MED ORDER — GUAIFENESIN ER 600 MG PO TB12: 600.0000 mg | ORAL_TABLET | Freq: Two times a day (BID) | ORAL | 0 refills | Status: DC

## 2020-06-04 NOTE — Discharge Instructions (Signed)
10 Things You Can Do to Manage Your COVID-19 Symptoms at Home If you have possible or confirmed COVID-19: 1. Stay home except to get medical care. 2. Monitor your symptoms carefully. If your symptoms get worse, call your healthcare provider immediately. 3. Get rest and stay hydrated. 4. If you have a medical appointment, call the healthcare provider ahead of time and tell them that you have or may have COVID-19. 5. For medical emergencies, call 911 and notify the dispatch personnel that you have or may have COVID-19. 6. Cover your cough and sneezes with a tissue or use the inside of your elbow. 7. Wash your hands often with soap and water for at least 20 seconds or clean your hands with an alcohol-based hand sanitizer that contains at least 60% alcohol. 8. As much as possible, stay in a specific room and away from other people in your home. Also, you should use a separate bathroom, if available. If you need to be around other people in or outside of the home, wear a mask. 9. Avoid sharing personal items with other people in your household, like dishes, towels, and bedding. 10. Clean all surfaces that are touched often, like counters, tabletops, and doorknobs. Use household cleaning sprays or wipes according to the label instructions. [email or web address] 11/22/2019 This information is not intended to replace advice given to you by your health care provider. Make sure you discuss any questions you have with your health care provider. Document Revised: 03/09/2020 Document Reviewed: 03/09/2020 Elsevier Patient Education  2021 Elsevier Inc. Heartburn During Pregnancy  Heartburn is a type of pain or discomfort in the throat or chest. It may cause a burning feeling. It happens when stomach acid backs up into the part of the body that moves food from your mouth to your stomach (esophagus). This condition is also called acid reflux. Heartburn is common during pregnancy. It usually goes away or gets  better after giving birth. What are the causes? This condition is caused by stomach acid that backs up into the part of the body that moves food from your mouth to your stomach. Acid can back up because of:  Changing amounts of hormones in the body.  Large meals.  Certain foods and drinks.  Exercise.  More acid being made in the stomach. What increases the risk? You are more likely to develop this condition if:  You had heartburn before you became pregnant.  You have been pregnant more than once before.  You are overweight or obese. Heartburn is also likely to happen as you get further along in your pregnancy. The risk is higher in the last 3 months before birth (third trimester). What are the signs or symptoms? Symptoms of this condition include:  Burning pain in the chest or lower throat.  A bitter taste in the mouth.  Coughing.  Problems swallowing.  Vomiting.  A hoarse voice.  Asthma. Symptoms may get worse when you lie down or bend over. You may feel worse at night. How is this treated? Treatment for this condition depends on how bad your symptoms are. Your doctor may ask you to:  Take over-the-counter medicines for mild heartburn. These medicines include antacids or acid reducers.  Take prescription medicines to reduce stomach acid or to protect your stomach.  Change your diet.  Raise the head of your bed so it is higher than the foot of the bed. Follow these instructions at home: Eating and drinking  Do not drink alcohol while you are pregnant.  Learn which foods and drinks make you feel worse, and avoid them.  Eat small meals often, instead of large meals.  Avoid drinking a lot of liquid with your meals.  Avoid eating meals during the 2-3 hours before you go to bed.  Avoid lying down right after you eat.  Do not exercise right after you eat. Drinks to avoid  Coffee and tea (with or without caffeine).  Energy drinks and sports  drinks.  Carbonated drinks or sodas.  Citrus fruit juices. Foods to avoid  Chocolate and cocoa.  Peppermint and mint flavorings.  Garlic, onions, and horseradish.  Spicy foods and foods that have a lot of acid in them. These include peppers, chili powder, curry powder, vinegar, hot sauces, and barbecue sauce.  Citrus fruits, such as oranges, lemons, and limes.  Tomato-based foods, such as red sauce, chili, and salsa.  Fried and fatty foods, such as donuts, french fries, potato chips, and high-fat dressings.  High-fat meats, such as hot dogs, precooked or cured meats, sausage, ham, and bacon.  High-fat dairy items, such as whole milk, butter, and cheese. Medicines  Take over-the-counter and prescription medicines only as told by your doctor.  Do not take aspirin or NSAIDs, such as ibuprofen, unless your doctor tells you to do that.  Your doctor may tell you to avoid medicines that have sodium bicarbonate in them. General instructions  If told, raise the head of your bed about 6 inches (15 cm). You can do this by putting blocks under the legs. Sleeping with more pillows does not help with heartburn.  Do not use any products that contain nicotine or tobacco, such as cigarettes, e-cigarettes, and chewing tobacco. If you need help quitting, ask your doctor.  Wear loose-fitting clothing.  Try to lower your stress, such as with yoga or meditation. If you need help, ask your doctor.  Stay at a healthy weight. If you are overweight, work with your doctor to safely manage your weight.  Keep all follow-up visits as told by your doctor. This is important. Where to find more information  American Pregnancy Association: americanpregnancy.org Contact a doctor if:  You get new symptoms.  Your symptoms do not get better with treatment.  You lose weight and you do not know why.  You have trouble swallowing.  You make loud sounds when you breathe (wheeze).  You have a cough  that does not go away.  You have heartburn often for more than 2 weeks.  You feel like you may vomit (nausea), or you vomit, and this does not get better with treatment.  You have pain in your belly (abdomen). Get help right away if:  You have very bad chest pain that spreads to your arm, neck, or jaw.  You feel sweaty, dizzy, or light-headed.  You have trouble breathing.  You have pain when swallowing.  You vomit, and your vomit looks like blood or coffee grounds.  Your poop (stool) is bloody or black. Summary  Heartburn in pregnancy is common, especially during the last 3 months before birth.  This condition is caused by stomach acid backing up into the part of the body that moves food from the mouth to the stomach.  This condition can be treated with medicines, changes to your diet, or raising the head of your bed.  Contact a doctor if your symptoms do not go away or you get new symptoms. This information is not intended to replace advice given to you by your health care  provider. Make sure you discuss any questions you have with your health care provider. Document Revised: 01/16/2019 Document Reviewed: 01/16/2019 Elsevier Patient Education  2021 [employer].

## 2020-06-04 NOTE — MAU Note (Signed)
Alexis Blanchard CNM in with bedside u/s. FHTs noted with active fetus. PT reassured

## 2020-06-04 NOTE — Progress Notes (Signed)
Written and verbal d/c instructions given and understanding voiced. 

## 2020-06-13 ENCOUNTER — Other Ambulatory Visit: Payer: Self-pay

## 2020-06-13 ENCOUNTER — Encounter (HOSPITAL_COMMUNITY): Payer: Self-pay | Admitting: Obstetrics and Gynecology

## 2020-06-13 ENCOUNTER — Inpatient Hospital Stay (HOSPITAL_COMMUNITY)
Admission: AD | Admit: 2020-06-13 | Discharge: 2020-06-13 | Disposition: A | Discharge status: Home / Self Care | Payer: Medicaid Other | Attending: Obstetrics and Gynecology | Admitting: Obstetrics and Gynecology

## 2020-06-13 DIAGNOSIS — O26891 Other specified pregnancy related conditions, first trimester: Secondary | ICD-10-CM | POA: Insufficient documentation

## 2020-06-13 DIAGNOSIS — B3731 Acute candidiasis of vulva and vagina: Secondary | ICD-10-CM

## 2020-06-13 DIAGNOSIS — Z3A13 13 weeks gestation of pregnancy: Secondary | ICD-10-CM | POA: Diagnosis not present

## 2020-06-13 DIAGNOSIS — O26851 Spotting complicating pregnancy, first trimester: Secondary | ICD-10-CM

## 2020-06-13 DIAGNOSIS — B373 Candidiasis of vulva and vagina: Secondary | ICD-10-CM

## 2020-06-13 DIAGNOSIS — O26852 Spotting complicating pregnancy, second trimester: Secondary | ICD-10-CM

## 2020-06-13 DIAGNOSIS — Z7982 Long term (current) use of aspirin: Secondary | ICD-10-CM | POA: Diagnosis not present

## 2020-06-13 DIAGNOSIS — O209 Hemorrhage in early pregnancy, unspecified: Secondary | ICD-10-CM | POA: Diagnosis not present

## 2020-06-13 DIAGNOSIS — Z79899 Other long term (current) drug therapy: Secondary | ICD-10-CM | POA: Insufficient documentation

## 2020-06-13 DIAGNOSIS — R109 Unspecified abdominal pain: Secondary | ICD-10-CM | POA: Diagnosis not present

## 2020-06-13 HISTORY — DX: Syphilis, unspecified: A53.9

## 2020-06-13 LAB — WET PREP, GENITAL
Clue Cells Wet Prep HPF POC: NONE SEEN
Sperm: NONE SEEN
Trich, Wet Prep: NONE SEEN

## 2020-06-13 LAB — URINALYSIS, ROUTINE W REFLEX MICROSCOPIC
Bilirubin Urine: NEGATIVE
Glucose, UA: NEGATIVE mg/dL
Ketones, ur: NEGATIVE mg/dL
Nitrite: NEGATIVE
Protein, ur: 100 mg/dL — AB
RBC / HPF: >50 RBC/hpf — ABNORMAL HIGH (ref 0–5)
Specific Gravity, Urine: 1.015 (ref 1.005–1.030)
WBC, UA: >50 WBC/hpf — ABNORMAL HIGH (ref 0–5)
pH: 7 (ref 5.0–8.0)

## 2020-06-13 MED ORDER — DOCUSATE SODIUM 100 MG PO CAPS: 100.0000 mg | ORAL_CAPSULE | Freq: Every day | ORAL | 1 refills | Status: DC

## 2020-06-13 MED ORDER — TERCONAZOLE 0.4 % VA CREA: 1.0000 | TOPICAL_CREAM | Freq: Every day | VAGINAL | 0 refills | Status: DC

## 2020-06-13 NOTE — Discharge Instructions (Signed)
Fiber Content in Foods Fiber is a substance that is found in plant foods, such as fruits, vegetables, whole grains, nuts, seeds, and beans. As part of your treatment and recovery plan, your health care provider may recommend that you eat foods that have specific amounts of dietary fiber. Some conditions may require a high-fiber diet while others may require a low-fiber diet. This sheet gives you information about the dietary fiber content of some common foods. Your health care provider will tell you how much fiber you need in your diet. If you have problems or questions, contact your health care provider or dietitian. What foods are high in fiber? Fruits  Blackberries or raspberries (fresh) --  cup (75 g) has 4 g of fiber.  Pear (fresh) -- 1 medium (180 g) has 5.5 g of fiber.  Prunes (dried) -- 6 to 8 pieces (57-76 g) has 5 g of fiber.  Apple with skin -- 1 medium (182 g) has 4.8 g of fiber.  Guava -- 1 cup (128 g) has 8.9 g of fiber. Vegetables  Peas (frozen) --  cup (80 g) has 4.4 g of fiber.  Potato with skin (baked) -- 1 medium (173 g) has 4.4 g of fiber.  Pumpkin (canned) --  cup (122 g) has 5 g of fiber.  Brussels sprouts (cooked) --  cup (78 g) has 4 g of fiber.  Sweet potato --  cup mashed (124 g) has 4 g of fiber.  Winter squash -- 1 cup cooked (205 g) has 5.7 g of fiber. Grains  Bran cereal --  cup (31 g) has 8.6 g of fiber.  Bulgur (cooked) --  cup (70 g) has 4 g of fiber.  Quinoa (cooked) -- 1 cup (185 g) has 5.2 g of fiber.  Popcorn -- 3 cups (375 g) popped has 5.8 g of fiber.  Spaghetti, whole wheat -- 1 cup (140 g) has 6 g of fiber. Meats and other proteins  Pinto beans (cooked) --  cup (90 g) has 7.7 g of fiber.  Lentils (cooked) --  cup (90 g) has 7.8 g of fiber.  Kidney beans (canned) --  cup (92.5 g) has 5.7 g of fiber.  Soybeans (canned, frozen, or fresh) --  cup (92.5 g) has 5.2 g of fiber.  Baked beans, plain or vegetarian (canned) --   cup (130 g) has 5.2 g of fiber.  Garbanzo beans or chickpeas (canned) --  cup (90 g) has 6.6 g of fiber.  Black beans (cooked) --  cup (86 g) has 7.5 g of fiber.  White beans or navy beans (cooked) --  cup (91 g) has 9.3 g of fiber. The items listed above may not be a complete list of foods with high fiber. Actual amounts of fiber may be different depending on processing. Contact a dietitian for more information.   What foods are moderate in fiber? Fruits  Banana -- 1 medium (126 g) has 3.2 g of fiber.  Melon -- 1 cup (155 g) has 1.4 g of fiber.  Orange -- 1 small (154 g) has 3.7 g of fiber.  Raisins --  cup (40 g) has 1.8 g of fiber.  Applesauce, sweetened --  cup (125 g) has 1.5 g of fiber.  Blueberries (fresh) --  cup (75 g) has 1.8 g of fiber.  Strawberries (fresh, sliced) -- 1 cup (150 g) has 3 g of fiber.  Cherries -- 1 cup (140 g) has 2.9 g of fiber. Vegetables    Broccoli (cooked) --  cup (77.5 g) has 2.1 g of fiber.  Carrots (cooked) --  cup (77.5 g) has 2.2 g of fiber.  Corn (canned or frozen) --  cup (82.5 g) has 2.1 g of fiber.  Potatoes, mashed --  cup (105 g) has 1.6 g of fiber.  Tomato -- 1 medium (62 g) has 1.5 g of fiber.  Green beans (canned) --  cup (83 g) has 2 g of fiber.  Squash, winter --  cup (58 g) has 1 g of fiber.  Sweet potato, baked -- 1 medium (150 g) has 3 g of fiber.  Cauliflower (cooked) -- 1/2 cup (90 g) has 2.3 g of fiber. Grains  Long-grain brown rice (cooked) -- 1 cup (196 g) has 3.5 g of fiber.  Bagel, plain -- one 4-inch (10 cm) bagel has 2 g of fiber.  Instant oatmeal --  cup (120 g) has about 2 g of fiber.  Macaroni noodles, enriched (cooked) -- 1 cup (140 g) has 2.5 g of fiber.  Multigrain cereal --  cup (15 g) has about 2-4 g of fiber.  Whole-wheat bread -- 1 slice (26 g) has 2 g of fiber.  Whole-wheat spaghetti noodles --  cup (70 g) has 3.2 g of fiber.  Corn tortilla -- one 6-inch (15 cm) tortilla  has 1.5 g of fiber. Meats and other proteins  Almonds --  cup or 1 oz (28 g) has 3.5 g of fiber.  Sunflower seeds in shell --  cup or  oz (11.5 g) has 1.1 g of fiber.  Vegetable or soy patty -- 1 patty (70 g) has 3.4 g of fiber.  Walnuts --  cup or 1 oz (30 g) has 2 g of fiber.  Flax seed -- 1 Tbsp (7 g) has 2.8 g of fiber. The items listed above may not be a complete list of foods that have moderate amounts of fiber. Actual amounts of fiber may be different depending on processing. Contact a dietitian for more information.   What foods are low in fiber? Low-fiber foods contain less than 1 g of fiber per serving. They include: Fruits  Fruit juice --  cup or 4 fl oz (118 mL) has 0.5 g of fiber. Vegetables  Lettuce -- 1 cup (35 g) has 0.5 g of fiber.  Cucumber (slices) --  cup (60 g) has 0.3 g of fiber.  Celery -- 1 stalk (40 g) has 0.1 g of fiber. Grains  Flour tortilla -- one 6-inch (15 cm) tortilla has 0.5 g of fiber.  White rice (cooked) --  cup (81.5 g) has 0.3 g of fiber. Meats and other proteins  Egg -- 1 large (50 g) has 0 g of fiber.  Meat, poultry, or fish -- 3 oz (85 g) has 0 g of fiber. Dairy  Milk -- 1 cup or 8 fl oz (237 mL) has 0 g of fiber.  Yogurt -- 1 cup (245 g) has 0 g of fiber. The items listed above may not be a complete list of foods that are low in fiber. Actual amounts of fiber may be different depending on processing. Contact a dietitian for more information.   Summary  Fiber is a substance that is found in plant foods, such as fruits, vegetables, whole grains, nuts, seeds, and beans.  As part of your treatment and recovery plan, your health care provider may recommend that you eat foods that have specific amounts of dietary fiber. This information is   not intended to replace advice given to you by your health care provider. Make sure you discuss any questions you have with your health care provider. Document Revised: 08/29/2019 Document  Reviewed: 08/29/2019 Elsevier Patient Education  2021 Elsevier Inc. Second Trimester of Pregnancy  The second trimester of pregnancy is from week 13 through week 27. This is months 4 through 6 of pregnancy. The second trimester is often a time when you feel your best. Your body has adjusted to being pregnant, and you begin to feel better physically. During the second trimester:  Morning sickness has lessened or stopped completely.  You may have more energy.  You may have an increase in appetite. The second trimester is also a time when the unborn baby (fetus) is growing rapidly. At the end of the sixth month, the fetus may be up to 12 inches long and weigh about 1 pounds. You will likely begin to feel the baby move (quickening) between 16 and 20 weeks of pregnancy. Body changes during your second trimester Your body continues to go through many changes during your second trimester. The changes vary and generally return to normal after the baby is born. Physical changes  Your weight will continue to increase. You will notice your lower abdomen bulging out.  You may begin to get stretch marks on your hips, abdomen, and breasts.  Your breasts will continue to grow and to become tender.  Dark spots or blotches (chloasma or mask of pregnancy) may develop on your face.  A dark line from your belly button to the pubic area (linea nigra) may appear.  You may have changes in your hair. These can include thickening of your hair, rapid growth, and changes in texture. Some people also have hair loss during or after pregnancy, or hair that feels dry or thin. Health changes  You may develop headaches.  You may have heartburn.  You may develop constipation.  You may develop hemorrhoids or swollen, bulging veins (varicose veins).  Your gums may bleed and may be sensitive to brushing and flossing.  You may urinate more often because the fetus is pressing on your bladder.  You may have back  pain. This is caused by: ? Weight gain. ? Pregnancy hormones that are relaxing the joints in your pelvis. ? A shift in weight and the muscles that support your balance. Follow these instructions at home: Medicines  Follow your health care provider's instructions regarding medicine use. Specific medicines may be either safe or unsafe to take during pregnancy. Do not take any medicines unless approved by your health care provider.  Take a prenatal vitamin that contains at least 600 micrograms (mcg) of folic acid. Eating and drinking  Eat a healthy diet that includes fresh fruits and vegetables, whole grains, good sources of protein such as meat, eggs, or tofu, and low-fat dairy products.  Avoid raw meat and unpasteurized juice, milk, and cheese. These carry germs that can harm you and your baby.  You may need to take these actions to prevent or treat constipation: ? Drink enough fluid to keep your urine pale yellow. ? Eat foods that are high in fiber, such as beans, whole grains, and fresh fruits and vegetables. ? Limit foods that are high in fat and processed sugars, such as fried or sweet foods. Activity  Exercise only as directed by your health care provider. Most people can continue their usual exercise routine during pregnancy. Try to exercise for 30 minutes at least 5 days a week.  Stop exercising if you develop contractions in your uterus.  Stop exercising if you develop pain or cramping in the lower abdomen or lower back.  Avoid exercising if it is very hot or humid or if you are at a high altitude.  Avoid heavy lifting.  If you choose to, you may have sex unless your health care provider tells you not to. Relieving pain and discomfort  Wear a supportive bra to prevent discomfort from breast tenderness.  Take warm sitz baths to soothe any pain or discomfort caused by hemorrhoids. Use hemorrhoid cream if your health care provider approves.  Rest with your legs raised  (elevated) if you have leg cramps or low back pain.  If you develop varicose veins: ? Wear support hose as told by your health care provider. ? Elevate your feet for 15 minutes, 3-4 times a day. ? Limit salt in your diet. Safety  Wear your seat belt at all times when driving or riding in a car.  Talk with your health care provider if someone is verbally or physically abusive to you. Lifestyle  Do not use hot tubs, steam rooms, or saunas.  Do not douche. Do not use tampons or scented sanitary pads.  Avoid cat litter boxes and soil used by cats. These carry germs that can cause birth defects in the baby and possibly loss of the fetus by miscarriage or stillbirth.  Do not use herbal remedies, alcohol, illegal drugs, or medicines that are not approved by your health care provider. Chemicals in these products can harm your baby.  Do not use any products that contain nicotine or tobacco, such as cigarettes, e-cigarettes, and chewing tobacco. If you need help quitting, ask your health care provider. General instructions  During a routine prenatal visit, your health care provider will do a physical exam and other tests. He or she will also discuss your overall health. Keep all follow-up visits. This is important.  Ask your health care provider for a referral to a local prenatal education class.  Ask for help if you have counseling or nutritional needs during pregnancy. Your health care provider can offer advice or refer you to specialists for help with various needs. Where to find more information  American Pregnancy Association: americanpregnancy.org  [employer]: https://www.todd-brady.net/  Office on [employer] [facility]: [employer] Contact a health care provider if you have:  A headache that does not go away when you take medicine.  Vision changes or you see spots in front of your eyes.  Mild pelvic cramps, pelvic  pressure, or nagging pain in the abdominal area.  Persistent nausea, vomiting, or diarrhea.  A bad-smelling vaginal discharge or foul-smelling urine.  Pain when you urinate.  Sudden or extreme swelling of your face, hands, ankles, feet, or legs.  A fever. Get help right away if you:  Have fluid leaking from your vagina.  Have spotting or bleeding from your vagina.  Have severe abdominal cramping or pain.  Have difficulty breathing.  Have chest pain.  Have fainting spells.  Have not felt your baby move for the time period told by your health care provider.  Have new or increased pain, swelling, or redness in an arm or leg. Summary  The second trimester of pregnancy is from week 13 through week 27 (months 4 through 6).  Do not use herbal remedies, alcohol, illegal drugs, or medicines that are not approved by your health care provider. Chemicals in these products can harm your baby.  Exercise only as directed by your health care provider. Most people can continue their usual exercise routine during pregnancy.  Keep all follow-up visits. This is important. This information is not intended to replace advice given to you by your health care provider. Make sure you discuss any questions you have with your health care provider. Document Revised: 10/02/2019 Document Reviewed: 08/08/2019 Elsevier Patient Education  2021 [employer].

## 2020-06-13 NOTE — MAU Provider Note (Signed)
History     CSN: [id number]  Arrival date and time: 06/13/20 9191   Event Date/Time   First Provider Initiated Contact with Patient 06/13/20 [phone]      Chief Complaint  Patient presents with  . Vaginal Bleeding   Alexis Blanchard is a 25 y.o. [phone] at 19w5dwho [facility]  She presents today for Vaginal Bleeding. She reports vaginal spotting that occurred around 2pm with wiping.  She states it is light red in color and was not accompanied by any pain. However, she reports some lower abdominal pain while en route to the hospital.  Patient denies pain or discomfort with urination, but reports some vaginal irritation.  She denies odor, itching, or abnormal discharge.  Patient reports sexual activity in the past 24 hours.    OB History    Gravida  4   Para  1   Term  0   Preterm  1   AB  2   Living  1     SAB  2   IAB  0   Ectopic  0   Multiple  0   Live Births  1           Past Medical History:  Diagnosis Date  . Severe preeclampsia    Delivered at 34 weeks  . Syphilis     Past Surgical History:  Procedure Laterality Date  . CESAREAN SECTION N/A 06/08/2017   Procedure: CESAREAN SECTION;  Surgeon: HLavonia Drafts MD;  [facility]  Service: Obstetrics;  Laterality: N/A;  . TONSILLECTOMY      Family History  Problem Relation Age of Onset  . Hypertension Mother   . Heart disease Maternal Aunt        CHF  . Diabetes Maternal Grandmother   . Hypertension Maternal Grandmother   . Heart disease Maternal Grandmother   . Cancer Maternal Aunt 45       colon  . Cancer Maternal Aunt 45       throat  . Congestive Heart Failure Maternal Uncle     Social History   Tobacco Use  . Smoking status: Never Smoker  . Smokeless tobacco: Never Used  Vaping Use  . Vaping Use: Never used  Substance Use Topics  . Alcohol use: Not Currently    Comment: pt states that she has not used since finding out about the  pregnancy  . Drug use: Yes    Types: Marijuana    Comment: pt states that she has not used since finding out about the pregnancy    Allergies: No Known Allergies  Medications Prior to Admission  Medication Sig Dispense Refill Last Dose  . Doxylamine-Pyridoxine (DICLEGIS) 10-10 MG TBEC Take 2 tablets by mouth at bedtime. 60 tablet 0 06/12/2020 at Unknown time  . famotidine (PEPCID) 20 MG tablet Take 1 tablet (20 mg total) by mouth 2 (two) times daily. 60 tablet 0 06/12/2020 at Unknown time  . Prenat w/o A Vit-FeFum-FePo-FA (CONCEPT OB) 130-92.4-1 MG CAPS Take 1 tablet by mouth daily. 30 capsule 12 06/12/2020 at Unknown time  . aspirin EC 81 MG tablet Take 1 tablet (81 mg total) by mouth daily. Take after 12 weeks for prevention of preeclampsia later in pregnancy 300 tablet 2   . Blood Pressure Monitoring (BLOOD PRESSURE KIT) DEVI 1 Device by Does not apply route once a week. To be monitored weekly from home 1 each 0   . cefadroxil (DURICEF) 500 MG capsule  Take 1 capsule (500 mg total) by mouth 2 (two) times daily. 14 capsule 0   . guaiFENesin (MUCINEX) 600 MG 12 hr tablet Take 1 tablet (600 mg total) by mouth 2 (two) times daily for 60 doses. 60 tablet 0     Review of Systems  Constitutional: Negative for chills and fever.  Respiratory: Negative for cough and shortness of breath.   Gastrointestinal: Positive for constipation. Negative for abdominal pain, nausea and vomiting.  Genitourinary: Positive for vaginal bleeding. Negative for difficulty urinating, dysuria, pelvic pain and vaginal discharge.  Neurological: Negative for dizziness, light-headedness and headaches.   Physical Exam   Blood pressure 121/70, pulse 91, temperature 98.8 F (37.1 C), resp. rate 17, weight 54.5 kg, last menstrual period 03/09/2020.  Physical Exam Vitals reviewed.  Constitutional:      Appearance: Normal appearance.  HENT:     Head: Normocephalic and atraumatic.  Eyes:     Conjunctiva/sclera: Conjunctivae  normal.  Cardiovascular:     Rate and Rhythm: Normal rate and regular rhythm.  Pulmonary:     Effort: Pulmonary effort is normal.  Abdominal:     Palpations: Abdomen is soft.     Tenderness: There is abdominal tenderness in the right lower quadrant. There is no guarding.  Genitourinary:    Comments: Speculum Exam: -Normal External Genitalia: Non tender, no apparent discharge at introitus.  -Vaginal Vault: Pink mucosa with good rugae. Scant amt granule type white discharge -wet prep collected -Cervix:Pink, no lesions, cysts, or polyps.  Appears closed. No active bleeding from os -Bimanual Exam:  Closed  Musculoskeletal:        General: Normal range of motion.     Cervical back: Normal range of motion. No rigidity.  Skin:    General: Skin is warm and dry.  Neurological:     Mental Status: She is alert and oriented to person, place, and time.  Psychiatric:        Mood and Affect: Mood normal.        Behavior: Behavior normal.        Thought Content: Thought content normal.     MAU Course  Procedures Results for orders placed or performed during the hospital encounter of 06/13/20 (from the past 24 hour(s))  Urinalysis, Routine w reflex microscopic     Status: Abnormal   Collection Time: 06/13/20  6:04 AM  Result Value Ref Range   Color, Urine YELLOW YELLOW   APPearance CLOUDY (A) CLEAR   Specific Gravity, Urine 1.015 1.005 - 1.030   pH 7.0 5.0 - 8.0   Glucose, UA NEGATIVE NEGATIVE mg/dL   Hgb urine dipstick LARGE (A) NEGATIVE   Bilirubin Urine NEGATIVE NEGATIVE   Ketones, ur NEGATIVE NEGATIVE mg/dL   Protein, ur 100 (A) NEGATIVE mg/dL   Nitrite NEGATIVE NEGATIVE   Leukocytes,Ua LARGE (A) NEGATIVE   RBC / HPF >50 (H) 0 - 5 RBC/hpf   WBC, UA >50 (H) 0 - 5 WBC/hpf   Bacteria, UA RARE (A) NONE SEEN   Squamous Epithelial / LPF 0-5 0 - 5   Mucus PRESENT    Non Squamous Epithelial 0-5 (A) NONE SEEN  Wet prep, genital     Status: Abnormal   Collection Time: 06/13/20  8:19 AM   Result Value Ref Range   Yeast Wet Prep HPF POC PRESENT (A) NONE SEEN   Trich, Wet Prep NONE SEEN NONE SEEN   Clue Cells Wet Prep HPF POC NONE SEEN NONE SEEN   WBC, Wet Prep  HPF POC RARE (A) NONE SEEN   Sperm NONE SEEN     MDM Pelvic Exam Labs: Wet prep, UA, UC Assessment and Plan  25 year old, [id number]  SIUP at 13.5weeks Vaginal Spotting  -Reviewed POC with patient. -Exam performed and findings discussed.  -Culture collected and pending.  -Patient offered and declines pain medication. -Discussed normal occurrence of spotting after cervical irritations such as after sexual activity or passing of hard bowel movement.  -Discussed starting on stool softener to decrease incidents of constipation/hard stool and patient agreeable. -[facility]  -Instructed to keep next appt as scheduled. -Will await results.    JMaryann Conners2/09/2020, 6:57 AM   Reassessment (8:34 AM)  -Lab reports wet prep sample not able to be processed. -Nurse collects 2nd swab blindly. -Patient requests notification, via mychart, regarding results. -Encouraged to call or return to MAU if symptoms worsen or with the onset of new symptoms. -Discharged to home in stable condition. -Results return prior to patient off unit and nurse instructed to inform patient of yeast and treatment for Terazol 7 to pharmacy.   JMaryann ConnersMSN, CNM Advanced Practice Provider, [facility]

## 2020-06-13 NOTE — MAU Note (Addendum)
Patient woke up around 0200 to use the restroom and noticed she had some spotting.  Then she started having lower abdominal pain and then had pressure (like she had to have a BM) which occurred intermittently every 5 mins. Last intercourse yesterday.

## 2020-06-16 ENCOUNTER — Other Ambulatory Visit: Payer: Self-pay

## 2020-06-16 ENCOUNTER — Ambulatory Visit (INDEPENDENT_AMBULATORY_CARE_PROVIDER_SITE_OTHER): Payer: Medicaid Other | Admitting: Obstetrics & Gynecology

## 2020-06-16 VITALS — BP 115/80 | HR 91 | Wt 126.2 lb

## 2020-06-16 DIAGNOSIS — O2341 Unspecified infection of urinary tract in pregnancy, first trimester: Secondary | ICD-10-CM

## 2020-06-16 DIAGNOSIS — O98511 Other viral diseases complicating pregnancy, first trimester: Secondary | ICD-10-CM | POA: Insufficient documentation

## 2020-06-16 DIAGNOSIS — U071 COVID-19: Secondary | ICD-10-CM

## 2020-06-16 DIAGNOSIS — Z3482 Encounter for supervision of other normal pregnancy, second trimester: Secondary | ICD-10-CM

## 2020-06-16 DIAGNOSIS — O2342 Unspecified infection of urinary tract in pregnancy, second trimester: Secondary | ICD-10-CM

## 2020-06-16 DIAGNOSIS — O09299 Supervision of pregnancy with other poor reproductive or obstetric history, unspecified trimester: Secondary | ICD-10-CM

## 2020-06-16 DIAGNOSIS — Z3A14 14 weeks gestation of pregnancy: Secondary | ICD-10-CM

## 2020-06-16 HISTORY — DX: COVID-19: U07.1

## 2020-06-16 NOTE — Patient Instructions (Signed)

## 2020-06-16 NOTE — Progress Notes (Signed)
   PRENATAL VISIT NOTE  Subjective:  Alexis Blanchard is a 25 y.o. [phone] at [redacted]w[redacted]d being seen today for ongoing prenatal care.  She is currently monitored for the following issues for this high-risk pregnancy and has Alpha thalassemia silent carrier; History of severe preeclampsia in prior pregnancy needing delivery at 34 weeks; Previous cesarean delivery for breech at 34 weeks for severe preeclampsia, antepartum; Supervision of other normal pregnancy, antepartum; UTI in pregnancy, first trimester; False positive RPR test; ASCUS with positive high risk HPV cervical on 05/20/2020; and COVID-19 affecting pregnancy in first trimester on their problem list.  Patient reports no complaints.  Contractions: Not present. Vag. Bleeding: None.   . Denies leaking of fluid.   The following portions of the patient's history were reviewed and updated as appropriate: allergies, current medications, past family history, past medical history, past social history, past surgical history and problem list.   Objective:   Vitals:   06/16/20 1534  BP: 115/80  Pulse: 91  Weight: 126 lb 3.2 oz (57.2 kg)    Fetal Status: Fetal Heart Rate (bpm): 165         General:  Alert, oriented and cooperative. Patient is in no acute distress.  Skin: Skin is warm and dry. No rash noted.   Cardiovascular: Normal heart rate noted  Respiratory: Normal respiratory effort, no problems with respiration noted  Abdomen: Soft, gravid, appropriate for gestational age.  Pain/Pressure: Absent     Pelvic: Cervical exam deferred        Extremities: Normal range of motion.     Mental Status: Normal mood and affect. Normal behavior. Normal judgment and thought content.   Assessment and Plan:  Pregnancy: [id number] at [redacted]w[redacted]d 1. COVID-19 affecting pregnancy in first trimester Positive COVID test on 06/03/20, no current symptoms. Doing well.  2. UTI in pregnancy, first trimester Test of cure to be done today - Culture, OB Urine  3. History  of severe preeclampsia in prior pregnancy needing delivery at 34 weeks Stable BP, continue ASA.  4. [redacted] weeks gestation of pregnancy 5. Encounter for supervision of other normal pregnancy in second trimester No other complaints or concerns.  Routine obstetric precautions reviewed. Anatomy scan already scheduled.  Please refer to After Visit Summary for other counseling recommendations.   Return in about 4 weeks (around 07/14/2020) for OFFICE OB VISIT (MD or APP) and AFP screen if desired.  Future Appointments  Date Time Provider Department Center  07/20/2020  2:45 PM [facility]    Jaynie Collins, MD

## 2020-06-19 ENCOUNTER — Telehealth: Payer: Self-pay

## 2020-06-19 ENCOUNTER — Other Ambulatory Visit: Payer: Self-pay

## 2020-06-19 LAB — CULTURE, OB URINE

## 2020-06-19 LAB — URINE CULTURE, OB REFLEX

## 2020-06-19 MED ORDER — VITAFOL GUMMIES 3.33-0.333-34.8 MG PO CHEW: 1.0000 | CHEWABLE_TABLET | Freq: Every day | ORAL | 5 refills | Status: DC

## 2020-06-19 NOTE — Telephone Encounter (Signed)
Pt requesting prenatal vitamins, Rx sent in to pharmacy.

## 2020-06-22 MED ORDER — NITROFURANTOIN MONOHYD MACRO 100 MG PO CAPS: 100.0000 mg | ORAL_CAPSULE | Freq: Two times a day (BID) | ORAL | 1 refills | Status: DC

## 2020-06-22 NOTE — Addendum Note (Signed)
Addended by: Jaynie Collins A on: 06/22/2020 08:28 AM   Modules accepted: Orders

## 2020-06-26 ENCOUNTER — Inpatient Hospital Stay (HOSPITAL_COMMUNITY)
Admission: AD | Admit: 2020-06-26 | Discharge: 2020-06-26 | Disposition: A | Discharge status: Home / Self Care | Payer: Medicaid Other | Attending: Obstetrics and Gynecology | Admitting: Obstetrics and Gynecology

## 2020-06-26 ENCOUNTER — Other Ambulatory Visit: Payer: Self-pay

## 2020-06-26 ENCOUNTER — Encounter (HOSPITAL_COMMUNITY): Payer: Self-pay | Admitting: Obstetrics and Gynecology

## 2020-06-26 DIAGNOSIS — R112 Nausea with vomiting, unspecified: Secondary | ICD-10-CM

## 2020-06-26 DIAGNOSIS — O26892 Other specified pregnancy related conditions, second trimester: Secondary | ICD-10-CM

## 2020-06-26 DIAGNOSIS — R109 Unspecified abdominal pain: Secondary | ICD-10-CM | POA: Insufficient documentation

## 2020-06-26 DIAGNOSIS — O210 Mild hyperemesis gravidarum: Secondary | ICD-10-CM | POA: Diagnosis not present

## 2020-06-26 DIAGNOSIS — R197 Diarrhea, unspecified: Secondary | ICD-10-CM | POA: Diagnosis not present

## 2020-06-26 DIAGNOSIS — R198 Other specified symptoms and signs involving the digestive system and abdomen: Secondary | ICD-10-CM

## 2020-06-26 DIAGNOSIS — Z3A15 15 weeks gestation of pregnancy: Secondary | ICD-10-CM | POA: Diagnosis not present

## 2020-06-26 LAB — URINALYSIS, ROUTINE W REFLEX MICROSCOPIC
Bilirubin Urine: NEGATIVE
Glucose, UA: NEGATIVE mg/dL
Hgb urine dipstick: NEGATIVE
Ketones, ur: NEGATIVE mg/dL
Leukocytes,Ua: NEGATIVE
Nitrite: NEGATIVE
Protein, ur: NEGATIVE mg/dL
Specific Gravity, Urine: 1.019 (ref 1.005–1.030)
pH: 7 (ref 5.0–8.0)

## 2020-06-26 MED ORDER — LIDOCAINE VISCOUS HCL 2 % MT SOLN
15.0000 mL | Freq: Once | OROMUCOSAL | Status: AC
  Administered 2020-06-26: 15 mL via ORAL
  Filled 2020-06-26: qty 15

## 2020-06-26 MED ORDER — ALUM & MAG HYDROXIDE-SIMETH 200-200-20 MG/5ML PO SUSP
30.0000 mL | Freq: Once | ORAL | Status: AC
  Administered 2020-06-26: 30 mL via ORAL
  Filled 2020-06-26: qty 30

## 2020-06-26 NOTE — MAU Note (Signed)
Alexis Blanchard is a 25 y.o. at [redacted]w[redacted]d here in MAU reporting: abdominal pain since last night. States she started Macrobid yesterday for UTI and abdominal pain started after that. No bleeding or discharge.  Onset of complaint: last night  Pain score: 5/10  FHT: 158  Lab orders placed from triage: UA

## 2020-06-26 NOTE — Discharge Instructions (Signed)
Eating Plan for Pregnant Women While you are pregnant, your body requires additional nutrition to help support your growing baby. You also have a higher need for some vitamins and minerals, such as folic acid, calcium, iron, and vitamin D. Eating a healthy, well-balanced diet is very important for your health and your baby's health. Your need for extra calories varies over the course of your pregnancy. Pregnancy is divided into three trimesters, with each trimester lasting 3 months. For most women, it is recommended to consume:  150 extra calories a day during the first trimester.  300 extra calories a day during the second trimester.  300 extra calories a day during the third trimester. What are tips for following this plan? Cooking  Practice good food safety and cleanliness. Wash your hands before you eat and after you prepare raw meat. Wash all fruits and vegetables well before peeling or eating. Taking these actions can help to prevent foodborne illnesses that can be very dangerous to your baby, such as listeriosis. Ask your health care provider for more information about listeriosis.  Make sure that all meats, poultry, and eggs are cooked to food-safe temperatures or "well-done." Meal planning  Eat a variety of foods (especially fruits and vegetables) to get a full range of vitamins and minerals.  Two or more servings of fish are recommended each week in order to get the most benefits from omega-3 fatty acids that are found in seafood. Choose fish that are lower in mercury, such as salmon and pollock.  Limit your overall intake of foods that have "empty calories." These are foods that have little nutritional value, such as sweets, desserts, candies, and sugar-sweetened beverages.  Drinks that contain caffeine are okay to drink, but it is better to avoid caffeine. Keep your total caffeine intake to less than 200 mg each day (which is 12 oz or 355 mL of coffee, tea, or soda) or the limit as  told by your health care provider.   General information  Do not try to lose weight or go on a diet during pregnancy.  Take a prenatal vitamin to help meet your additional vitamin and mineral needs during pregnancy, specifically for folic acid, iron, calcium, and vitamin D.  Remember to stay active. Ask your health care provider what types of exercise and activities are safe for you. What does 150 extra calories look like? Healthy options that provide 150 extra calories each day could be any of the following:  6-8 oz (170-227 g) plain low-fat yogurt with  cup (70 g) berries.  1 apple with 2 tsp (11 g) peanut butter.  Cut-up vegetables with  cup (60 g) hummus.  8 fl oz (237 mL) low-fat chocolate milk.  1 stick of string cheese with 1 medium orange.  1 peanut butter and jelly sandwich that is made with one slice of whole-wheat bread and 1 tsp (5 g) of peanut butter. For 300 extra calories, you could eat two of these healthy options each day. What is a healthy amount of weight to gain? The right amount of weight gain for you is based on your BMI (body mass index) before you became pregnant.  If your BMI was less than 18 (underweight), you should gain 28-40 lb (13-18 kg).  If your BMI was 18-24.9 (normal), you should gain 25-35 lb (11-16 kg).  If your BMI was 25-29.9 (overweight), you should gain 15-25 lb (7-11 kg).  If your BMI was 30 or greater (obese), you should gain 11-20 lb (  5-9 kg). What if I am having twins or multiples? Generally, if you are carrying twins or multiples:  You may need to eat 300-600 extra calories a day.  The recommended range for total weight gain is 25-54 lb (11-25 kg), depending on your BMI before pregnancy.  Talk with your health care provider to find out about nutritional needs, weight gain, and exercise that is right for you. What foods should I eat? Fruits All fruits. Eat a variety of colors and types of fruit. Remember to wash your fruits well  before peeling or eating. Vegetables All vegetables. Eat a variety of colors and types of vegetables. Remember to wash your vegetables well before peeling or eating. Grains All grains. Choose whole grains, such as whole-wheat bread, oatmeal, or brown rice. Meats and other protein foods Lean meats, including chicken, [place], and lean cuts of beef, veal, or pork. Fish that is higher in omega-3 fatty acids and lower in mercury, such as salmon, herring, mussels, trout, sardines, pollock, shrimp, crab, and lobster. Tofu. Tempeh. Beans. Eggs. Peanut butter and other nut butters. Dairy Pasteurized milk and milk alternatives, such as almond milk. Pasteurized yogurt and pasteurized cheese. Cottage cheese. Sour cream. Beverages Water. Juices that contain 100% fruit juice or vegetable juice. Caffeine-free teas and decaffeinated coffee. Fats and oils Fats and oils are okay to include in moderation. Sweets and desserts Sweets and desserts are okay to include in moderation. Seasoning and other foods All pasteurized condiments. The items listed above may not be a complete list of foods and beverages you can eat. Contact a dietitian for more information.   What foods should I avoid? Fruits Raw (unpasteurized) fruit juices. Vegetables Unpasteurized vegetable juices. Meats and other protein foods Precooked or cured meat, such as bologna, hot dogs, sausages, or meat loaves. (If you must eat those meats, reheat them until they are steaming hot.) Refrigerated pate, meat spreads from a meat counter, or smoked seafood that is found in the refrigerated section of a store. Raw or undercooked meats, poultry, and eggs. Raw fish, such as sushi or sashimi. Fish that have high mercury content, such as tilefish, shark, swordfish, and king mackerel. Dairy Unpasteurized milk and any foods that have unpasteurized milk in them. Soft cheeses, such as feta, queso blanco, queso fresco, [place], [place], panela, and blue-veined  cheeses (unless they are made with pasteurized milk, which must be stated on the label). Beverages Alcohol. Sugar-sweetened beverages, such as sodas, teas, or energy drinks. Seasoning and other foods Homemade fermented foods and drinks, such as pickles, sauerkraut, or kombucha drinks. (Store-bought pasteurized versions of these are okay.) Salads that are made in a store or deli, such as ham salad, chicken salad, egg salad, tuna salad, and seafood salad. The items listed above may not be a complete list of foods and beverages you should avoid. Contact a dietitian for more information. Where to find more information To calculate the number of calories you need based on your height, weight, and activity level, you can use an online calculator such as:  https://www.hunter.com/ To calculate how much weight you should gain during pregnancy, you can use an online pregnancy weight gain calculator such as:  MassVoice.es To learn more about eating fish during pregnancy, talk with your health care provider or visit:  [employer] Summary  While you are pregnant, your body requires additional nutrition to help support your growing baby.  Eat a variety of foods, especially fruits and vegetables, to get a full range of vitamins and minerals.  Practice good food safety and cleanliness. Wash your hands before you eat and after you prepare raw meat. Wash all fruits and vegetables well before peeling or eating. Taking these actions can help to prevent foodborne illnesses, such as listeriosis, that can be very dangerous to your baby.  Do not eat raw meat or fish. Do not eat fish that have high mercury content, such as tilefish, shark, swordfish, and king mackerel. Do not eat raw (unpasteurized) dairy.  Take a prenatal vitamin to help meet your additional vitamin and mineral needs during pregnancy, specifically for folic acid, iron, calcium, and vitamin D. This information is not intended to replace  advice given to you by your health care provider. Make sure you discuss any questions you have with your health care provider. Document Revised: 11/21/2019 Document Reviewed: 11/21/2019 Elsevier Patient Education  2021 [employer]   

## 2020-06-26 NOTE — MAU Provider Note (Addendum)
Patient Alexis Blanchard is a [id number] here at  104w4dwith abdominal pain. She rates it a 5/10. The pain started last night at 9pm. She denies abnormal discharge, vaginal bleeding. She endorses mild dysuria (she started Macrobid). She takes her Macrobid with food or juice.  She reports that she has occasional vomiting and diarrhea.  She has an episode of diarrhea.   Her diet intake yesterday was: breakfast-cereal, lunch-turkey and cheese sandwich and a cup of fruit, for dinner she had hamburger helper at 8pm.  History     CSN: [id number] Arrival date and time: 06/26/20 1043   None     No chief complaint on file.  Abdominal Pain This is a new problem. The current episode started yesterday. The problem occurs intermittently. The pain is located in the epigastric region and suprapubic region (pain moves around). The pain is at a severity of 5/10. The abdominal pain does not radiate. Associated symptoms include diarrhea, nausea and vomiting. Nothing aggravates the pain. The pain is relieved by nothing.   She reports that it was an 8/10. The pain lasts about 15-30 seconds. She has felt it twice since she has been in MAU (has been in MAU for 45 min). She denies any changes to her appetite.  OB History    Gravida  4   Para  1   Term  0   Preterm  1   AB  2   Living  1     SAB  2   IAB  0   Ectopic  0   Multiple  0   Live Births  1           Past Medical History:  Diagnosis Date  . Severe preeclampsia    Delivered at 34 weeks  . Syphilis     Past Surgical History:  Procedure Laterality Date  . CESAREAN SECTION N/A 06/08/2017   Procedure: CESAREAN SECTION;  Surgeon: HLavonia Drafts MD;  [place] [facility]  Service: Obstetrics;  Laterality: N/A;  . TONSILLECTOMY      Family History  Problem Relation Age of Onset  . Hypertension Mother   . Heart disease Maternal Aunt        CHF  . Diabetes Maternal Grandmother   . Hypertension Maternal  Grandmother   . Heart disease Maternal Grandmother   . Cancer Maternal Aunt 45       colon  . Cancer Maternal Aunt 45       throat  . Congestive Heart Failure Maternal Uncle     Social History   Tobacco Use  . Smoking status: Never Smoker  . Smokeless tobacco: Never Used  Vaping Use  . Vaping Use: Never used  Substance Use Topics  . Alcohol use: Not Currently    Comment: pt states that she has not used since finding out about the pregnancy  . Drug use: Not Currently    Types: Marijuana    Comment: pt states that she has not used since finding out about the pregnancy    Allergies: No Known Allergies  Medications Prior to Admission  Medication Sig Dispense Refill Last Dose  . famotidine (PEPCID) 20 MG tablet Take 1 tablet (20 mg total) by mouth 2 (two) times daily. 60 tablet 0 06/25/2020 at Unknown time  . nitrofurantoin, macrocrystal-monohydrate, (MACROBID) 100 MG capsule Take 1 capsule (100 mg total) by mouth 2 (two) times daily. 14 capsule 1 06/26/2020 at Unknown time  . Prenat w/o A Vit-FeFum-FePo-FA (  CONCEPT OB) 130-92.4-1 MG CAPS Take 1 tablet by mouth daily. 30 capsule 12 06/26/2020 at Unknown time  . aspirin EC 81 MG tablet Take 1 tablet (81 mg total) by mouth daily. Take after 12 weeks for prevention of preeclampsia later in pregnancy 300 tablet 2 More than a month at Unknown time  . Blood Pressure Monitoring (BLOOD PRESSURE KIT) DEVI 1 Device by Does not apply route once a week. To be monitored weekly from home 1 each 0   . Doxylamine-Pyridoxine (DICLEGIS) 10-10 MG TBEC Take 2 tablets by mouth at bedtime. 60 tablet 0   . Prenatal Vit-Fe Phos-FA-Omega (VITAFOL GUMMIES) 3.33-0.333-34.8 MG CHEW Chew 1 tablet by mouth daily. 90 tablet 5   . terconazole (TERAZOL 7) 0.4 % vaginal cream Place 1 applicator vaginally at bedtime. 45 g 0     Review of Systems  Respiratory: Negative.   Gastrointestinal: Positive for abdominal pain, diarrhea, nausea and vomiting.  Genitourinary:  Negative for vaginal bleeding, vaginal discharge and vaginal pain.  Neurological: Negative.   Hematological: Negative.   Psychiatric/Behavioral: Negative.    Physical Exam   Blood pressure 121/68, pulse 88, temperature 98.3 F (36.8 C), temperature source Oral, resp. rate 16, height _0  (1.549 m), weight 55.4 kg, last menstrual period 03/09/2020, SpO2 99 %.  Physical Exam Abdominal:     General: Abdomen is flat.     Palpations: Abdomen is soft.  Musculoskeletal:     Cervical back: Normal range of motion.  Neurological:     General: No focal deficit present.     Mental Status: She is alert.  Psychiatric:        Mood and Affect: Mood normal.     MAU Course  Procedures  MDM FHR 160 -patient is afebrile, no fever, no vomiting while in MAU. She is well appearing, calm and talkative in bed.   Patient abdomen is soft, non-tender, non distended.   Since she has been in MAU, she reports that the pain has stayed the same.   She will try a GI cocktail.   1315: patient reports that she now feels better, complete resolve of symptoms.  Assessment and Plan   1. Gastrointestinal symptoms related to pregnancy in second trimester   -Reassurance given to patient that this is normal, and may be related to her taking antibiotics.  -Encouraged patient to take abx not on empty stomach, don't miss doses of abx -Keep follow up appt -return to MAU if worsening pain or vaginal bleeding.  -All questions answered.     Mervyn Skeeters Aiven Kampe 06/26/2020, 11:48 AM

## 2020-07-03 ENCOUNTER — Encounter (HOSPITAL_COMMUNITY): Payer: Self-pay | Admitting: Obstetrics and Gynecology

## 2020-07-03 ENCOUNTER — Inpatient Hospital Stay (HOSPITAL_COMMUNITY)
Admission: AD | Admit: 2020-07-03 | Discharge: 2020-07-04 | Disposition: A | Discharge status: Home / Self Care | Payer: Medicaid Other | Attending: Obstetrics and Gynecology | Admitting: Obstetrics and Gynecology

## 2020-07-03 ENCOUNTER — Other Ambulatory Visit: Payer: Self-pay

## 2020-07-03 DIAGNOSIS — E860 Dehydration: Secondary | ICD-10-CM | POA: Insufficient documentation

## 2020-07-03 DIAGNOSIS — R1030 Lower abdominal pain, unspecified: Secondary | ICD-10-CM | POA: Diagnosis not present

## 2020-07-03 DIAGNOSIS — Z3A16 16 weeks gestation of pregnancy: Secondary | ICD-10-CM | POA: Insufficient documentation

## 2020-07-03 DIAGNOSIS — O99282 Endocrine, nutritional and metabolic diseases complicating pregnancy, second trimester: Secondary | ICD-10-CM | POA: Insufficient documentation

## 2020-07-03 DIAGNOSIS — O26892 Other specified pregnancy related conditions, second trimester: Secondary | ICD-10-CM | POA: Insufficient documentation

## 2020-07-03 DIAGNOSIS — O26899 Other specified pregnancy related conditions, unspecified trimester: Secondary | ICD-10-CM

## 2020-07-03 DIAGNOSIS — R1084 Generalized abdominal pain: Secondary | ICD-10-CM | POA: Diagnosis not present

## 2020-07-03 DIAGNOSIS — O99280 Endocrine, nutritional and metabolic diseases complicating pregnancy, unspecified trimester: Secondary | ICD-10-CM

## 2020-07-03 MED ORDER — LACTATED RINGERS IV BOLUS
1000.0000 mL | Freq: Once | INTRAVENOUS | Status: AC
  Administered 2020-07-04: 1000 mL via INTRAVENOUS

## 2020-07-03 MED ORDER — ACETAMINOPHEN 500 MG PO TABS
1000.0000 mg | ORAL_TABLET | Freq: Once | ORAL | Status: AC
  Administered 2020-07-04: 1000 mg via ORAL
  Filled 2020-07-03: qty 2

## 2020-07-03 MED ORDER — CYCLOBENZAPRINE HCL 5 MG PO TABS
10.0000 mg | ORAL_TABLET | Freq: Once | ORAL | Status: AC
  Administered 2020-07-04: 10 mg via ORAL
  Filled 2020-07-03: qty 2

## 2020-07-03 NOTE — MAU Note (Signed)
Patient reports to MAU via EMS stating that she was walking down the road to her mom's house and she began having intense, sharp lower abdominal pain.  Rates pain 10/10. Denies vaginal bleeding and discharge.

## 2020-07-03 NOTE — MAU Provider Note (Signed)
History     CSN: [id number]  Arrival date and time: 07/03/20 2312   Event Date/Time   First Provider Initiated Contact with Patient 07/03/20 2347      Chief Complaint  Patient presents with  . Abdominal Pain   Alexis Blanchard is a 25 y.o. G4P1 at 75w4dwho presents to MAU with complaints of abdominal pain. Patient reports [place] [facility] [place] [facility] [place] - got halfway through walking and started having lower abdominal pain. Describes pain as sharp stabbing abdominal pain -rates pain 10/10. She reports that she was not hydrating or drinking any water on her walk. Stopped in the store on her walk due to the pain, tried to call mother -no answer, called EMS. Denies vaginal bleeding, discharge or LOF.    OB History    Gravida  4   Para  1   Term  0   Preterm  1   AB  2   Living  1     SAB  2   IAB  0   Ectopic  0   Multiple  0   Live Births  1           Past Medical History:  Diagnosis Date  . Severe preeclampsia    Delivered at 34 weeks  . Syphilis     Past Surgical History:  Procedure Laterality Date  . CESAREAN SECTION N/A 06/08/2017   Procedure: CESAREAN SECTION;  Surgeon: HLavonia Drafts MD;  [place] [facility]  Service: Obstetrics;  Laterality: N/A;  . TONSILLECTOMY      Family History  Problem Relation Age of Onset  . Hypertension Mother   . Heart disease Maternal Aunt        CHF  . Diabetes Maternal Grandmother   . Hypertension Maternal Grandmother   . Heart disease Maternal Grandmother   . Cancer Maternal Aunt 45       colon  . Cancer Maternal Aunt 45       throat  . Congestive Heart Failure Maternal Uncle     Social History   Tobacco Use  . Smoking status: Never Smoker  . Smokeless tobacco: Never Used  Vaping Use  . Vaping Use: Never used  Substance Use Topics  . Alcohol use: Not Currently    Comment: pt states that she has not used since finding  out about the pregnancy  . Drug use: Not Currently    Types: Marijuana    Comment: pt states that she has not used since finding out about the pregnancy    Allergies: No Known Allergies  Medications Prior to Admission  Medication Sig Dispense Refill Last Dose  . Prenat w/o A Vit-FeFum-FePo-FA (CONCEPT OB) 130-92.4-1 MG CAPS Take 1 tablet by mouth daily. 30 capsule 12 07/03/2020 at Unknown time  . aspirin EC 81 MG tablet Take 1 tablet (81 mg total) by mouth daily. Take after 12 weeks for prevention of preeclampsia later in pregnancy 300 tablet 2   . Blood Pressure Monitoring (BLOOD PRESSURE KIT) DEVI 1 Device by Does not apply route once a week. To be monitored weekly from home 1 each 0   . Doxylamine-Pyridoxine (DICLEGIS) 10-10 MG TBEC Take 2 tablets by mouth at bedtime. 60 tablet 0   . famotidine (PEPCID) 20 MG tablet Take 1 tablet (20 mg total) by mouth 2 (two) times daily. 60 tablet 0   . nitrofurantoin, macrocrystal-monohydrate, (MACROBID) 100 MG  capsule Take 1 capsule (100 mg total) by mouth 2 (two) times daily. 14 capsule 1   . Prenatal Vit-Fe Phos-FA-Omega (VITAFOL GUMMIES) 3.33-0.333-34.8 MG CHEW Chew 1 tablet by mouth daily. 90 tablet 5     Review of Systems  Constitutional: Negative.   Respiratory: Negative.   Cardiovascular: Negative.   Gastrointestinal: Positive for abdominal pain. Negative for constipation, diarrhea, nausea and vomiting.  Genitourinary: Negative.   Musculoskeletal: Negative.   Neurological: Negative.   Psychiatric/Behavioral: Negative.    Physical Exam   Blood pressure 135/87, pulse (!) 124, temperature 98.3 F (36.8 C), temperature source Oral, resp. rate 16, last menstrual period 03/09/2020, SpO2 100 %.  Physical Exam Vitals and nursing note reviewed.  HENT:     Head: Normocephalic.  Cardiovascular:     Rate and Rhythm: Normal rate and regular rhythm.  Pulmonary:     Effort: Pulmonary effort is normal. No respiratory distress.     Breath  sounds: Normal breath sounds. No wheezing.  Abdominal:     Palpations: Abdomen is soft. There is no mass.     Tenderness: There is abdominal tenderness. There is no guarding.     Comments: Gravid appropriate for GA. Tenderness with palpation across round ligaments   Skin:    General: Skin is warm and dry.  Neurological:     Mental Status: She is alert and oriented to person, place, and time.  Psychiatric:        Mood and Affect: Mood normal.        Behavior: Behavior normal.        Thought Content: Thought content normal.    FHR 156 by doppler   Dilation: Closed Effacement (%): Thick Cervical Position: Posterior Exam by:: Wende Bushy CNM   MAU Course  Procedures  MDM Orders Placed This Encounter  Procedures  . Urinalysis, Routine w reflex microscopic Urine, Clean Catch  . Insert peripheral IV   Meds ordered this encounter  Medications  . lactated ringers bolus 1,000 mL  . cyclobenzaprine (FLEXERIL) tablet 10 mg  . acetaminophen (TYLENOL) tablet 1,000 mg   Reassessment after treatments in MAU - patient reports that abdominal pain is now 2/10  Educated and discussed with patient RLP and reasons to present to MAU. Patient reports that she was wanting to walk to mother's house after argument with FOB at her house. Discussed with patient to not walk that far while pregnant, alone and at night unless she is in danger. Patient verbalizes understanding.   Discussed reasons to return to MAU. Follow up as scheduled in the office. Return to MAU as needed. Pt stable at time of discharge.   Assessment and Plan   1. Pain of round ligament during pregnancy   2. Dehydration during pregnancy   3. [redacted] weeks gestation of pregnancy    Discharge home Follow up as scheduled in the office for prenatal care Return to MAU as needed for reasons discussed and/or emergencies    Follow-up [place] for [place] [facility] Follow up.   Specialty: Obstetrics and  Gynecology Contact information: [place] [phone]             Allergies as of 07/04/2020   No Known Allergies     Medication List    STOP taking these medications   nitrofurantoin (macrocrystal-monohydrate) 100 MG capsule Commonly known as: MACROBID     TAKE these medications   aspirin EC 81 MG  tablet Take 1 tablet (81 mg total) by mouth daily. Take after 12 weeks for prevention of preeclampsia later in pregnancy   Blood Pressure Kit Devi 1 Device by Does not apply route once a week. To be monitored weekly from home   Concept OB 130-92.4-1 MG Caps Take 1 tablet by mouth daily.   Doxylamine-Pyridoxine 10-10 MG Tbec Commonly known as: Diclegis Take 2 tablets by mouth at bedtime.   famotidine 20 MG tablet Commonly known as: PEPCID Take 1 tablet (20 mg total) by mouth 2 (two) times daily.   Vitafol Gummies 3.33-0.333-34.8 MG Chew Chew 1 tablet by mouth daily.       Lajean Manes 07/04/2020, 6:10 AM

## 2020-07-04 DIAGNOSIS — O99282 Endocrine, nutritional and metabolic diseases complicating pregnancy, second trimester: Secondary | ICD-10-CM | POA: Diagnosis not present

## 2020-07-04 DIAGNOSIS — O26892 Other specified pregnancy related conditions, second trimester: Secondary | ICD-10-CM | POA: Diagnosis not present

## 2020-07-04 DIAGNOSIS — E860 Dehydration: Secondary | ICD-10-CM | POA: Diagnosis not present

## 2020-07-04 DIAGNOSIS — Z3A16 16 weeks gestation of pregnancy: Secondary | ICD-10-CM | POA: Diagnosis not present

## 2020-07-04 DIAGNOSIS — R1030 Lower abdominal pain, unspecified: Secondary | ICD-10-CM | POA: Diagnosis not present

## 2020-07-04 LAB — URINALYSIS, ROUTINE W REFLEX MICROSCOPIC
Bilirubin Urine: NEGATIVE
Glucose, UA: NEGATIVE mg/dL
Hgb urine dipstick: NEGATIVE
Ketones, ur: 20 mg/dL — AB
Leukocytes,Ua: NEGATIVE
Nitrite: NEGATIVE
Protein, ur: NEGATIVE mg/dL
Specific Gravity, Urine: 1.006 (ref 1.005–1.030)
pH: 7 (ref 5.0–8.0)

## 2020-07-14 ENCOUNTER — Ambulatory Visit (INDEPENDENT_AMBULATORY_CARE_PROVIDER_SITE_OTHER): Payer: Medicaid Other | Admitting: Obstetrics and Gynecology

## 2020-07-14 ENCOUNTER — Other Ambulatory Visit: Payer: Self-pay

## 2020-07-14 VITALS — BP 113/71 | HR 94 | Wt 128.0 lb

## 2020-07-14 DIAGNOSIS — R87610 Atypical squamous cells of undetermined significance on cytologic smear of cervix (ASC-US): Secondary | ICD-10-CM

## 2020-07-14 DIAGNOSIS — Z3480 Encounter for supervision of other normal pregnancy, unspecified trimester: Secondary | ICD-10-CM

## 2020-07-14 DIAGNOSIS — Z3A18 18 weeks gestation of pregnancy: Secondary | ICD-10-CM

## 2020-07-14 DIAGNOSIS — R87810 Cervical high risk human papillomavirus (HPV) DNA test positive: Secondary | ICD-10-CM

## 2020-07-14 DIAGNOSIS — R768 Other specified abnormal immunological findings in serum: Secondary | ICD-10-CM

## 2020-07-14 DIAGNOSIS — O09299 Supervision of pregnancy with other poor reproductive or obstetric history, unspecified trimester: Secondary | ICD-10-CM

## 2020-07-14 DIAGNOSIS — O2341 Unspecified infection of urinary tract in pregnancy, first trimester: Secondary | ICD-10-CM

## 2020-07-14 DIAGNOSIS — O34219 Maternal care for unspecified type scar from previous cesarean delivery: Secondary | ICD-10-CM

## 2020-07-14 NOTE — Progress Notes (Signed)
Prenatal Visit Note Date: 07/14/2020 Clinic: [facility]  Subjective:  Alexis Blanchard is a 25 y.o. [phone] at [redacted]w[redacted]d being seen today for ongoing prenatal care.  She is currently monitored for the following issues for this low-risk pregnancy and has Alpha thalassemia silent carrier; History of severe preeclampsia in prior pregnancy needing delivery at 34 weeks; Previous cesarean delivery for breech at 34 weeks for severe preeclampsia, antepartum; Supervision of other normal pregnancy, antepartum; UTI in pregnancy, first trimester; False positive RPR test; ASCUS with positive high risk HPV cervical on 05/20/2020; and COVID-19 affecting pregnancy in first trimester on their problem list.  Patient reports no complaints.   Contractions: Not present. Vag. Bleeding: None.  Movement: Present. Denies leaking of fluid.   The following portions of the patient's history were reviewed and updated as appropriate: allergies, current medications, past family history, past medical history, past social history, past surgical history and problem list. Problem list updated.  Objective:   Vitals:   07/14/20 1538  BP: 113/71  Pulse: 94  Weight: 128 lb (58.1 kg)    Fetal Status: Fetal Heart Rate (bpm): 156   Movement: Present     General:  Alert, oriented and cooperative. Patient is in no acute distress.  Skin: Skin is warm and dry. No rash noted.   Cardiovascular: Normal heart rate noted  Respiratory: Normal respiratory effort, no problems with respiration noted  Abdomen: Soft, gravid, appropriate for gestational age. Pain/Pressure: Absent     Pelvic:  Cervical exam deferred        Extremities: Normal range of motion.     Mental Status: Normal mood and affect. Normal behavior. Normal judgment and thought content.   Urinalysis:      Assessment and Plan:  Pregnancy: [id number] at [redacted]w[redacted]d  1. UTI in pregnancy, first trimester toc nv  2. History of severe preeclampsia in prior pregnancy  needing delivery at 34 weeks Continue low dose asa  3. Previous cesarean delivery for breech at 34 weeks for severe preeclampsia, antepartum PLTCS. D/w her later in pregnancy re: delivery route  4. False positive RPR test Rpt at 28wks  5. Supervision of other normal pregnancy, antepartum Anatomy u/s already scheduled.  - AFP, Serum, Open Spina Bifida   Preterm labor symptoms and general obstetric precautions including but not limited to vaginal bleeding, contractions, leaking of fluid and fetal movement were reviewed in detail with the patient. Please refer to After Visit Summary for other counseling recommendations.  Return in about 3 weeks (around 08/04/2020) for in person, md or app.   Ocheyedan Bing, MD

## 2020-07-16 LAB — AFP, SERUM, OPEN SPINA BIFIDA
AFP MoM: 1.52
AFP Value: 82.7 ng/mL
Gest. Age on Collection Date: 18.1 weeks
Maternal Age At EDD: 23.5 yr
OSBR Risk 1 IN: 5182
Test Results:: NEGATIVE
Weight: 128 [lb_av]

## 2020-07-20 ENCOUNTER — Other Ambulatory Visit: Payer: Self-pay | Admitting: *Deleted

## 2020-07-20 ENCOUNTER — Other Ambulatory Visit: Payer: Self-pay

## 2020-07-20 ENCOUNTER — Other Ambulatory Visit: Payer: Self-pay | Admitting: Obstetrics & Gynecology

## 2020-07-20 ENCOUNTER — Ambulatory Visit: Payer: Medicaid Other | Attending: Obstetrics & Gynecology

## 2020-07-20 DIAGNOSIS — Z362 Encounter for other antenatal screening follow-up: Secondary | ICD-10-CM

## 2020-07-20 DIAGNOSIS — Z3480 Encounter for supervision of other normal pregnancy, unspecified trimester: Secondary | ICD-10-CM

## 2020-08-02 ENCOUNTER — Encounter (HOSPITAL_COMMUNITY): Payer: Self-pay | Admitting: Obstetrics & Gynecology

## 2020-08-02 ENCOUNTER — Inpatient Hospital Stay (HOSPITAL_COMMUNITY)
Admission: AD | Admit: 2020-08-02 | Discharge: 2020-08-03 | Disposition: A | Discharge status: Home / Self Care | Payer: Medicaid Other | Attending: Obstetrics & Gynecology | Admitting: Obstetrics & Gynecology

## 2020-08-02 DIAGNOSIS — Z3689 Encounter for other specified antenatal screening: Secondary | ICD-10-CM

## 2020-08-02 DIAGNOSIS — Z3492 Encounter for supervision of normal pregnancy, unspecified, second trimester: Secondary | ICD-10-CM

## 2020-08-02 DIAGNOSIS — O26892 Other specified pregnancy related conditions, second trimester: Secondary | ICD-10-CM | POA: Diagnosis not present

## 2020-08-02 DIAGNOSIS — Z7982 Long term (current) use of aspirin: Secondary | ICD-10-CM | POA: Insufficient documentation

## 2020-08-02 DIAGNOSIS — Z3A20 20 weeks gestation of pregnancy: Secondary | ICD-10-CM

## 2020-08-02 DIAGNOSIS — R519 Headache, unspecified: Secondary | ICD-10-CM

## 2020-08-02 MED ORDER — ACETAMINOPHEN 500 MG PO TABS
1000.0000 mg | ORAL_TABLET | Freq: Once | ORAL | Status: AC
  Administered 2020-08-02: 1000 mg via ORAL
  Filled 2020-08-02: qty 2

## 2020-08-02 MED ORDER — PROMETHAZINE HCL 25 MG PO TABS: 25.0000 mg | ORAL_TABLET | Freq: Four times a day (QID) | ORAL | 0 refills | Status: DC | PRN

## 2020-08-02 NOTE — MAU Provider Note (Addendum)
History     CSN: [id number]  Arrival date and time: 08/02/20 2134   Event Date/Time   First Provider Initiated Contact with Patient 08/02/20 2210      Chief Complaint  Patient presents with  . no moverment, headache, left top pain   HPI Alexis Blanchard is a 25 y.o. [phone] at 67w6dwho presents with decreased fetal movement, headache, and shoulder pain. She states her son had a cold this week which she feels like she now has. She states she laid in the bed and rested for several day and for the last 3 days she has had a headache and left shoulder pain. She rates the pain a 6/10 and has not tried anything for the pain. She has drank water today but has not eaten.  She also reports decreased fetal movement although when questioned, she reports she is unsure if she has felt the baby move yet. She was not feeling regular fetal movement before today. She reports being worried about the baby.   OB History    Gravida  4   Para  1   Term  0   Preterm  1   AB  2   Living  1     SAB  2   IAB  0   Ectopic  0   Multiple  0   Live Births  1           Past Medical History:  Diagnosis Date  . Severe preeclampsia    Delivered at 34 weeks  . Syphilis     Past Surgical History:  Procedure Laterality Date  . CESAREAN SECTION N/A 06/08/2017   Procedure: CESAREAN SECTION;  Surgeon: HLavonia Drafts MD;  [place] [facility]  Service: Obstetrics;  Laterality: N/A;  . TONSILLECTOMY      Family History  Problem Relation Age of Onset  . Hypertension Mother   . Heart disease Maternal Aunt        CHF  . Diabetes Maternal Grandmother   . Hypertension Maternal Grandmother   . Heart disease Maternal Grandmother   . Cancer Maternal Aunt 45       colon  . Cancer Maternal Aunt 45       throat  . Congestive Heart Failure Maternal Uncle     Social History   Tobacco Use  . Smoking status: Never Smoker  . Smokeless tobacco: Never Used  Vaping Use  .  Vaping Use: Never used  Substance Use Topics  . Alcohol use: Not Currently    Comment: pt states that she has not used since finding out about the pregnancy  . Drug use: Not Currently    Types: Marijuana    Comment: pt states that she has not used since finding out about the pregnancy    Allergies: No Known Allergies  Medications Prior to Admission  Medication Sig Dispense Refill Last Dose  . Prenatal Vit-Fe Phos-FA-Omega (VITAFOL GUMMIES) 3.33-0.333-34.8 MG CHEW Chew 1 tablet by mouth daily. 90 tablet 5 08/02/2020 at Unknown time  . aspirin EC 81 MG tablet Take 1 tablet (81 mg total) by mouth daily. Take after 12 weeks for prevention of preeclampsia later in pregnancy 300 tablet 2   . Blood Pressure Monitoring (BLOOD PRESSURE KIT) DEVI 1 Device by Does not apply route once a week. To be monitored weekly from home 1 each 0   . Doxylamine-Pyridoxine (DICLEGIS) 10-10 MG TBEC Take 2 tablets by mouth at bedtime. 60 tablet 0   .  famotidine (PEPCID) 20 MG tablet Take 1 tablet (20 mg total) by mouth 2 (two) times daily. 60 tablet 0   . Prenat w/o A Vit-FeFum-FePo-FA (CONCEPT OB) 130-92.4-1 MG CAPS Take 1 tablet by mouth daily. 30 capsule 12     Review of Systems  Constitutional: Negative.  Negative for fatigue and fever.  HENT: Negative.   Respiratory: Negative.  Negative for shortness of breath.   Cardiovascular: Negative.  Negative for chest pain.  Gastrointestinal: Negative.  Negative for abdominal pain, constipation, diarrhea, nausea and vomiting.  Genitourinary: Negative.  Negative for dysuria.  Musculoskeletal:       Shoulder pain  Neurological: Positive for headaches. Negative for dizziness.   Physical Exam   Last menstrual period 03/09/2020.  Physical Exam Vitals and nursing note reviewed.  Constitutional:      General: She is not in acute distress.    Appearance: She is well-developed.  HENT:     Head: Normocephalic.  Eyes:     Pupils: Pupils are equal, round, and  reactive to light.  Cardiovascular:     Rate and Rhythm: Normal rate and regular rhythm.     Heart sounds: Normal heart sounds.  Pulmonary:     Effort: Pulmonary effort is normal. No respiratory distress.     Breath sounds: Normal breath sounds.  Abdominal:     General: Bowel sounds are normal. There is no distension.     Palpations: Abdomen is soft.     Tenderness: There is no abdominal tenderness.  Skin:    General: Skin is warm and dry.  Neurological:     Mental Status: She is alert and oriented to person, place, and time.  Psychiatric:        Behavior: Behavior normal.        Thought Content: Thought content normal.        Judgment: Judgment normal.     FHT: 155 bpm  MAU Course  Procedures    MDM UA- patient declined to leave sample Tylenol PO  Reassurance provided of normalcy of less movement at this gestational age and reviewed expectations for movement at length.   After discharge, patient reports one episode of unwitnessed vomiting before leaving the room. Requested nausea medication for home use.   Assessment and Plan   1. Pregnancy headache in second trimester   2. [redacted] weeks gestation of pregnancy   3. Presence of fetal heart sounds in second trimester    -Discharge home in stable condition -RX for phenergan sent to patient's pharmacy -Second trimester precautions discussed -Patient advised to follow-up with OB as scheduled for prenatal care -Patient may return to MAU as needed or if her condition were to change or worsen   Pringle 08/02/2020, 10:10 PM

## 2020-08-02 NOTE — Discharge Instructions (Signed)

## 2020-08-02 NOTE — MAU Note (Signed)
Pt presents to [facility] c/o decreased fetal movement and upper back pain over the last three days.  Pt denies LOF or vaginal bleeding.  Pt thinks the last time she felt the baby move was yesterday am.

## 2020-08-03 DIAGNOSIS — R519 Headache, unspecified: Secondary | ICD-10-CM | POA: Diagnosis not present

## 2020-08-03 DIAGNOSIS — Z3A20 20 weeks gestation of pregnancy: Secondary | ICD-10-CM | POA: Diagnosis not present

## 2020-08-03 DIAGNOSIS — Z7982 Long term (current) use of aspirin: Secondary | ICD-10-CM | POA: Diagnosis not present

## 2020-08-03 DIAGNOSIS — Z3689 Encounter for other specified antenatal screening: Secondary | ICD-10-CM | POA: Diagnosis not present

## 2020-08-03 DIAGNOSIS — O26892 Other specified pregnancy related conditions, second trimester: Secondary | ICD-10-CM | POA: Diagnosis not present

## 2020-08-03 NOTE — MAU Note (Signed)
RN entered room to discharge pt and pt states she has been feeling nauseous for the last few weeks and vomiting 2-3 times per day.  Pt states she just vomited in the bathroom a few minutes ago, no blood in the vomit per pt. CNM made aware.

## 2020-08-06 ENCOUNTER — Ambulatory Visit: Payer: Self-pay | Admitting: *Deleted

## 2020-08-06 NOTE — Telephone Encounter (Signed)
Pt reports eye pain, corner of right eye. Denies swelling, states "Little red on white of eye like it's irritated." No injury, does not see foreign body. Reports painful 7/10. Also reports 3/10 headache. States BP "Normal."Home care advise given. If headache persists advised to call OB/GYN. Pt verbalizes understanding.  Reason for Disposition . [1] Mild eye pain AND [2] present < 24 hours  Answer Assessment - Initial Assessment Questions 1. ONSET: "When did the pain start?" (e.g., minutes, hours, days)     This am 2. TIMING: "Does the pain come and go, or has it been constant since it started?" (e.g., constant, intermittent, fleeting)     Constant 3. SEVERITY: "How bad is the pain?"   (Scale 1-10; mild, moderate or severe)   - MILD (1-3): doesn't interfere with normal activities    - MODERATE (4-7): interferes with normal activities or awakens from sleep    - SEVERE (8-10): excruciating pain and patient unable to do normal activities     7/10 4. LOCATION: "Where does it hurt?"  (e.g., eyelid, eye, cheekbone)     *No Answer* 5. CAUSE: "What do you think is causing the pain?"     Unsure 6. VISION: "Do you have blurred vision or changes in your vision?"     no 7. EYE DISCHARGE: "Is there any discharge (pus) from the eye(s)?"  If yes, ask: "What color is it?"      no 8. FEVER: "Do you have a fever?" If Yes, ask: "What is it, how was it measured, and when did it start?"      no 9. OTHER SYMPTOMS: "Do you have any other symptoms?" (e.g., headache, nasal discharge, facial rash)     Headache  3/10. Sclera a little red 10. PREGNANCY: "Is there any chance you are pregnant?" "When was your last menstrual period?"  Protocols used: EYE PAIN-A-AH

## 2020-08-11 ENCOUNTER — Other Ambulatory Visit: Payer: Self-pay

## 2020-08-11 ENCOUNTER — Ambulatory Visit (INDEPENDENT_AMBULATORY_CARE_PROVIDER_SITE_OTHER): Payer: Medicaid Other | Admitting: Obstetrics & Gynecology

## 2020-08-11 VITALS — BP 130/79 | HR 97 | Wt 133.8 lb

## 2020-08-11 DIAGNOSIS — Z3480 Encounter for supervision of other normal pregnancy, unspecified trimester: Secondary | ICD-10-CM

## 2020-08-11 DIAGNOSIS — O2340 Unspecified infection of urinary tract in pregnancy, unspecified trimester: Secondary | ICD-10-CM

## 2020-08-11 DIAGNOSIS — O09299 Supervision of pregnancy with other poor reproductive or obstetric history, unspecified trimester: Secondary | ICD-10-CM

## 2020-08-11 DIAGNOSIS — Z3A22 22 weeks gestation of pregnancy: Secondary | ICD-10-CM

## 2020-08-11 DIAGNOSIS — O34219 Maternal care for unspecified type scar from previous cesarean delivery: Secondary | ICD-10-CM

## 2020-08-11 NOTE — Patient Instructions (Signed)
Vaginal Birth After Cesarean Delivery  Vaginal birth after cesarean delivery (VBAC) is giving birth vaginally after previously delivering a baby through a cesarean section (C-section). A VBAC may be a safe option for you, depending on your health and other factors. It is important to discuss VBAC with your health care provider early in your pregnancy so you can understand the risks, benefits, and options. Having these discussions early will give you time to make your birth plan. Who are the best candidates for VBAC? The best candidates for VBAC are women who:  Have had one or two prior cesarean deliveries, and the incision made during the delivery was horizontal (low transverse).  Do not have a vertical (classical) scar on their uterus.  Have not had a tear in the wall of their uterus (uterine rupture).  Plan to have more pregnancies. A VBAC is also more likely to be successful:  In women who have previously given birth vaginally.  When labor starts by itself (spontaneously) before the due date. What are the benefits of VBAC? The benefits of delivering your baby vaginally instead of by a cesarean delivery include:  A shorter hospital stay.  A faster recovery time.  Less pain.  Avoiding risks associated with major surgery, such as infection and blood clots.  Less blood loss and less need for donated blood (transfusions). What are the risks of VBAC? The main risk of attempting a VBAC is that it may fail, forcing your health care provider to deliver your baby by a C-section. Other risks are rare and include:  Tearing (rupture) of the scar from a past cesarean delivery.  Other risks associated with vaginal deliveries. If a repeat cesarean delivery is needed, the risks include:  Blood loss.  Infection.  Blood clot.  Damage to surrounding organs.  Removal of the uterus (hysterectomy), if it is damaged.  Placenta problems in future pregnancies. What else should I know  about my options? Delivering a baby through a VBAC is similar to having a normal spontaneous vaginal delivery. Therefore, it is safe:  To try with twins.  For your health care provider to try to turn the baby from a breech position (external cephalic version) during labor.  With epidural analgesia for pain relief. Consider where you would like to deliver your baby. VBAC should be attempted in facilities where an emergency cesarean delivery can be performed. VBAC is not recommended for home births. Any changes in your health or your baby's health during your pregnancy may make it necessary to change your initial decision about VBAC. Your health care provider may recommend that you do not attempt a VBAC if:  Your baby's suspected weight is 8.8 lb (4 kg) or more.  You have preeclampsia. This is a condition that causes high blood pressure along with other symptoms, such as swelling and headaches.  You will have VBAC less than 19 months after your cesarean delivery.  You are past your due date.  You need to have labor started (induced) because your cervix is not ready for labor (unfavorable). Where to find more information  American Pregnancy Association: americanpregnancy.org  American Congress of Obstetricians and Gynecologists: acog.org Summary  Vaginal birth after cesarean delivery (VBAC) is giving birth vaginally after previously delivering a baby through a cesarean section (C-section). A VBAC may be a safe option for you, depending on your health and other factors.  Discuss VBAC with your health care provider early in your pregnancy so you can understand the risks, benefits, options, and   have plenty of time to make your birth plan.  The main risk of attempting a VBAC is that it may fail, forcing your health care provider to deliver your baby by a C-section. Other risks are rare. This information is not intended to replace advice given to you by your health care provider. Make sure  you discuss any questions you have with your health care provider. Document Revised: 08/21/2018 Document Reviewed: 08/02/2016 Elsevier Patient Education  2021 Elsevier Inc.  

## 2020-08-11 NOTE — Progress Notes (Signed)
PRENATAL VISIT NOTE  Subjective:  Alexis Blanchard is a 25 y.o. [phone] at [redacted]w[redacted]d being seen today for ongoing prenatal care.  She is currently monitored for the following issues for this low-risk pregnancy and has Alpha thalassemia silent carrier; History of severe preeclampsia in prior pregnancy needing delivery at 34 weeks; Previous low transverse cesarean delivery for breech at 34 weeks for severe preeclampsia, antepartum; Supervision of other normal pregnancy, antepartum; UTI in pregnancy, first trimester; False positive RPR test; ASCUS with positive high risk HPV cervical on 05/20/2020; and COVID-19 affecting pregnancy in first trimester on their problem list.  Patient reports no complaints.  Contractions: Not present. Vag. Bleeding: None.  Movement: Present. Denies leaking of fluid.   The following portions of the patient's history were reviewed and updated as appropriate: allergies, current medications, past family history, past medical history, past social history, past surgical history and problem list.   Objective:   Vitals:   08/11/20 1553  BP: 130/79  Pulse: 97  Weight: 133 lb 12.8 oz (60.7 kg)    Fetal Status: Fetal Heart Rate (bpm): 153   Movement: Present     General:  Alert, oriented and cooperative. Patient is in no acute distress.  Skin: Skin is warm and dry. No rash noted.   Cardiovascular: Normal heart rate noted  Respiratory: Normal respiratory effort, no problems with respiration noted  Abdomen: Soft, gravid, appropriate for gestational age.  Pain/Pressure: Absent     Pelvic: Cervical exam deferred        Extremities: Normal range of motion.     Mental Status: Normal mood and affect. Normal behavior. Normal judgment and thought content.   [place] MFM OB DETAIL +14 WK  Result Date: 07/20/2020 ----------------------------------------------------------------------  OBSTETRICS REPORT                       (Signed Final 07/20/2020 05:16 pm)  ---------------------------------------------------------------------- Patient Info  ID #:       [id number]                          D.O.B.:  1995-07-05 (25 yrs)  Name:       Alexis Blanchard                Visit Date: 07/20/2020 02:52 pm ---------------------------------------------------------------------- Performed By  Attending:        Noralee Space MD        Ref. Address:     [address]                                                             [place], [place]  [address]  Performed By:     Hurman Horn,         [facility]  Referred By:      Tereso Newcomer MD ---------------------------------------------------------------------- Orders  #  Description                           Code        Ordered By  1  [place] MFM OB DETAIL +14 WK               76811.01    Jaynie Collins ----------------------------------------------------------------------  #  Order #                     Accession #                Episode #  1  [id number]                   [id number]                 [id number] ---------------------------------------------------------------------- Indications  [redacted] weeks gestation of pregnancy                Z3A.19  Poor obstetric history: Previous               O09.299  preeclampsia / eclampsia/gestational HTN  Poor obstetric history: Previous preterm       O09.219  delivery, antepartum  Abnormal chromosomal and genetic finding       O28.5  on antenatal screening of mother (alpha  thalassemia silent carrier)  Previous C-section  Encounter for antenatal screening for          Z36.3  malformations (low risk Nips, neg AFP)  Abnormal fetal  ultrasound (increased NFT)      O28.9 ---------------------------------------------------------------------- Fetal Evaluation  Num Of Fetuses:         1  Fetal Heart Rate(bpm):  153  Cardiac Activity:       Observed  Presentation:           Cephalic  Placenta:               Anterior  P. Cord Insertion:      Visualized, central  Amniotic Fluid  AFI FV:      Within normal limits                              Largest Pocket(cm)                              4.56 ---------------------------------------------------------------------- Biometry  BPD:      44.3  mm     G. Age:  19w 3d         68  %    CI:        72.56   %    70 - 86                                                          FL/HC:      18.9   %    16.1 - 18.3  HC:      165.4  mm     G. Age:  19w 2d         55  %    HC/AC:      1.17        1.09 - 1.39  AC:      141.9  mm     G. Age:  19w 4d         65  %    FL/BPD:     70.4   %  FL:       31.2  mm     G. Age:  19w 5d         69  %    FL/AC:      22.0   %    20 - 24  HUM:      29.5  mm     G. Age:  19w 5d         67  %  CER:      20.5  mm     G. Age:  19w 5d         67  %  NFT:       6.7  mm  LV:        9.5  mm  CM:        4.7  mm  Est. FW:     300  gm    0 lb 11 oz      79  % ---------------------------------------------------------------------- OB History  Gravidity:    4         Prem:   1         SAB:   2  Living:       1 ---------------------------------------------------------------------- Gestational Age  U/S Today:     19w 4d                                        EDD:   12/10/20  Best:          19w 0d     Det. By:  Previous Ultrasound      EDD:   12/14/20                                      (  05/16/20) ---------------------------------------------------------------------- Anatomy  Cranium:               Appears normal         LVOT:                   Appears normal  Cavum:                 Appears normal         Aortic Arch:            Appears normal  Ventricles:            Appears normal         Ductal  Arch:            Appears normal  Choroid Plexus:        Appears normal         Diaphragm:              Appears normal  Cerebellum:            Appears normal         Stomach:                Appears normal, left                                                                        sided  Posterior Fossa:       Appears normal         Abdomen:                Appears normal  Nuchal Fold:           Appears enlarged,      Abdominal Wall:         Appears nml (cord                         6.7 mm                                                                        insert, abd wall)  Face:                  Appears normal         Cord Vessels:           Appears normal (3                         (orbits and profile)                           vessel cord)  Lips:                  Appears normal         Kidneys:                Appear normal  Palate:  Appears normal         Bladder:                Appears normal  Thoracic:              Appears normal         Spine:                  Not well visualized  Heart:                 Appears normal         Upper Extremities:      Appears normal                         (4CH, axis, and                         situs)  RVOT:                  Appears normal         Lower Extremities:      Appears normal  Other:  Fetus appears to be a female. Heels visualized. Nasal bone visualized. ---------------------------------------------------------------------- Cervix Uterus Adnexa  Cervix  Length:           3.53  cm.  Normal appearance by transabdominal scan.  Uterus  No abnormality visualized.  Right Ovary  Not visualized.  Left Ovary  Not visualized. ---------------------------------------------------------------------- Impression  G4 P1. Patient is here for fetal anatomy scan.  On cell-free fetal DNA screening, the risks of aneuploidies  were not increased. MSAFP screening showed low risk for  open-neural tube defects.  We performed a fetal anatomy scan. The nuchal-fold  thickness is  increased. No other markers of aneuploidies or  fetal structural defects are seen. Fetal biometry is consistent  with her previously-established dates. Amniotic fluid is normal  and good fetal activity is seen  I counseled the patient that the nuchal fold thickness is  associated with an increased risk for Down syndrome.  However, given that she had low risk for Down syndrome on  cell free fetal DNA screening, the likelihood of Down  syndrome is very low.  I informed the patient that only amniocentesis will give a  definitive result on the fetal karyotype.  Patient understands the limitations of ultrasound in detecting  fetal anomalies and opted not to have amniocentesis. ---------------------------------------------------------------------- Recommendations  -An appointment was made for her to return in 4 weeks for  completion of fetal anatomy (fetal spine). ----------------------------------------------------------------------                  Noralee Spaceavi Shankar, MD Electronically Signed Final Report   07/20/2020 05:16 pm ----------------------------------------------------------------------   Assessment and Plan:  Pregnancy: [id number] at 5811w1d 1. History of severe preeclampsia in prior pregnancy needing delivery at 34 weeks Normal BP, continue to monitor closely  2. Previous low transverse cesarean delivery for breech at 34 weeks for severe preeclampsia, antepartum Counseled regarding TOLAC vs RCS; risks/benefits discussed.  Consent given to her to review, will sign next visit.    3. Urinary tract infection in mother during pregnancy, antepartum Needs TOC from E.coli - Culture, OB Urine  4. [redacted] weeks gestation of pregnancy 5. Supervision of other normal pregnancy, antepartum Follow up anatomy scan scheduled. Preterm labor symptoms and general obstetric precautions including but not limited to vaginal bleeding, contractions, leaking of fluid and fetal  movement were reviewed in detail with the  patient. Please refer to After Visit Summary for other counseling recommendations.   Return in about 3 weeks (around 09/01/2020) for OFFICE OB VISIT (MD)  6 weeks from now: 2 hr GTT, 3rd trimester labs, TDap, OFFICE OB VISIT (MD).  Future Appointments  Date Time Provider Department Center  08/18/2020  2:45 PM [facility] NURSE [facility]  08/18/2020  3:00 PM [facility] US1 [facility]  09/01/2020  1:00 PM Kada Friesen, Jethro Bastos, MD [facility]  09/22/2020  8:45 AM Termaine Roupp, Jethro Bastos, MD [facility]    Jaynie Collins, MD

## 2020-08-14 LAB — URINE CULTURE, OB REFLEX

## 2020-08-14 LAB — CULTURE, OB URINE

## 2020-08-17 ENCOUNTER — Encounter: Payer: Self-pay | Admitting: Obstetrics & Gynecology

## 2020-08-18 ENCOUNTER — Ambulatory Visit: Payer: Medicaid Other | Admitting: *Deleted

## 2020-08-18 ENCOUNTER — Ambulatory Visit: Payer: Medicaid Other | Attending: Obstetrics and Gynecology

## 2020-08-18 ENCOUNTER — Encounter: Payer: Self-pay | Admitting: *Deleted

## 2020-08-18 ENCOUNTER — Other Ambulatory Visit: Payer: Self-pay

## 2020-08-18 DIAGNOSIS — O09299 Supervision of pregnancy with other poor reproductive or obstetric history, unspecified trimester: Secondary | ICD-10-CM | POA: Diagnosis not present

## 2020-08-18 DIAGNOSIS — U071 COVID-19: Secondary | ICD-10-CM

## 2020-08-18 DIAGNOSIS — O34219 Maternal care for unspecified type scar from previous cesarean delivery: Secondary | ICD-10-CM | POA: Diagnosis not present

## 2020-08-18 DIAGNOSIS — R87810 Cervical high risk human papillomavirus (HPV) DNA test positive: Secondary | ICD-10-CM

## 2020-08-18 DIAGNOSIS — R768 Other specified abnormal immunological findings in serum: Secondary | ICD-10-CM | POA: Insufficient documentation

## 2020-08-18 DIAGNOSIS — O2341 Unspecified infection of urinary tract in pregnancy, first trimester: Secondary | ICD-10-CM | POA: Insufficient documentation

## 2020-08-18 DIAGNOSIS — Z362 Encounter for other antenatal screening follow-up: Secondary | ICD-10-CM

## 2020-08-18 DIAGNOSIS — O98511 Other viral diseases complicating pregnancy, first trimester: Secondary | ICD-10-CM | POA: Insufficient documentation

## 2020-08-18 DIAGNOSIS — R87610 Atypical squamous cells of undetermined significance on cytologic smear of cervix (ASC-US): Secondary | ICD-10-CM | POA: Diagnosis not present

## 2020-08-18 DIAGNOSIS — Z3480 Encounter for supervision of other normal pregnancy, unspecified trimester: Secondary | ICD-10-CM | POA: Diagnosis not present

## 2020-08-19 ENCOUNTER — Other Ambulatory Visit: Payer: Self-pay

## 2020-08-19 DIAGNOSIS — Z3480 Encounter for supervision of other normal pregnancy, unspecified trimester: Secondary | ICD-10-CM

## 2020-08-19 MED ORDER — CONCEPT OB 130-92.4-1 MG PO CAPS: 1.0000 | ORAL_CAPSULE | Freq: Every day | ORAL | 12 refills | Status: DC

## 2020-08-20 ENCOUNTER — Other Ambulatory Visit: Payer: Self-pay

## 2020-08-20 DIAGNOSIS — Z3480 Encounter for supervision of other normal pregnancy, unspecified trimester: Secondary | ICD-10-CM

## 2020-08-20 MED ORDER — PREPLUS 27-1 MG PO TABS: 1.0000 | ORAL_TABLET | Freq: Every day | ORAL | 13 refills | Status: DC

## 2020-08-27 ENCOUNTER — Telehealth: Payer: Self-pay | Admitting: Obstetrics & Gynecology

## 2020-08-27 NOTE — Telephone Encounter (Signed)
     Faculty Practice OB/GYN Physician Phone Call Documentation  I received a phone call from [employer] about elevated BP of 140/85 and reported nausea for [employer] . I called patient, there was no answer. A voicemail was left for her to recheck her BP and to come in to [facility] if still elevated and/or for worsening symptoms.  Message also sent via MyChart.    Jaynie Collins, MD, FACOG Obstetrician & Gynecologist, [facility] for [employer], [facility]

## 2020-09-01 ENCOUNTER — Encounter: Payer: Self-pay | Admitting: Obstetrics & Gynecology

## 2020-09-01 ENCOUNTER — Ambulatory Visit (INDEPENDENT_AMBULATORY_CARE_PROVIDER_SITE_OTHER): Payer: Medicaid Other | Admitting: Obstetrics & Gynecology

## 2020-09-01 ENCOUNTER — Other Ambulatory Visit: Payer: Self-pay

## 2020-09-01 VITALS — BP 117/70 | HR 92 | Wt 136.8 lb

## 2020-09-01 DIAGNOSIS — Z3A25 25 weeks gestation of pregnancy: Secondary | ICD-10-CM

## 2020-09-01 DIAGNOSIS — O09299 Supervision of pregnancy with other poor reproductive or obstetric history, unspecified trimester: Secondary | ICD-10-CM

## 2020-09-01 DIAGNOSIS — Z3480 Encounter for supervision of other normal pregnancy, unspecified trimester: Secondary | ICD-10-CM

## 2020-09-01 NOTE — Patient Instructions (Addendum)
Return to office for any scheduled appointments. Call the office or go to the MAU at [facility] if:  You begin to have strong, frequent contractions  Your water breaks.  Sometimes it is a big gush of fluid, sometimes it is just a trickle that keeps getting your panties wet or running down your legs  You have vaginal bleeding.  It is normal to have a small amount of spotting if your cervix was checked.   You do not feel your baby moving like normal.  If you do not, get something to eat and drink and lay down and focus on feeling your baby move.   If your baby is still not moving like normal, you should call the office or go to MAU.  Any other obstetric concerns.  TDaP Vaccine Pregnancy Get the Whooping Cough Vaccine While You Are Pregnant (CDC)  It is important for women to get the whooping cough vaccine in the third trimester of each pregnancy. Vaccines are the best way to prevent this disease. There are 2 different whooping cough vaccines. Both vaccines combine protection against whooping cough, tetanus and diphtheria, but they are for different age groups: Tdap: for everyone 11 years or older, including pregnant women  DTaP: for children 2 months through 6 years of age  You need the whooping cough vaccine during each of your pregnancies The recommended time to get the shot is during your 27th through 36th week of pregnancy, preferably during the earlier part of this time period. The Centers for Disease Control and Prevention (CDC) recommends that pregnant women receive the whooping cough vaccine for adolescents and adults (called Tdap vaccine) during the third trimester of each pregnancy. The recommended time to get the shot is during your 27th through 36th week of pregnancy, preferably during the earlier part of this time period. This replaces the original recommendation that pregnant women get the vaccine only if they had not previously received it. The  American College of Obstetricians and Gynecologists and the American College of Nurse-Midwives support this recommendation.  You should get the whooping cough vaccine while pregnant to pass protection to your baby frame support disabled and/or not supported in this browser  Learn why Alexis Blanchard decided to get the whooping cough vaccine in her 3rd trimester of pregnancy and how her baby girl was born with some protection against the disease. Also available on YouTube. After receiving the whooping cough vaccine, your body will create protective antibodies (proteins produced by the body to fight off diseases) and pass some of them to your baby before birth. These antibodies provide your baby some short-term protection against whooping cough in early life. These antibodies can also protect your baby from some of the more serious complications that come along with whooping cough. Your protective antibodies are at their highest about 2 weeks after getting the vaccine, but it takes time to pass them to your baby. So the preferred time to get the whooping cough vaccine is early in your third trimester. The amount of whooping cough antibodies in your body decreases over time. That is why CDC recommends you get a whooping cough vaccine during each pregnancy. Doing so allows each of your babies to get the greatest number of protective antibodies from you. This means each of your babies will get the best protection possible against this disease.  Getting the whooping cough vaccine while pregnant is better than getting the vaccine after you give birth Whooping cough vaccination during   pregnancy is ideal so your baby will have short-term protection as soon as he is born. This early protection is important because your baby will not start getting his whooping cough vaccines until he is 2 months old. These first few months of life are when your baby is at greatest risk for catching whooping cough. This is also when he's at  greatest risk for having severe, potentially life-threating complications from the infection. To avoid that gap in protection, it is best to get a whooping cough vaccine during pregnancy. You will then pass protection to your baby before he is born. To continue protecting your baby, he should get whooping cough vaccines starting at 2 months old. You may never have gotten the Tdap vaccine before and did not get it during this pregnancy. If so, you should make sure to get the vaccine immediately after you give birth, before leaving the hospital or birthing center. It will take about 2 weeks before your body develops protection (antibodies) in response to the vaccine. Once you have protection from the vaccine, you are less likely to give whooping cough to your newborn while caring for him. But remember, your baby will still be at risk for catching whooping cough from others. A recent study looked to see how effective Tdap was at preventing whooping cough in babies whose mothers got the vaccine while pregnant or in the hospital after giving birth. The study found that getting Tdap between 27 through 36 weeks of pregnancy is 85% more effective at preventing whooping cough in babies younger than 2 months old. Blood tests cannot tell if you need a whooping cough vaccine There are no blood tests that can tell you if you have enough antibodies in your body to protect yourself or your baby against whooping cough. Even if you have been sick with whooping cough in the past or previously received the vaccine, you still should get the vaccine during each pregnancy. Breastfeeding may pass some protective antibodies onto your baby By breastfeeding, you may pass some antibodies you have made in response to the vaccine to your baby. When you get a whooping cough vaccine during your pregnancy, you will have antibodies in your breast milk that you can share with your baby as soon as your milk comes in. However, your baby will not  get protective antibodies immediately if you wait to get the whooping cough vaccine until after delivering your baby. This is because it takes about 2 weeks for your body to create antibodies. Learn more about the health benefits of breastfeeding.   Vaginal Birth After Cesarean Delivery  Vaginal birth after cesarean delivery (VBAC) is giving birth vaginally after previously delivering a baby through a cesarean section (C-section). A VBAC may be a safe option for you, depending on your health and other factors. It is important to discuss VBAC with your health care provider early in your pregnancy so you can understand the risks, benefits, and options. Having these discussions early will give you time to make your birth plan. Who are the best candidates for VBAC? The best candidates for VBAC are women who:  Have had one or two prior cesarean deliveries, and the incision made during the delivery was horizontal (low transverse).  Do not have a vertical (classical) scar on their uterus.  Have not had a tear in the wall of their uterus (uterine rupture).  Plan to have more pregnancies. A VBAC is also more likely to be successful:  In women who have previously  given birth vaginally.  When labor starts by itself (spontaneously) before the due date. What are the benefits of VBAC? The benefits of delivering your baby vaginally instead of by a cesarean delivery include:  A shorter hospital stay.  A faster recovery time.  Less pain.  Avoiding risks associated with major surgery, such as infection and blood clots.  Less blood loss and less need for donated blood (transfusions). What are the risks of VBAC? The main risk of attempting a VBAC is that it may fail, forcing your health care provider to deliver your baby by a C-section. Other risks are rare and include:  Tearing (rupture) of the scar from a past cesarean delivery.  Other risks associated with vaginal deliveries. If a repeat  cesarean delivery is needed, the risks include:  Blood loss.  Infection.  Blood clot.  Damage to surrounding organs.  Removal of the uterus (hysterectomy), if it is damaged.  Placenta problems in future pregnancies. What else should I know about my options? Delivering a baby through a VBAC is similar to having a normal spontaneous vaginal delivery. Therefore, it is safe:  To try with twins.  For your health care provider to try to turn the baby from a breech position (external cephalic version) during labor.  With epidural analgesia for pain relief. Consider where you would like to deliver your baby. VBAC should be attempted in facilities where an emergency cesarean delivery can be performed. VBAC is not recommended for home births. Any changes in your health or your baby's health during your pregnancy may make it necessary to change your initial decision about VBAC. Your health care provider may recommend that you do not attempt a VBAC if:  Your baby's suspected weight is 8.8 lb (4 kg) or more.  You have preeclampsia. This is a condition that causes high blood pressure along with other symptoms, such as swelling and headaches.  You will have VBAC less than 19 months after your cesarean delivery.  You are past your due date.  You need to have labor started (induced) because your cervix is not ready for labor (unfavorable). Where to find more information  American Pregnancy Association: americanpregnancy.org  [employer]: acog.org Summary  Vaginal birth after cesarean delivery (VBAC) is giving birth vaginally after previously delivering a baby through a cesarean section (C-section). A VBAC may be a safe option for you, depending on your health and other factors.  Discuss VBAC with your health care provider early in your pregnancy so you can understand the risks, benefits, options, and have plenty of time to make your birth plan.  The  main risk of attempting a VBAC is that it may fail, forcing your health care provider to deliver your baby by a C-section. Other risks are rare. This information is not intended to replace advice given to you by your health care provider. Make sure you discuss any questions you have with your health care provider. Document Revised: 08/21/2018 Document Reviewed: 08/02/2016 Elsevier Patient Education  2021 [employer].

## 2020-09-01 NOTE — Progress Notes (Signed)
PRENATAL VISIT NOTE  Subjective:  Alexis Blanchard is a 25 y.o. [phone] at [redacted]w[redacted]d being seen today for ongoing prenatal care.  She is currently monitored for the following issues for this low-risk pregnancy and has Alpha thalassemia silent carrier; Group B streptococcal bacteriuria; History of severe preeclampsia in prior pregnancy needing delivery at 34 weeks; Previous low transverse cesarean delivery for breech at 34 weeks for severe preeclampsia, antepartum; Supervision of other normal pregnancy, antepartum; UTI in pregnancy, first trimester; False positive RPR test; ASCUS with positive high risk HPV cervical on 05/20/2020; and COVID-19 affecting pregnancy in first trimester on their problem list.  Patient reports no complaints.  Contractions: Not present. Vag. Bleeding: None.  Movement: Present. Denies leaking of fluid.   The following portions of the patient's history were reviewed and updated as appropriate: allergies, current medications, past family history, past medical history, past social history, past surgical history and problem list.   Objective:   Vitals:   09/01/20 1306  BP: 117/70  Pulse: 92  Weight: 136 lb 12.8 oz (62.1 kg)    Fetal Status: Fetal Heart Rate (bpm): 160   Movement: Present     General:  Alert, oriented and cooperative. Patient is in no acute distress.  Skin: Skin is warm and dry. No rash noted.   Cardiovascular: Normal heart rate noted  Respiratory: Normal respiratory effort, no problems with respiration noted  Abdomen: Soft, gravid, appropriate for gestational age.  Pain/Pressure: Absent     Pelvic: Cervical exam deferred        Extremities: Normal range of motion.     Mental Status: Normal mood and affect. Normal behavior. Normal judgment and thought content.   Imaging: [place] MFM OB FOLLOW UP  Result Date: 08/18/2020 ----------------------------------------------------------------------  OBSTETRICS REPORT                       (Signed Final 08/18/2020  04:22 pm) ---------------------------------------------------------------------- Patient Info  ID #:       [id number]                          D.O.B.:  02-26-1996 (25 yrs)  Name:       Alexis Blanchard                Visit Date: 08/18/2020 03:22 pm ---------------------------------------------------------------------- Performed By  Attending:        Ma Rings MD         Ref. Address:     [address]                                                             [place], [place]  [id number]  Performed By:     Truitt Leepiana Strickland,      [facility]  Referred By:      Tereso NewcomerUGONNA A                    Misao Fackrell MD ---------------------------------------------------------------------- Orders  #  Description                           Code        Ordered By  1  US MFM OB FOLLOW UP                   [id number]    Noralee SpaceAVI SHANKAR ----------------------------------------------------------------------  #  Order #                     Accession #                Episode #  1  [id number]                   [id number]                 [id number] ---------------------------------------------------------------------- Indications  Poor obstetric history: Previous               O09.299  preeclampsia / eclampsia/gestational HTN  Poor obstetric history: Previous preterm       O09.219  delivery, antepartum  Abnormal fetal ultrasound (increased NFT)      O28.9  Abnormal chromosomal and genetic finding       O28.5  on antenatal screening of mother (alpha  thalassemia silent carrier)  Previous C-section  Encounter for other antenatal screening        Z36.2  follow-up (Low risk Nips, neg AFP)  [redacted] weeks  gestation of pregnancy                Z3A.23 ---------------------------------------------------------------------- Fetal Evaluation  Num Of Fetuses:         1  Cardiac Activity:       Observed  Presentation:           Cephalic  Placenta:               Anterior  P. Cord Insertion:      Visualized, central  Amniotic Fluid  AFI FV:      Within normal  limits                              Largest Pocket(cm)                              5.26 ---------------------------------------------------------------------- Biometry  BPD:        57  mm     G. Age:  23w 3d         56  %    CI:        72.81   %    70 - 86                                                          FL/HC:      21.8   %    19.2 - 20.8  HC:      212.4  mm     G. Age:  23w 2d         41  %    HC/AC:      1.11        1.05 - 1.21  AC:       191   mm     G. Age:  23w 6d         64  %    FL/BPD:     81.1   %    71 - 87  FL:       46.2  mm     G. Age:  25w 2d         94  %    FL/AC:      24.2   %    20 - 24  CER:      26.3  mm     G. Age:  23w 5d         88  %  LV:        6.4  mm  Est. FW:     684  gm      1 lb 8 oz     92  % ---------------------------------------------------------------------- OB History  Gravidity:    4         Prem:   1         SAB:   2  Living:       1 ---------------------------------------------------------------------- Gestational Age  U/S Today:     24w 0d                                        EDD:   12/08/20  Best:          23w 1d     Det. By:  Previous Ultrasound      EDD:   12/14/20                                      (05/16/20) ---------------------------------------------------------------------- Anatomy  Cranium:               Appears normal  Aortic Arch:            Appears normal  Cavum:                 Appears normal         Ductal Arch:            Appears normal  Ventricles:            Appears normal         Diaphragm:              Previously seen  Choroid Plexus:        Appears normal         Stomach:                 Appears normal, left                                                                        sided  Cerebellum:            Appears normal         Abdomen:                Previously seen  Posterior Fossa:       Appears normal         Abdominal Wall:         Previously seen  Nuchal Fold:           Not applicable (>20    Cord Vessels:           Previously seen                         wks GA)  Face:                  Orbits and profile     Kidneys:                Appear normal                         previously seen  Lips:                  Appears normal         Bladder:                Appears normal  Thoracic:              Previously seen        Spine:                  Appears normal  Heart:                 Appears normal         Upper Extremities:      Previously seen                         (4CH, axis, and                         situs)  RVOT:  Appears normal         Lower Extremities:      Previously seen  LVOT:                  Appears normal ---------------------------------------------------------------------- Cervix Uterus Adnexa  Cervix  Length:           3.22  cm.  Normal appearance by transabdominal scan. ---------------------------------------------------------------------- Comments  This patient was seen for a follow up exam as the views of  the fetal anatomy were unable to be fully visualized during  her last exam.  She denies any problems since her last exam.  She was informed that the fetal growth and amniotic fluid  level appears appropriate for her gestational age.  The views of the fetal anatomy were visualized today.  There  were no obvious anomalies noted.  The limitations of ultrasound in the detection of all anomalies  was discussed.  Follow-up as indicated. ----------------------------------------------------------------------                   Ma Rings, MD Electronically Signed Final Report   08/18/2020 04:22 pm  ----------------------------------------------------------------------  Assessment and Plan:  Pregnancy: [id number] at [redacted]w[redacted]d 1. History of severe preeclampsia in prior pregnancy needing delivery at 34 weeks Stable BP. Continue ASA.  Monitor BP. Had cesarean section, thinking about TOLAC. Discussed risks and benefits, given information to review at home, will sign consent next visit.  2. [redacted] weeks gestation of pregnancy 3. Supervision of other normal pregnancy, antepartum Normal anatomy scan.  No other issues. Third trimester labs next visit. Preterm labor symptoms and general obstetric precautions including but not limited to vaginal bleeding, contractions, leaking of fluid and fetal movement were reviewed in detail with the patient. Please refer to After Visit Summary for other counseling recommendations.   Return in about 3 weeks (around 09/22/2020) for 2 hr GTT, OB visit as already scheduled.  Future Appointments  Date Time Provider Department Center  09/22/2020  8:45 AM Allin Frix, Jethro Bastos, MD [facility]    Jaynie Collins, MD

## 2020-09-22 ENCOUNTER — Ambulatory Visit (INDEPENDENT_AMBULATORY_CARE_PROVIDER_SITE_OTHER): Payer: Medicaid Other | Admitting: Obstetrics & Gynecology

## 2020-09-22 ENCOUNTER — Other Ambulatory Visit: Payer: Self-pay | Admitting: Obstetrics & Gynecology

## 2020-09-22 ENCOUNTER — Other Ambulatory Visit: Payer: Self-pay

## 2020-09-22 VITALS — BP 122/83 | HR 102 | Wt 133.0 lb

## 2020-09-22 DIAGNOSIS — Z3A28 28 weeks gestation of pregnancy: Secondary | ICD-10-CM

## 2020-09-22 DIAGNOSIS — Z23 Encounter for immunization: Secondary | ICD-10-CM

## 2020-09-22 DIAGNOSIS — Z3480 Encounter for supervision of other normal pregnancy, unspecified trimester: Secondary | ICD-10-CM

## 2020-09-22 DIAGNOSIS — O09299 Supervision of pregnancy with other poor reproductive or obstetric history, unspecified trimester: Secondary | ICD-10-CM

## 2020-09-22 DIAGNOSIS — O09293 Supervision of pregnancy with other poor reproductive or obstetric history, third trimester: Secondary | ICD-10-CM

## 2020-09-22 DIAGNOSIS — O34219 Maternal care for unspecified type scar from previous cesarean delivery: Secondary | ICD-10-CM

## 2020-09-22 LAB — COMPREHENSIVE METABOLIC PANEL
ALT: 9 IU/L (ref 0–32)
AST: 18 IU/L (ref 0–40)
Albumin/Globulin Ratio: 1.5 (ref 1.2–2.2)
Albumin: 3.5 g/dL — ABNORMAL LOW (ref 3.9–5.0)
Alkaline Phosphatase: 71 IU/L (ref 44–121)
BUN/Creatinine Ratio: 12 (ref 9–23)
BUN: 6 mg/dL (ref 6–20)
Bilirubin Total: 0.3 mg/dL (ref 0.0–1.2)
CO2: 19 mmol/L — ABNORMAL LOW (ref 20–29)
Calcium: 8.6 mg/dL — ABNORMAL LOW (ref 8.7–10.2)
Chloride: 103 mmol/L (ref 96–106)
Creatinine, Ser: 0.52 mg/dL — ABNORMAL LOW (ref 0.57–1.00)
Globulin, Total: 2.3 g/dL (ref 1.5–4.5)
Glucose: 91 mg/dL (ref 65–99)
Potassium: 4 mmol/L (ref 3.5–5.2)
Sodium: 136 mmol/L (ref 134–144)
Total Protein: 5.8 g/dL — ABNORMAL LOW (ref 6.0–8.5)
eGFR: 134 mL/min/{1.73_m2} (ref >59)

## 2020-09-22 NOTE — Patient Instructions (Signed)
Return to office for any scheduled appointments. Call the office or go to the MAU at [place] at [facility] if:  You begin to have strong, frequent contractions  Your water breaks.  Sometimes it is a big gush of fluid, sometimes it is just a trickle that keeps getting your panties wet or running down your legs  You have vaginal bleeding.  It is normal to have a small amount of spotting if your cervix was checked.   You do not feel your baby moving like normal.  If you do not, get something to eat and drink and lay down and focus on feeling your baby move.   If your baby is still not moving like normal, you should call the office or go to MAU.  Any other obstetric concerns.   [email or web address].com/.[email or web address]">  Third Trimester of Pregnancy  The third trimester of pregnancy is from week 28 through week 40. This is months 7 through 9. The third trimester is a time when the unborn baby (fetus) is growing rapidly. At the end of the ninth month, the fetus is about 20 inches long and weighs 6-10 pounds. Body changes during your third trimester During the third trimester, your body will continue to go through many changes. The changes vary and generally return to normal after your baby is born. Physical changes  Your weight will continue to increase. You can expect to gain 25-35 pounds (11-16 kg) by the end of the pregnancy if you begin pregnancy at a normal weight. If you are underweight, you can expect to gain 28-40 lb (about 13-18 kg), and if you are overweight, you can expect to gain 15-25 lb (about 7-11 kg).  You may begin to get stretch marks on your hips, abdomen, and breasts.  Your breasts will continue to grow and may hurt. A yellow fluid (colostrum) may leak from your breasts. This is the first milk you are producing for your baby.  You may have changes in your hair. These can include thickening of your hair, rapid growth, and  changes in texture. Some people also have hair loss during or after pregnancy, or hair that feels dry or thin.  Your belly button may stick out.  You may notice more swelling in your hands, face, or ankles. Health changes  You may have heartburn.  You may have constipation.  You may develop hemorrhoids.  You may develop swollen, bulging veins (varicose veins) in your legs.  You may have increased body aches in the pelvis, back, or thighs. This is due to weight gain and increased hormones that are relaxing your joints.  You may have increased tingling or numbness in your hands, arms, and legs. The skin on your abdomen may also feel numb.  You may feel short of breath because of your expanding uterus. Other changes  You may urinate more often because the fetus is moving lower into your pelvis and pressing on your bladder.  You may have more problems sleeping. This may be caused by the size of your abdomen, an increased need to urinate, and an increase in your body's metabolism.  You may notice the fetus "dropping," or moving lower in your abdomen (lightening).  You may have increased vaginal discharge.  You may notice that you have pain around your pelvic bone as your uterus distends. Follow these instructions at home: Medicines  Follow your health care provider's instructions regarding medicine use. Specific medicines may be either safe or unsafe to take during  pregnancy. Do not take any medicines unless approved by your health care provider.  Take a prenatal vitamin that contains at least 600 micrograms (mcg) of folic acid. Eating and drinking  Eat a healthy diet that includes fresh fruits and vegetables, whole grains, good sources of protein such as meat, eggs, or tofu, and low-fat dairy products.  Avoid raw meat and unpasteurized juice, milk, and cheese. These carry germs that can harm you and your baby.  Eat 4 or 5 small meals rather than 3 large meals a day.  You may  need to take these actions to prevent or treat constipation: ? Drink enough fluid to keep your urine pale yellow. ? Eat foods that are high in fiber, such as beans, whole grains, and fresh fruits and vegetables. ? Limit foods that are high in fat and processed sugars, such as fried or sweet foods. Activity  Exercise only as directed by your health care provider. Most people can continue their usual exercise routine during pregnancy. Try to exercise for 30 minutes at least 5 days a week. Stop exercising if you experience contractions in the uterus.  Stop exercising if you develop pain or cramping in the lower abdomen or lower back.  Avoid heavy lifting.  Do not exercise if it is very hot or humid or if you are at a high altitude.  If you choose to, you may continue to have sex unless your health care provider tells you not to. Relieving pain and discomfort  Take frequent breaks and rest with your legs raised (elevated) if you have leg cramps or low back pain.  Take warm sitz baths to soothe any pain or discomfort caused by hemorrhoids. Use hemorrhoid cream if your health care provider approves.  Wear a supportive bra to prevent discomfort from breast tenderness.  If you develop varicose veins: ? Wear support hose as told by your health care provider. ? Elevate your feet for 15 minutes, 3-4 times a day. ? Limit salt in your diet. Safety  Talk to your health care provider before traveling far distances.  Do not use hot tubs, steam rooms, or saunas.  Wear your seat belt at all times when driving or riding in a car.  Talk with your health care provider if someone is verbally or physically abusive to you. Preparing for birth To prepare for the arrival of your baby:  Take prenatal classes to understand, practice, and ask questions about labor and delivery.  Visit the hospital and tour the maternity area.  Purchase a rear-facing car seat and make sure you know how to install it in  your car.  Prepare the baby's room or sleeping area. Make sure to remove all pillows and stuffed animals from the baby's crib to prevent suffocation. General instructions  Avoid cat litter boxes and soil used by cats. These carry germs that can cause birth defects in the baby. If you have a cat, ask someone to clean the litter box for you.  Do not douche or use tampons. Do not use scented sanitary pads.  Do not use any products that contain nicotine or tobacco, such as cigarettes, e-cigarettes, and chewing tobacco. If you need help quitting, ask your health care provider.  Do not use any herbal remedies, illegal drugs, or medicines that were not prescribed to you. Chemicals in these products can harm your baby.  Do not drink alcohol.  You will have more frequent prenatal exams during the third trimester. During a routine prenatal visit, your  health care provider will do a physical exam, perform tests, and discuss your overall health. Keep all follow-up visits. This is important. Where to find more information  American Pregnancy Association: americanpregnancy.org  American College of Obstetricians and Gynecologists: acog.org/en/Womens%20Health/Pregnancy  Office on Women's Health: womenshealth.gov/pregnancy Contact a health care provider if you have:  A fever.  Mild pelvic cramps, pelvic pressure, or nagging pain in your abdominal area or lower back.  Vomiting or diarrhea.  Bad-smelling vaginal discharge or foul-smelling urine.  Pain when you urinate.  A headache that does not go away when you take medicine.  Visual changes or see spots in front of your eyes. Get help right away if:  Your water breaks.  You have regular contractions less than 5 minutes apart.  You have spotting or bleeding from your vagina.  You have severe abdominal pain.  You have difficulty breathing.  You have chest pain.  You have fainting spells.  You have not felt your baby move for the  time period told by your health care provider.  You have new or increased pain, swelling, or redness in an arm or leg. Summary  The third trimester of pregnancy is from week 28 through week 40 (months 7 through 9).  You may have more problems sleeping. This can be caused by the size of your abdomen, an increased need to urinate, and an increase in your body's metabolism.  You will have more frequent prenatal exams during the third trimester. Keep all follow-up visits. This is important. This information is not intended to replace advice given to you by your health care provider. Make sure you discuss any questions you have with your health care provider. Document Revised: 10/02/2019 Document Reviewed: 08/08/2019 Elsevier Patient Education  2021 Elsevier Inc.  

## 2020-09-22 NOTE — Progress Notes (Signed)
   PRENATAL VISIT NOTE  Subjective:  Alexis Blanchard is a 25 y.o. [phone] at [redacted]w[redacted]d being seen today for ongoing prenatal care.  She is currently monitored for the following issues for this high-risk pregnancy and has Alpha thalassemia silent carrier; Group B streptococcal bacteriuria; History of severe preeclampsia in prior pregnancy needing delivery at 34 weeks; Previous low transverse cesarean delivery for breech at 34 weeks for severe preeclampsia, antepartum; Supervision of other normal pregnancy, antepartum; UTI in pregnancy, first trimester; False positive RPR test; ASCUS with positive high risk HPV cervical on 05/20/2020; and COVID-19 affecting pregnancy in first trimester on their problem list.  Patient reports no complaints.  Contractions: Not present. Vag. Bleeding: None.  Movement: Absent. Denies leaking of fluid.   The following portions of the patient's history were reviewed and updated as appropriate: allergies, current medications, past family history, past medical history, past social history, past surgical history and problem list.   Objective:   Vitals:   09/22/20 0908  BP: 122/83  Pulse: (!) 102  Weight: 133 lb (60.3 kg)    Fetal Status: Fetal Heart Rate (bpm): 150 Fundal Height: 28 cm Movement: Absent     General:  Alert, oriented and cooperative. Patient is in no acute distress.  Skin: Skin is warm and dry. No rash noted.   Cardiovascular: Normal heart rate noted  Respiratory: Normal respiratory effort, no problems with respiration noted  Abdomen: Soft, gravid, appropriate for gestational age.  Pain/Pressure: Absent     Pelvic: Cervical exam deferred        Extremities: Normal range of motion.  Edema: None  Mental Status: Normal mood and affect. Normal behavior. Normal judgment and thought content.   Assessment and Plan:  Pregnancy: [id number] at [redacted]w[redacted]d 1. History of severe preeclampsia in prior pregnancy needing delivery at 34 weeks She is not taking her ASA!  Emphasized importance of this!!!! Will check surveillance CMET today with her labs. - Comprehensive metabolic panel  2. Previous low transverse cesarean delivery for breech at 34 weeks for severe preeclampsia, antepartum Counseled regarding TOLAC vs RCS; risks/benefits discussed in detail. All questions answered.  Patient elects for TOLAC, consent signed 09/22/2020.  3. Need for Tdap vaccination - Tdap vaccine greater than or equal to 7yo IM given today  4. [redacted] weeks gestation of pregnancy 5. Supervision of other normal pregnancy, antepartum Third trimester labs done today. - Glucose Tolerance, 2 Hours w/1 Hour - CBC - RPR - HIV Antibody (routine testing w rflx) - Comprehensive metabolic panel Preterm labor symptoms and general obstetric precautions including but not limited to vaginal bleeding, contractions, leaking of fluid and fetal movement were reviewed in detail with the patient. Please refer to After Visit Summary for other counseling recommendations.   Return in about 2 weeks (around 10/06/2020) for OFFICE OB VISIT (MD only).  Future Appointments  Date Time Provider Department Center  10/06/2020  8:30 AM Clarendon Bing, MD [facility]    Jaynie Collins, MD

## 2020-09-23 LAB — COMPREHENSIVE METABOLIC PANEL
ALT: 11 IU/L (ref 0–32)
AST: 14 IU/L (ref 0–40)
Albumin/Globulin Ratio: 1.4 (ref 1.2–2.2)
Albumin: 3.6 g/dL — ABNORMAL LOW (ref 3.9–5.0)
Alkaline Phosphatase: 72 IU/L (ref 44–121)
BUN/Creatinine Ratio: 10 (ref 9–23)
BUN: 5 mg/dL — ABNORMAL LOW (ref 6–20)
Bilirubin Total: 0.3 mg/dL (ref 0.0–1.2)
CO2: 19 mmol/L — ABNORMAL LOW (ref 20–29)
Calcium: 8.7 mg/dL (ref 8.7–10.2)
Chloride: 105 mmol/L (ref 96–106)
Creatinine, Ser: 0.5 mg/dL — ABNORMAL LOW (ref 0.57–1.00)
Globulin, Total: 2.6 g/dL (ref 1.5–4.5)
Glucose: 81 mg/dL (ref 65–99)
Potassium: 4.3 mmol/L (ref 3.5–5.2)
Sodium: 139 mmol/L (ref 134–144)
Total Protein: 6.2 g/dL (ref 6.0–8.5)
eGFR: 135 mL/min/{1.73_m2} (ref >59)

## 2020-09-23 LAB — CBC
Hematocrit: 37.4 % (ref 34.0–46.6)
Hemoglobin: 12.2 g/dL (ref 11.1–15.9)
MCH: 27.5 pg (ref 26.6–33.0)
MCHC: 32.6 g/dL (ref 31.5–35.7)
MCV: 84 fL (ref 79–97)
Platelets: 298 10*3/uL (ref 150–450)
RBC: 4.43 x10E6/uL (ref 3.77–5.28)
RDW: 14.2 % (ref 11.7–15.4)
WBC: 8.9 10*3/uL (ref 3.4–10.8)

## 2020-09-23 LAB — HIV ANTIBODY (ROUTINE TESTING W REFLEX): HIV Screen 4th Generation wRfx: NONREACTIVE

## 2020-09-23 LAB — GLUCOSE TOLERANCE, 2 HOURS W/ 1HR
Glucose, 1 hour: 166 mg/dL (ref 65–179)
Glucose, 2 hour: 94 mg/dL (ref 65–152)
Glucose, Fasting: 86 mg/dL (ref 65–91)

## 2020-09-23 LAB — RPR: RPR Ser Ql: NONREACTIVE

## 2020-09-28 ENCOUNTER — Encounter (HOSPITAL_COMMUNITY): Payer: Self-pay | Admitting: Obstetrics & Gynecology

## 2020-09-28 ENCOUNTER — Inpatient Hospital Stay (HOSPITAL_COMMUNITY)
Admission: AD | Admit: 2020-09-28 | Discharge: 2020-09-28 | Disposition: A | Discharge status: Home / Self Care | Payer: Medicaid Other | Attending: Obstetrics & Gynecology | Admitting: Obstetrics & Gynecology

## 2020-09-28 ENCOUNTER — Other Ambulatory Visit: Payer: Self-pay

## 2020-09-28 DIAGNOSIS — O99513 Diseases of the respiratory system complicating pregnancy, third trimester: Secondary | ICD-10-CM | POA: Diagnosis not present

## 2020-09-28 DIAGNOSIS — Z20822 Contact with and (suspected) exposure to covid-19: Secondary | ICD-10-CM | POA: Diagnosis not present

## 2020-09-28 DIAGNOSIS — Z3689 Encounter for other specified antenatal screening: Secondary | ICD-10-CM

## 2020-09-28 DIAGNOSIS — O98513 Other viral diseases complicating pregnancy, third trimester: Secondary | ICD-10-CM | POA: Diagnosis present

## 2020-09-28 DIAGNOSIS — J069 Acute upper respiratory infection, unspecified: Secondary | ICD-10-CM | POA: Diagnosis not present

## 2020-09-28 DIAGNOSIS — Z3A29 29 weeks gestation of pregnancy: Secondary | ICD-10-CM | POA: Diagnosis not present

## 2020-09-28 LAB — URINALYSIS, ROUTINE W REFLEX MICROSCOPIC
Bilirubin Urine: NEGATIVE
Glucose, UA: NEGATIVE mg/dL
Hgb urine dipstick: NEGATIVE
Ketones, ur: NEGATIVE mg/dL
Leukocytes,Ua: NEGATIVE
Nitrite: NEGATIVE
Protein, ur: NEGATIVE mg/dL
Specific Gravity, Urine: 1.012 (ref 1.005–1.030)
pH: 7 (ref 5.0–8.0)

## 2020-09-28 LAB — GROUP A STREP BY PCR: Group A Strep by PCR: NOT DETECTED

## 2020-09-28 LAB — RESP PANEL BY RT-PCR (FLU A&B, COVID) ARPGX2
Influenza A by PCR: NEGATIVE
Influenza B by PCR: NEGATIVE
SARS Coronavirus 2 by RT PCR: NEGATIVE

## 2020-09-28 MED ORDER — CETIRIZINE HCL 10 MG PO TABS: 10.0000 mg | ORAL_TABLET | Freq: Every day | ORAL | 1 refills | Status: DC

## 2020-09-28 MED ORDER — PSEUDOEPHEDRINE HCL 30 MG PO TABS: 30.0000 mg | ORAL_TABLET | Freq: Four times a day (QID) | ORAL | 0 refills | Status: DC | PRN

## 2020-09-28 NOTE — Discharge Instructions (Signed)
Viral Respiratory Infection A viral respiratory infection is an illness that affects parts of the body that are used for breathing. These include the lungs, nose, and throat. It is caused by a germ called a virus. Some examples of this kind of infection are:  A cold.  The flu (influenza).  A respiratory syncytial virus (RSV) infection. A person who gets this illness may have the following symptoms:  A stuffy or runny nose.  Yellow or green fluid in the nose.  A cough.  Sneezing.  Tiredness (fatigue).  Achy muscles.  A sore throat.  Sweating or chills.  A fever.  A headache. Follow these instructions at home: Managing pain and congestion  Take over-the-counter and prescription medicines only as told by your doctor.  If you have a sore throat, gargle with salt water. Do this 3-4 times per day or as needed. To make a salt-water mixture, dissolve -1 tsp of salt in 1 cup of warm water. Make sure that all the salt dissolves.  Use nose drops made from salt water. This helps with stuffiness (congestion). It also helps soften the skin around your nose.  Drink enough fluid to keep your pee (urine) pale yellow. General instructions  Rest as much as possible.  Do not drink alcohol.  Do not use any products that have nicotine or tobacco, such as cigarettes and e-cigarettes. If you need help quitting, ask your doctor.  Keep all follow-up visits as told by your doctor. This is important.   How is this prevented?  Get a flu shot every year. Ask your doctor when you should get your flu shot.  Do not let other people get your germs. If you are sick: ? Stay home from work or school. ? Wash your hands with soap and water often. Wash your hands after you cough or sneeze. If soap and water are not available, use hand sanitizer.  Avoid contact with people who are sick during cold and flu season. This is in fall and winter.   Get help if:  Your symptoms last for 10 days or  longer.  Your symptoms get worse over time.  You have a fever.  You have very bad pain in your face or forehead.  Parts of your jaw or neck become very swollen. Get help right away if:  You feel pain or pressure in your chest.  You have shortness of breath.  You faint or feel like you will faint.  You keep throwing up (vomiting).  You feel confused. Summary  A viral respiratory infection is an illness that affects parts of the body that are used for breathing.  Examples of this illness include a cold, the flu, and respiratory syncytial virus (RSV) infection.  The infection can cause a runny nose, cough, sneezing, sore throat, and fever.  Follow what your doctor tells you about taking medicines, drinking lots of fluid, washing your hands, resting at home, and avoiding people who are sick. This information is not intended to replace advice given to you by your health care provider. Make sure you discuss any questions you have with your health care provider. Document Revised: 05/03/2018 Document Reviewed: 06/05/2017 Elsevier Patient Education  2021 Elsevier Inc.  

## 2020-09-28 NOTE — MAU Note (Signed)
Pt stated she started having nasal congestion and sore throat and cough about 2 days ago though it ws her allergies. C/o having abd pain when she coughs that started yesterday.  No abd any time else just when she coughs. Not tried any meds for cough sore throat.

## 2020-09-28 NOTE — MAU Provider Note (Signed)
History     CSN: [id number]  Arrival date and time: 09/28/20 1048   Event Date/Time   First Provider Initiated Contact with Patient 09/28/20 1125      Chief Complaint  Patient presents with  . Sore Throat   25 y.o. [id number] _0 .0 wks presenting with cough, nasal congestion, sore throat x2 days. Cough is non-productive. Denies fevers. Denies sick contacts. No known Covid exposure. Denies SOB or CP. States she thinks this is seasonal allergies. Has not tried any OTC meds. Also reports abd pain when she coughs. Denies VB, LOF, ctx. +FM.   OB History    Gravida  4   Para  1   Term  0   Preterm  1   AB  2   Living  1     SAB  2   IAB  0   Ectopic  0   Multiple  0   Live Births  1           Past Medical History:  Diagnosis Date  . Severe preeclampsia    Delivered at 34 weeks  . Syphilis     Past Surgical History:  Procedure Laterality Date  . CESAREAN SECTION N/A 06/08/2017   Procedure: CESAREAN SECTION;  Surgeon: Lavonia Drafts, MD;  Location: [place];  Service: Obstetrics;  Laterality: N/A;  . TONSILLECTOMY      Family History  Problem Relation Age of Onset  . Hypertension Mother   . Heart disease Maternal Aunt        CHF  . Diabetes Maternal Grandmother   . Hypertension Maternal Grandmother   . Heart disease Maternal Grandmother   . Cancer Maternal Aunt 45       colon  . Cancer Maternal Aunt 45       throat  . Congestive Heart Failure Maternal Uncle     Social History   Tobacco Use  . Smoking status: Never Smoker  . Smokeless tobacco: Never Used  Vaping Use  . Vaping Use: Never used  Substance Use Topics  . Alcohol use: Not Currently    Comment: pt states that she has not used since finding out about the pregnancy  . Drug use: Not Currently    Types: Marijuana    Comment: pt states that she has not used since finding out about the pregnancy    Allergies: No Known Allergies  No medications prior to admission.     Review of Systems  Constitutional: Negative for chills and fever.  HENT: Positive for congestion, rhinorrhea, sneezing and sore throat. Negative for ear pain.   Respiratory: Positive for cough. Negative for shortness of breath.   Cardiovascular: Negative for chest pain.  Gastrointestinal: Positive for abdominal pain.  Genitourinary: Negative for vaginal bleeding and vaginal discharge.   Physical Exam   Blood pressure 124/76, pulse 97, temperature 99.1 F (37.3 C), resp. rate 18, last menstrual period 03/09/2020, SpO2 97 %.  Physical Exam Vitals and nursing note reviewed.  Constitutional:      General: She is not in acute distress.    Appearance: Normal appearance.  HENT:     Head: Normocephalic and atraumatic.     Right Ear: Tympanic membrane, ear canal and external ear normal.     Left Ear: Tympanic membrane, ear canal and external ear normal.     Nose: Rhinorrhea present.     Mouth/Throat:     Lips: Pink.     Mouth: Mucous membranes are moist.  Pharynx: Oropharynx is clear. Uvula midline. No pharyngeal swelling, oropharyngeal exudate or posterior oropharyngeal erythema.  Cardiovascular:     Rate and Rhythm: Normal rate and regular rhythm.     Heart sounds: Normal heart sounds.  Pulmonary:     Effort: Pulmonary effort is normal. No respiratory distress.     Breath sounds: Normal breath sounds. No stridor. No wheezing, rhonchi or rales.  Abdominal:     General: There is no distension.     Palpations: Abdomen is soft.     Tenderness: There is no abdominal tenderness.     Comments: gravid  Musculoskeletal:        General: Normal range of motion.     Cervical back: Normal range of motion.  Skin:    General: Skin is warm and dry.  Neurological:     General: No focal deficit present.     Mental Status: She is alert and oriented to person, place, and time.  Psychiatric:        Mood and Affect: Mood normal.        Behavior: Behavior normal.   EFM: 145 bpm, mod  variability, + accels, no decels Toco: none  Results for orders placed or performed during the hospital encounter of 09/28/20 (from the past 24 hour(s))  Urinalysis, Routine w reflex microscopic Urine, Clean Catch     Status: Abnormal   Collection Time: 09/28/20 11:22 AM  Result Value Ref Range   Color, Urine YELLOW YELLOW   APPearance CLOUDY (A) CLEAR   Specific Gravity, Urine 1.012 1.005 - 1.030   pH 7.0 5.0 - 8.0   Glucose, UA NEGATIVE NEGATIVE mg/dL   Hgb urine dipstick NEGATIVE NEGATIVE   Bilirubin Urine NEGATIVE NEGATIVE   Ketones, ur NEGATIVE NEGATIVE mg/dL   Protein, ur NEGATIVE NEGATIVE mg/dL   Nitrite NEGATIVE NEGATIVE   Leukocytes,Ua NEGATIVE NEGATIVE  Resp Panel by RT-PCR (Flu A&B, Covid) Nasopharyngeal Swab     Status: None   Collection Time: 09/28/20 11:46 AM   Specimen: Nasopharyngeal Swab; Nasopharyngeal(NP) swabs in vial transport medium  Result Value Ref Range   SARS Coronavirus 2 by RT PCR NEGATIVE NEGATIVE   Influenza A by PCR NEGATIVE NEGATIVE   Influenza B by PCR NEGATIVE NEGATIVE  Group A Strep by PCR     Status: None   Collection Time: 09/28/20 11:46 AM   Specimen: Throat; Sterile Swab  Result Value Ref Range   Group A Strep by PCR NOT DETECTED NOT DETECTED   MAU Course  Procedures  MDM Labs ordered and reviewed. Suspect URI although could be allergic rhinitis. Discussed supportive therapies. Return for CP, SOB, fever. Abd pain likely MSK d/t coughing. Stable for discharge home.   Assessment and Plan   1. [redacted] weeks gestation of pregnancy   2. NST (non-stress test) reactive   3. Upper respiratory virus    Discharge home Follow up at [facility] as scheduled Return precautions Rx Sudafed Rx Zyrtec  Allergies as of 09/28/2020   No Known Allergies     Medication List    TAKE these medications   aspirin EC 81 MG tablet Take 1 tablet (81 mg total) by mouth daily. Take after 12 weeks for prevention of preeclampsia later in pregnancy   Blood  Pressure Kit Devi 1 Device by Does not apply route once a week. To be monitored weekly from home   cetirizine 10 MG tablet Commonly known as: ZyrTEC Allergy Take 1 tablet (10 mg total) by mouth daily.  Concept OB 130-92.4-1 MG Caps Take 1 tablet by mouth daily.   Doxylamine-Pyridoxine 10-10 MG Tbec Commonly known as: Diclegis Take 2 tablets by mouth at bedtime.   famotidine 20 MG tablet Commonly known as: PEPCID Take 1 tablet (20 mg total) by mouth 2 (two) times daily.   promethazine 25 MG tablet Commonly known as: PHENERGAN Take 1 tablet (25 mg total) by mouth every 6 (six) hours as needed for nausea or vomiting.   pseudoephedrine 30 MG tablet Commonly known as: SUDAFED Take 1 tablet (30 mg total) by mouth every 6 (six) hours as needed for congestion.       Julianne Handler , CNM 09/28/2020, 1:42 PM

## 2020-10-06 ENCOUNTER — Other Ambulatory Visit: Payer: Self-pay

## 2020-10-06 ENCOUNTER — Ambulatory Visit (INDEPENDENT_AMBULATORY_CARE_PROVIDER_SITE_OTHER): Payer: Medicaid Other | Admitting: Obstetrics and Gynecology

## 2020-10-06 VITALS — BP 124/83 | HR 99 | Wt 147.2 lb

## 2020-10-06 DIAGNOSIS — Z3A30 30 weeks gestation of pregnancy: Secondary | ICD-10-CM

## 2020-10-06 DIAGNOSIS — O0990 Supervision of high risk pregnancy, unspecified, unspecified trimester: Secondary | ICD-10-CM

## 2020-10-06 DIAGNOSIS — O34219 Maternal care for unspecified type scar from previous cesarean delivery: Secondary | ICD-10-CM

## 2020-10-06 DIAGNOSIS — U071 COVID-19: Secondary | ICD-10-CM

## 2020-10-06 DIAGNOSIS — Z8759 Personal history of other complications of pregnancy, childbirth and the puerperium: Secondary | ICD-10-CM

## 2020-10-06 DIAGNOSIS — O3662X0 Maternal care for excessive fetal growth, second trimester, not applicable or unspecified: Secondary | ICD-10-CM

## 2020-10-06 DIAGNOSIS — O98511 Other viral diseases complicating pregnancy, first trimester: Secondary | ICD-10-CM

## 2020-10-06 DIAGNOSIS — Z3480 Encounter for supervision of other normal pregnancy, unspecified trimester: Secondary | ICD-10-CM

## 2020-10-06 HISTORY — DX: Maternal care for excessive fetal growth, second trimester, not applicable or unspecified: O36.62X0

## 2020-10-06 NOTE — Patient Instructions (Signed)
Preeclampsia and Eclampsia Preeclampsia is a serious condition that may develop during pregnancy. This condition involves high blood pressure during pregnancy and causes symptoms such as headaches, vision changes, and increased swelling in the legs, hands, and face. Preeclampsia occurs after 20 weeks of pregnancy. Eclampsia is a seizure that happens from worsening preeclampsia. Diagnosing and managing preeclampsia early is important. If not treated early, it can cause serious problems for mother and baby. There is no cure for this condition. However, during pregnancy, delivering the baby may be the best treatment for preeclampsia or eclampsia. For most women, symptoms of preeclampsia and eclampsia go away after giving birth. In rare cases, a woman may develop preeclampsia or eclampsia after giving birth. This usually occurs within 48 hours after childbirth but may occur up to 6 weeks after giving birth.  What are the signs or symptoms? Common symptoms of this condition include:  A severe, throbbing headache that does not go away.  Vision problems, such as blurred or double vision and light sensitivity.  Pain in the stomach, especially the right upper region.  Pain in the shoulder. Other symptoms that may develop as the condition gets worse include:  Sudden weight gain because of fluid buildup in the body. This causes swelling of the face, hands, legs, and feet.  Severe nausea and vomiting.  Urinating less than usual.  Shortness of breath.  Seizures. How is this diagnosed? Your health care provider will ask you about symptoms and check for signs of preeclampsia during your prenatal visits. You will also have routine tests, including:  Checking your blood pressure.  Urine tests to check for protein.  Blood tests to assess your organ function.  Monitoring your baby's heart rate.  Ultrasounds to check fetal growth.   How is this treated? You and your health care provider will  determine the treatment that is best for you. Treatment may include:  Frequent prenatal visits to check for preeclampsia.  Medicine to lower your blood pressure.  Medicine to prevent seizures.  Low-dose aspirin during your pregnancy.  Staying in the hospital, in severe cases. You will be given medicines to control your blood pressure and the amount of fluids in your body.  Delivering your baby. Work with your health care provider to manage any chronic health conditions, such as diabetes or kidney problems. Also, work with your health care provider to manage weight gain during pregnancy. Follow these instructions at home: Eating and drinking  Drink enough fluid to keep your urine pale yellow.  Avoid caffeine. Caffeine may increase blood pressure and heart rate and lead to dehydration.  Reduce the amount of salt that you eat. Lifestyle  Do not use any products that contain nicotine or tobacco. These products include cigarettes, chewing tobacco, and vaping devices, such as e-cigarettes. If you need help quitting, ask your health care provider.  Do not use alcohol or drugs.  Avoid stress as much as possible.  Rest and get plenty of sleep. General instructions  Take over-the-counter and prescription medicines only as told by your health care provider.  When lying down, lie on your left side. This keeps pressure off your major blood vessels.  When sitting or lying down, raise (elevate) your feet. Try putting pillows underneath your lower legs.  Exercise regularly. Ask your health care provider what kinds of exercise are best for you.  Check your blood pressure as often as recommended by your health care provider.  Keep all prenatal and follow-up visits. This is important.  Contact a health care provider if:  You have symptoms that may need treatment or closer monitoring. These include: ? Headaches. ? Stomach pain or nausea and vomiting. ? Shoulder pain. ? Vision problems,  such as spots in front of your eyes or blurry vision. ? Sudden weight gain or increased swelling in your face, hands, legs, and feet. ? Increased anxiety or feeling of impending doom. ? Signs or symptoms of labor. Get help right away if:  You have any of the following symptoms: ? A seizure. ? Shortness of breath or trouble breathing. ? Trouble speaking or slurred speech. ? Fainting. ? Chest pain. These symptoms may represent a serious problem that is an emergency. Do not wait to see if the symptoms will go away. Get medical help right away. Call your local emergency services (911 in the [place] Do not drive yourself to the hospital. Summary  Preeclampsia is a serious condition that may develop during pregnancy.  Diagnosing and treating preeclampsia early is very important.  Keep all prenatal and follow-up visits. This is important.  Get help right away if you have a seizure, shortness of breath or trouble breathing, trouble speaking or slurred speech, chest pain, or fainting. This information is not intended to replace advice given to you by your health care provider. Make sure you discuss any questions you have with your health care provider. Document Revised: 01/16/2020 Document Reviewed: 01/16/2020 Elsevier Patient Education  2021 [employer].

## 2020-10-06 NOTE — Progress Notes (Signed)
    PRENATAL VISIT NOTE  Subjective:  Alexis Blanchard is a 25 y.o. [phone] at [redacted]w[redacted]d being seen today for ongoing prenatal care.  She is currently monitored for the following issues for this high-risk pregnancy and has Alpha thalassemia silent carrier; Group B streptococcal bacteriuria; History of severe pre-eclampsia; History of cesarean delivery affecting pregnancy; Supervision of high risk pregnancy, antepartum; False positive RPR test; ASCUS with positive high risk HPV cervical on 05/20/2020; COVID-19 affecting pregnancy in first trimester; and Excessive fetal growth affecting management of mother in second trimester, antepartum on their problem list.  Patient reports no complaints.  Contractions: Not present. Vag. Bleeding: None.  Movement: Present. Denies leaking of fluid.   The following portions of the patient's history were reviewed and updated as appropriate: allergies, current medications, past family history, past medical history, past social history, past surgical history and problem list.   Objective:   Vitals:   10/06/20 0825  BP: 124/83  Pulse: 99  Weight: 147 lb 3.2 oz (66.8 kg)    Fetal Status: Fetal Heart Rate (bpm): 143 Fundal Height: 30 cm Movement: Present     General:  Alert, oriented and cooperative. Patient is in no acute distress.  Skin: Skin is warm and dry. No rash noted.   Cardiovascular: Normal heart rate noted  Respiratory: Normal respiratory effort, no problems with respiration noted  Abdomen: Soft, gravid, appropriate for gestational age.  Pain/Pressure: Absent     Pelvic: Cervical exam deferred        Extremities: Normal range of motion.     Mental Status: Normal mood and affect. Normal behavior. Normal judgment and thought content.   Assessment and Plan:  Pregnancy: [id number] at [redacted]w[redacted]d 1. Supervision of other normal pregnancy, antepartum - [place] MFM OB FOLLOW UP; Future  2. History of cesarean delivery affecting pregnancy Desires tolac. Consent signed  on 5/17. S/p 2019 34wk pLTCS for breech  3. History of severe pre-eclampsia Emphasized importance of taking asa. Pt got gHTN at 30-31wks and then progressed to 34wk severe pre-eclampsia and delivery.   I d/w her recommend doing qwk visits, alternating rob with rn visit, and to do a udip each time. Precautions givens.   4. [redacted] weeks gestation of pregnancy  5. Excessive fetal growth affecting management of pregnancy in second trimester, single or unspecified fetus 4/12: 92%, 684g, ac 64%, FL 94%, nl AFI Normal 2h GTT Rpt ordered - US MFM OB FOLLOW UP; Future  Preterm labor symptoms and general obstetric precautions including but not limited to vaginal bleeding, contractions, leaking of fluid and fetal movement were reviewed in detail with the patient. Please refer to After Visit Summary for other counseling recommendations.   Return in about 1 week (around 10/13/2020) for needs weekly visits. next wk is rn bp and udip and 2wks is a low risk ob visit.  No future appointments.  Amorita Bing, MD

## 2020-10-13 ENCOUNTER — Ambulatory Visit: Payer: Medicaid Other

## 2020-10-19 ENCOUNTER — Ambulatory Visit: Payer: Medicaid Other | Attending: Obstetrics and Gynecology

## 2020-10-19 ENCOUNTER — Other Ambulatory Visit: Payer: Self-pay

## 2020-10-19 ENCOUNTER — Encounter: Payer: Self-pay | Admitting: *Deleted

## 2020-10-19 ENCOUNTER — Ambulatory Visit: Payer: Medicaid Other | Admitting: *Deleted

## 2020-10-19 VITALS — BP 122/70 | HR 92

## 2020-10-19 DIAGNOSIS — O34219 Maternal care for unspecified type scar from previous cesarean delivery: Secondary | ICD-10-CM | POA: Insufficient documentation

## 2020-10-19 DIAGNOSIS — U071 COVID-19: Secondary | ICD-10-CM | POA: Diagnosis not present

## 2020-10-19 DIAGNOSIS — O98511 Other viral diseases complicating pregnancy, first trimester: Secondary | ICD-10-CM | POA: Diagnosis not present

## 2020-10-19 DIAGNOSIS — R87610 Atypical squamous cells of undetermined significance on cytologic smear of cervix (ASC-US): Secondary | ICD-10-CM | POA: Insufficient documentation

## 2020-10-19 DIAGNOSIS — R7689 Other specified abnormal immunological findings in serum: Secondary | ICD-10-CM

## 2020-10-19 DIAGNOSIS — R87810 Cervical high risk human papillomavirus (HPV) DNA test positive: Secondary | ICD-10-CM | POA: Diagnosis not present

## 2020-10-19 DIAGNOSIS — Z3480 Encounter for supervision of other normal pregnancy, unspecified trimester: Secondary | ICD-10-CM | POA: Insufficient documentation

## 2020-10-19 DIAGNOSIS — O0990 Supervision of high risk pregnancy, unspecified, unspecified trimester: Secondary | ICD-10-CM | POA: Insufficient documentation

## 2020-10-19 DIAGNOSIS — Z8759 Personal history of other complications of pregnancy, childbirth and the puerperium: Secondary | ICD-10-CM | POA: Insufficient documentation

## 2020-10-19 DIAGNOSIS — O3662X0 Maternal care for excessive fetal growth, second trimester, not applicable or unspecified: Secondary | ICD-10-CM | POA: Insufficient documentation

## 2020-10-19 DIAGNOSIS — R768 Other specified abnormal immunological findings in serum: Secondary | ICD-10-CM | POA: Insufficient documentation

## 2020-10-20 ENCOUNTER — Other Ambulatory Visit: Payer: Self-pay | Admitting: *Deleted

## 2020-10-20 DIAGNOSIS — O09899 Supervision of other high risk pregnancies, unspecified trimester: Secondary | ICD-10-CM

## 2020-10-22 ENCOUNTER — Other Ambulatory Visit: Payer: Self-pay

## 2020-10-22 ENCOUNTER — Ambulatory Visit (INDEPENDENT_AMBULATORY_CARE_PROVIDER_SITE_OTHER): Payer: Medicaid Other | Admitting: Family Medicine

## 2020-10-22 VITALS — BP 117/76 | HR 91 | Wt 146.0 lb

## 2020-10-22 DIAGNOSIS — R8271 Bacteriuria: Secondary | ICD-10-CM

## 2020-10-22 DIAGNOSIS — O0990 Supervision of high risk pregnancy, unspecified, unspecified trimester: Secondary | ICD-10-CM

## 2020-10-22 DIAGNOSIS — O34219 Maternal care for unspecified type scar from previous cesarean delivery: Secondary | ICD-10-CM

## 2020-10-24 NOTE — Progress Notes (Signed)
   PRENATAL VISIT NOTE  Subjective:  Alexis Blanchard is a 25 y.o. [phone] at [redacted]w[redacted]d being seen today for ongoing prenatal care.  She is currently monitored for the following issues for this high-risk pregnancy and has Alpha thalassemia silent carrier; Group B streptococcal bacteriuria; History of severe pre-eclampsia; History of cesarean delivery affecting pregnancy; Supervision of high risk pregnancy, antepartum; False positive RPR test; ASCUS with positive high risk HPV cervical on 05/20/2020; COVID-19 affecting pregnancy in first trimester; and Excessive fetal growth affecting management of mother in second trimester, antepartum on their problem list.  Patient reports no complaints.  Contractions: Not present. Vag. Bleeding: None.  Movement: Present. Denies leaking of fluid.   The following portions of the patient's history were reviewed and updated as appropriate: allergies, current medications, past family history, past medical history, past social history, past surgical history and problem list.   Objective:   Vitals:   10/22/20 1052  BP: 117/76  Pulse: 91  Weight: 146 lb (66.2 kg)    Fetal Status: Fetal Heart Rate (bpm): 145   Movement: Present     General:  Alert, oriented and cooperative. Patient is in no acute distress.  Skin: Skin is warm and dry. No rash noted.   Cardiovascular: Normal heart rate noted  Respiratory: Normal respiratory effort, no problems with respiration noted  Abdomen: Soft, gravid, appropriate for gestational age.  Pain/Pressure: Absent     Pelvic: Cervical exam deferred        Extremities: Normal range of motion.  Edema: None  Mental Status: Normal mood and affect. Normal behavior. Normal judgment and thought content.   Assessment and Plan:  Pregnancy: [id number] at [redacted]w[redacted]d 1. Group B streptococcal bacteriuria Will need treatment in labor  2. History of cesarean delivery affecting pregnancy Desires TOLAC--consent signed  3. Supervision of high risk  pregnancy, antepartum Continue prenatal care.   Preterm labor symptoms and general obstetric precautions including but not limited to vaginal bleeding, contractions, leaking of fluid and fetal movement were reviewed in detail with the patient. Please refer to After Visit Summary for other counseling recommendations.   Return in 2 weeks (on 11/05/2020).  Future Appointments  Date Time Provider Department Center  11/05/2020  2:00 PM [place] Bing, MD [facility]  11/16/2020  2:45 PM [facility] NURSE [facility]  11/16/2020  3:00 PM [facility] US1 [facility]    Reva Bores, MD

## 2020-11-02 DIAGNOSIS — R69 Illness, unspecified: Secondary | ICD-10-CM | POA: Diagnosis not present

## 2020-11-03 ENCOUNTER — Other Ambulatory Visit: Payer: Self-pay

## 2020-11-03 ENCOUNTER — Inpatient Hospital Stay (HOSPITAL_COMMUNITY)
Admission: AD | Admit: 2020-11-03 | Discharge: 2020-11-03 | Disposition: A | Discharge status: Home / Self Care | Payer: Medicaid Other | Attending: Obstetrics and Gynecology | Admitting: Obstetrics and Gynecology

## 2020-11-03 ENCOUNTER — Encounter (HOSPITAL_COMMUNITY): Payer: Self-pay | Admitting: Obstetrics and Gynecology

## 2020-11-03 DIAGNOSIS — Z3A34 34 weeks gestation of pregnancy: Secondary | ICD-10-CM | POA: Diagnosis not present

## 2020-11-03 DIAGNOSIS — Z3A00 Weeks of gestation of pregnancy not specified: Secondary | ICD-10-CM

## 2020-11-03 DIAGNOSIS — O26899 Other specified pregnancy related conditions, unspecified trimester: Secondary | ICD-10-CM | POA: Diagnosis not present

## 2020-11-03 DIAGNOSIS — R102 Pelvic and perineal pain: Secondary | ICD-10-CM | POA: Diagnosis not present

## 2020-11-03 DIAGNOSIS — Z3689 Encounter for other specified antenatal screening: Secondary | ICD-10-CM

## 2020-11-03 DIAGNOSIS — O0990 Supervision of high risk pregnancy, unspecified, unspecified trimester: Secondary | ICD-10-CM

## 2020-11-03 DIAGNOSIS — O26893 Other specified pregnancy related conditions, third trimester: Secondary | ICD-10-CM | POA: Insufficient documentation

## 2020-11-03 DIAGNOSIS — R87810 Cervical high risk human papillomavirus (HPV) DNA test positive: Secondary | ICD-10-CM

## 2020-11-03 DIAGNOSIS — R768 Other specified abnormal immunological findings in serum: Secondary | ICD-10-CM

## 2020-11-03 DIAGNOSIS — Z8759 Personal history of other complications of pregnancy, childbirth and the puerperium: Secondary | ICD-10-CM

## 2020-11-03 DIAGNOSIS — O34219 Maternal care for unspecified type scar from previous cesarean delivery: Secondary | ICD-10-CM

## 2020-11-03 DIAGNOSIS — O98511 Other viral diseases complicating pregnancy, first trimester: Secondary | ICD-10-CM

## 2020-11-03 LAB — WET PREP, GENITAL
Clue Cells Wet Prep HPF POC: NONE SEEN
Sperm: NONE SEEN
Trich, Wet Prep: NONE SEEN
Yeast Wet Prep HPF POC: NONE SEEN

## 2020-11-03 LAB — GC/CHLAMYDIA PROBE AMP (~~LOC~~) NOT AT ARMC
Chlamydia: NEGATIVE
Comment: NEGATIVE
Comment: NORMAL
Neisseria Gonorrhea: NEGATIVE

## 2020-11-03 MED ORDER — CYCLOBENZAPRINE HCL 5 MG PO TABS
10.0000 mg | ORAL_TABLET | Freq: Once | ORAL | Status: AC
  Administered 2020-11-03: 10 mg via ORAL
  Filled 2020-11-03: qty 2

## 2020-11-03 MED ORDER — CYCLOBENZAPRINE HCL 10 MG PO TABS: 10.0000 mg | ORAL_TABLET | Freq: Three times a day (TID) | ORAL | 1 refills | Status: DC | PRN

## 2020-11-03 NOTE — Discharge Instructions (Signed)
Begin wearing a pregnancy belt daily while upright/walking around.  Get in with physical therapy - a referral has been sent. Can take Tylenol 500-1000mg  every 6hrs as needed for pain, can also take flexeril every 8hrs for pain. Sleep with a pillow between your knees and brace before standing/walking.  Round Ligament Massage & Stretches  Massage: Starting at the middle of your pubic bone, trace little circles in a wide U from your pubic bone to your hip bones on both sides.  Then starting just above your pubic bone, press in and down, alternating sides to create a gentle rocking of your uterus back and forth.  Move your hands up the sides of your belly and back down. Do this 3-5 times upon waking and before bed.  Stretches: Get on hands and knees and alternate arching your back deeply while inhaling, and then rounding your back while exhaling. Modified runners lunge:  - Sit on a chair with half of your bottom on the chair and half off.  - Sit up tall, plant your front foot, and stretch your other foot out behind you.  - Breathe deeply for 5 breaths and then do the other side.

## 2020-11-03 NOTE — MAU Note (Signed)
Pt says she has vag pain- x2 weeks - told Dr at [facility] Has arrived by EMS -  Says she called nurse line - told to come in.  Last sex- Friday-

## 2020-11-03 NOTE — MAU Provider Note (Signed)
Event Date/Time   First Provider Initiated Contact with Patient 11/03/20 0100   [name] is a 25 y.o. [phone] pregnant female who presents to MAU today with complaint of pubic symphysis and vaginal pain that radiates down into her upper thighs and is aggravated by movement and walking. This has been going on for the past two weeks but got worse tonight as she found it difficult to turn over in bed. Denies vaginal discharge or bleeding, is not sure if she is feeling contractions (but does say the pain intensifies sometimes, even when not moving). No other physical complaints.  Receives care at [facility], prenatal records reviewed.  Pertinent items noted in HPI and remainder of comprehensive ROS otherwise negative.  O BP 140/83 (BP Location: Right Arm)   Pulse (!) 111   Temp 98.8 F (37.1 C) (Oral)   Resp 20   Ht _0  (1.575 m)   Wt 150 lb 4.8 oz (68.2 kg)   LMP 03/09/2020   BMI 27.49 kg/m  Physical Exam Vitals and nursing note reviewed. Exam conducted with a chaperone present.  Constitutional:      General: She is not in acute distress.    Appearance: Normal appearance. She is not ill-appearing.  HENT:     Head: Normocephalic and atraumatic.  Eyes:     Pupils: Pupils are equal, round, and reactive to light.  Cardiovascular:     Rate and Rhythm: Normal rate.  Pulmonary:     Effort: Pulmonary effort is normal.  Genitourinary:    General: Normal vulva.     Comments: Dilation: Closed Effacement (%): Thick Cervical Position: Posterior Station: -1 Presentation: Vertex Exam by:: Alexis Blanchard, CNM  Musculoskeletal:        General: Tenderness (to pubic symphysis with mild palpation) present. Normal range of motion.  Skin:    General: Skin is warm.     Capillary Refill: Capillary refill takes less than 2 seconds.  Neurological:     Mental Status: She is alert and oriented to person, place, and time.  Psychiatric:        Mood and Affect: Mood normal.         Behavior: Behavior normal.        Thought Content: Thought content normal.        Judgment: Judgment normal.   MAU Course & MDM:  Wet prep and GC/CT collected, wet prep normal Flexeril ordered with some relief, pain better but still present with movement. Explained that this will take time, physical therapy and a maternity belt to ease and may continue through the end of her pregnancy Education given on use of maternity belt to help relieve pelvic pain in late pregnancy, especially given low station of baby's head. Pt expressed understanding.  Results for orders placed or performed during the hospital encounter of 11/03/20 (from the past 24 hour(s))  Wet prep, genital     Status: Abnormal   Collection Time: 11/03/20  1:16 AM  Result Value Ref Range   Yeast Wet Prep HPF POC NONE SEEN NONE SEEN   Trich, Wet Prep NONE SEEN NONE SEEN   Clue Cells Wet Prep HPF POC NONE SEEN NONE SEEN   WBC, Wet Prep HPF POC MANY (A) NONE SEEN   Sperm NONE SEEN    A Pelvic pain in third trimester of pregnancy NST reactive  P Discharge from MAU in stable condition with preterm labor precautions Follow up at [place] as scheduled for ongoing prenatal care See  AVS for additional education  [name] for [employer] at [facility]. Go to.   Specialty: Obstetrics and Gynecology Why: as scheduled for ongoing prenatal care, also get in with pelvic floor physical therapy to help ease pelvic pain Contact information: [place] [phone]               Allergies as of 11/03/2020   No Known Allergies      Medication List     STOP taking these medications    cetirizine 10 MG tablet Commonly known as: ZyrTEC Allergy   Doxylamine-Pyridoxine 10-10 MG Tbec Commonly known as: Diclegis   famotidine 20 MG tablet Commonly known as: PEPCID   promethazine 25 MG tablet Commonly known as: PHENERGAN   pseudoephedrine 30 MG  tablet Commonly known as: SUDAFED       TAKE these medications    aspirin EC 81 MG tablet Take 1 tablet (81 mg total) by mouth daily. Take after 12 weeks for prevention of preeclampsia later in pregnancy   Blood Pressure Kit Devi 1 Device by Does not apply route once a week. To be monitored weekly from home   Concept OB 130-92.4-1 MG Caps Take 1 tablet by mouth daily.   cyclobenzaprine 10 MG tablet Commonly known as: FLEXERIL Take 1 tablet (10 mg total) by mouth every 8 (eight) hours as needed for muscle spasms.       Gabriel Carina, CNM 11/03/2020 1:39 AM

## 2020-11-05 ENCOUNTER — Encounter: Payer: Self-pay | Admitting: Obstetrics and Gynecology

## 2020-11-05 ENCOUNTER — Other Ambulatory Visit: Payer: Self-pay

## 2020-11-05 ENCOUNTER — Ambulatory Visit (INDEPENDENT_AMBULATORY_CARE_PROVIDER_SITE_OTHER): Payer: Medicaid Other | Admitting: Obstetrics and Gynecology

## 2020-11-05 VITALS — BP 127/75 | HR 99 | Wt 150.0 lb

## 2020-11-05 DIAGNOSIS — O34219 Maternal care for unspecified type scar from previous cesarean delivery: Secondary | ICD-10-CM

## 2020-11-05 DIAGNOSIS — Z8759 Personal history of other complications of pregnancy, childbirth and the puerperium: Secondary | ICD-10-CM

## 2020-11-05 DIAGNOSIS — O0990 Supervision of high risk pregnancy, unspecified, unspecified trimester: Secondary | ICD-10-CM

## 2020-11-05 LAB — POCT URINALYSIS DIPSTICK: Protein, UA: NEGATIVE

## 2020-11-05 NOTE — Progress Notes (Signed)
    PRENATAL VISIT NOTE  Subjective:  Alexis Blanchard is a 25 y.o. [phone] at [redacted]w[redacted]d being seen today for ongoing prenatal care.  She is currently monitored for the following issues for this high-risk pregnancy and has Alpha thalassemia silent carrier; Group B streptococcal bacteriuria; History of severe pre-eclampsia; History of cesarean delivery affecting pregnancy; Supervision of high risk pregnancy, antepartum; ASCUS with positive high risk HPV cervical on 05/20/2020; and COVID-19 affecting pregnancy in first trimester on their problem list.  Patient reports no complaints.  Contractions: Not present. Vag. Bleeding: None.  Movement: Present. Denies leaking of fluid.   The following portions of the patient's history were reviewed and updated as appropriate: allergies, current medications, past family history, past medical history, past social history, past surgical history and problem list.   Objective:   Vitals:   11/05/20 1403  BP: 127/75  Pulse: 99  Weight: 150 lb (68 kg)    Fetal Status: Fetal Heart Rate (bpm): 145   Movement: Present     General:  Alert, oriented and cooperative. Patient is in no acute distress.  Skin: Skin is warm and dry. No rash noted.   Cardiovascular: Normal heart rate noted  Respiratory: Normal respiratory effort, no problems with respiration noted  Abdomen: Soft, gravid, appropriate for gestational age.  Pain/Pressure: Present     Pelvic: Cervical exam deferred        Extremities: Normal range of motion.  Edema: None  Mental Status: Normal mood and affect. Normal behavior. Normal judgment and thought content.   Assessment and Plan:  Pregnancy: [id number] at [redacted]w[redacted]d 1. Supervision of high risk pregnancy, antepartum Routine care. Nexplanon. - POCT Urinalysis Dipstick  2. History of severe pre-eclampsia U dip negative. F/u rpt growth u/s on 7/11 6/13: afi 11, 63%, 2032gm, ac 71%  3. History of cesarean delivery affecting pregnancy Tolac form signed.  Prior LTCS for breech  Preterm labor symptoms and general obstetric precautions including but not limited to vaginal bleeding, contractions, leaking of fluid and fetal movement were reviewed in detail with the patient. Please refer to After Visit Summary for other counseling recommendations.   Return in about 2 weeks (around 11/19/2020) for md or app, high risk ob, in person.  Future Appointments  Date Time Provider Department Center  11/16/2020  2:45 PM [facility] NURSE [facility]  11/16/2020  3:00 PM [facility] US1 [facility]  11/23/2020  9:30 AM Barbaraann Faster, PT [facility]  11/24/2020  2:45 PM Constant, Gigi Gin, MD [facility]    [place] Bing, MD

## 2020-11-16 ENCOUNTER — Ambulatory Visit: Payer: Medicaid Other | Admitting: *Deleted

## 2020-11-16 ENCOUNTER — Encounter: Payer: Self-pay | Admitting: *Deleted

## 2020-11-16 ENCOUNTER — Other Ambulatory Visit: Payer: Self-pay

## 2020-11-16 ENCOUNTER — Ambulatory Visit: Payer: Medicaid Other | Attending: Obstetrics and Gynecology

## 2020-11-16 VITALS — BP 137/78 | HR 93

## 2020-11-16 DIAGNOSIS — R87610 Atypical squamous cells of undetermined significance on cytologic smear of cervix (ASC-US): Secondary | ICD-10-CM | POA: Diagnosis not present

## 2020-11-16 DIAGNOSIS — O0990 Supervision of high risk pregnancy, unspecified, unspecified trimester: Secondary | ICD-10-CM | POA: Diagnosis not present

## 2020-11-16 DIAGNOSIS — R87810 Cervical high risk human papillomavirus (HPV) DNA test positive: Secondary | ICD-10-CM | POA: Diagnosis not present

## 2020-11-16 DIAGNOSIS — O98511 Other viral diseases complicating pregnancy, first trimester: Secondary | ICD-10-CM

## 2020-11-16 DIAGNOSIS — O09899 Supervision of other high risk pregnancies, unspecified trimester: Secondary | ICD-10-CM | POA: Diagnosis not present

## 2020-11-16 DIAGNOSIS — Z8759 Personal history of other complications of pregnancy, childbirth and the puerperium: Secondary | ICD-10-CM | POA: Insufficient documentation

## 2020-11-16 DIAGNOSIS — O34219 Maternal care for unspecified type scar from previous cesarean delivery: Secondary | ICD-10-CM | POA: Insufficient documentation

## 2020-11-16 DIAGNOSIS — U071 COVID-19: Secondary | ICD-10-CM | POA: Insufficient documentation

## 2020-11-18 ENCOUNTER — Inpatient Hospital Stay (HOSPITAL_COMMUNITY)
Admission: AD | Admit: 2020-11-18 | Discharge: 2020-11-19 | Disposition: A | Discharge status: Home / Self Care | Payer: Medicaid Other | Attending: Obstetrics & Gynecology | Admitting: Obstetrics & Gynecology

## 2020-11-18 ENCOUNTER — Encounter (HOSPITAL_COMMUNITY): Payer: Self-pay | Admitting: Obstetrics & Gynecology

## 2020-11-18 DIAGNOSIS — O10911 Unspecified pre-existing hypertension complicating pregnancy, first trimester: Secondary | ICD-10-CM | POA: Diagnosis present

## 2020-11-18 DIAGNOSIS — O99013 Anemia complicating pregnancy, third trimester: Secondary | ICD-10-CM | POA: Diagnosis not present

## 2020-11-18 DIAGNOSIS — D560 Alpha thalassemia: Secondary | ICD-10-CM | POA: Diagnosis not present

## 2020-11-18 DIAGNOSIS — O139 Gestational [pregnancy-induced] hypertension without significant proteinuria, unspecified trimester: Secondary | ICD-10-CM | POA: Diagnosis present

## 2020-11-18 DIAGNOSIS — Z7982 Long term (current) use of aspirin: Secondary | ICD-10-CM | POA: Diagnosis not present

## 2020-11-18 DIAGNOSIS — O163 Unspecified maternal hypertension, third trimester: Secondary | ICD-10-CM | POA: Insufficient documentation

## 2020-11-18 DIAGNOSIS — Z3A36 36 weeks gestation of pregnancy: Secondary | ICD-10-CM | POA: Insufficient documentation

## 2020-11-18 DIAGNOSIS — O133 Gestational [pregnancy-induced] hypertension without significant proteinuria, third trimester: Secondary | ICD-10-CM | POA: Diagnosis not present

## 2020-11-18 DIAGNOSIS — O479 False labor, unspecified: Secondary | ICD-10-CM

## 2020-11-18 DIAGNOSIS — O4703 False labor before 37 completed weeks of gestation, third trimester: Secondary | ICD-10-CM | POA: Diagnosis not present

## 2020-11-18 LAB — COMPREHENSIVE METABOLIC PANEL WITH GFR
ALT: 13 U/L (ref 0–44)
AST: 23 U/L (ref 15–41)
Albumin: 2.5 g/dL — ABNORMAL LOW (ref 3.5–5.0)
Alkaline Phosphatase: 124 U/L (ref 38–126)
Anion gap: 7 (ref 5–15)
BUN: <5 mg/dL — ABNORMAL LOW (ref 6–20)
CO2: 20 mmol/L — ABNORMAL LOW (ref 22–32)
Calcium: 8.8 mg/dL — ABNORMAL LOW (ref 8.9–10.3)
Chloride: 109 mmol/L (ref 98–111)
Creatinine, Ser: 0.58 mg/dL (ref 0.44–1.00)
GFR, Estimated: >60 mL/min
Glucose, Bld: 96 mg/dL (ref 70–99)
Potassium: 4.1 mmol/L (ref 3.5–5.1)
Sodium: 136 mmol/L (ref 135–145)
Total Bilirubin: 0.7 mg/dL (ref 0.3–1.2)
Total Protein: 5.5 g/dL — ABNORMAL LOW (ref 6.5–8.1)

## 2020-11-18 LAB — URINALYSIS, ROUTINE W REFLEX MICROSCOPIC
Bilirubin Urine: NEGATIVE
Glucose, UA: NEGATIVE mg/dL
Hgb urine dipstick: NEGATIVE
Ketones, ur: NEGATIVE mg/dL
Leukocytes,Ua: NEGATIVE
Nitrite: NEGATIVE
Protein, ur: NEGATIVE mg/dL
Specific Gravity, Urine: 1.01 (ref 1.005–1.030)
pH: 7 (ref 5.0–8.0)

## 2020-11-18 LAB — CBC
HCT: 39 % (ref 36.0–46.0)
Hemoglobin: 13.3 g/dL (ref 12.0–15.0)
MCH: 27.7 pg (ref 26.0–34.0)
MCHC: 34.1 g/dL (ref 30.0–36.0)
MCV: 81.1 fL (ref 80.0–100.0)
Platelets: 321 K/uL (ref 150–400)
RBC: 4.81 MIL/uL (ref 3.87–5.11)
RDW: 15.5 % (ref 11.5–15.5)
WBC: 7.9 K/uL (ref 4.0–10.5)
nRBC: 0 % (ref 0.0–0.2)

## 2020-11-18 LAB — PROTEIN / CREATININE RATIO, URINE
Creatinine, Urine: 55.51 mg/dL
Protein Creatinine Ratio: 0.16 mg/mg{Cre} — ABNORMAL HIGH (ref 0.00–0.15)
Total Protein, Urine: 9 mg/dL

## 2020-11-18 NOTE — MAU Note (Signed)
..  Alexis Blanchard is a 25 y.o. at [redacted]w[redacted]d here in MAU reporting: contractions that began an hour ago that are 10 minutes apart. Denies vaginal bleeding or leaking fluid. +FM.   Pain score: 6/10 Vitals:   11/18/20 2206  BP: 139/88  Pulse: 90  Resp: 20  Temp: 98.1 F (36.7 C)  SpO2: 99%     FHT:148

## 2020-11-18 NOTE — MAU Provider Note (Signed)
Chief Complaint:  Labor Eval   Event Date/Time   First Provider Initiated Contact with Patient 11/18/20 2212     HPI: Alexis Blanchard is a 25 y.o. [phone] at 70w2dho presents to maternity admissions reporting painful contractions.  She was noted to be hypertensive, so I was asked to see her. . She reports good fetal movement, denies LOF, vaginal bleeding, vaginal itching/burning, urinary symptoms, h/a, dizziness, n/v, diarrhea, constipation or fever/chills.  She denies headache, visual changes or RUQ abdominal pain.  Hypertension This is a recurrent problem. The problem has been gradually worsening since onset. Pertinent negatives include no anxiety, blurred vision, chest pain, headaches, malaise/fatigue, peripheral edema or shortness of breath. There are no associated agents to hypertension. Risk factors: history of severe preeclampsia. Past treatments include nothing. There are no compliance problems.    RN note: NGREGORIA SELVYis a 24y.o. at 329w2dere in MAU reporting: contractions that began an hour ago that are 10 minutes apart. Denies vaginal bleeding or leaking fluid. +FM.   Pain score: 6/10  Past Medical History: Past Medical History:  Diagnosis Date   Excessive fetal growth affecting management of mother in second trimester, antepartum 10/06/2020   _0  f/u rpt growth Normal 2h GTT  4/12: 92%, 684g, ac 64%, FL 94%, nl AFI   False positive RPR test 05/20/2020   Negative T.pallidium confirmatory test   Severe preeclampsia    Delivered at 34 weeks   Syphilis     Past obstetric history: OB History  Gravida Para Term Preterm AB Living  4 1 0 _1 SAB IAB Ectopic Multiple Live Births  2 0 0 0 1    # Outcome Date GA Lbr Len/2nd Weight Sex Delivery Anes PTL Lv  4 Current           3 SAB 04/2019          2 Preterm 06/08/17 [id number] g M CS-LTranv Spinal  LIV     Birth Comments: Severe preeclampsia, Breech      Complications: Severe preeclampsia, Breech presentation  1  SAB             Past Surgical History: Past Surgical History:  Procedure Laterality Date   CESAREAN SECTION N/A 06/08/2017   Procedure: CESAREAN SECTION;  Surgeon: HarLavonia DraftsD;  [facility];  Laterality: N/A;   TONSILLECTOMY      Family History: Family History  Problem Relation Age of Onset   Hypertension Mother    Heart disease Maternal Aunt        CHF   Cancer Maternal Aunt 45       colon   Cancer Maternal Aunt 45       throat   Congestive Heart Failure Maternal Uncle    Diabetes Maternal Grandmother    Hypertension Maternal Grandmother    Heart disease Maternal Grandmother     Social History: Social History   Tobacco Use   Smoking status: Never   Smokeless tobacco: Never  Vaping Use   Vaping Use: Never used  Substance Use Topics   Alcohol use: Not Currently    Comment: pt states that she has not used since finding out about the pregnancy   Drug use: Not Currently    Types: Marijuana    Comment: pt states that she has not used since finding out about the pregnancy    Allergies: No Known Allergies  Meds:  Medications Prior to  Admission  Medication Sig Dispense Refill Last Dose   aspirin EC 81 MG tablet Take 1 tablet (81 mg total) by mouth daily. Take after 12 weeks for prevention of preeclampsia later in pregnancy 300 tablet 2    Blood Pressure Monitoring (BLOOD PRESSURE KIT) DEVI 1 Device by Does not apply route once a week. To be monitored weekly from home 1 each 0    cyclobenzaprine (FLEXERIL) 10 MG tablet Take 1 tablet (10 mg total) by mouth every 8 (eight) hours as needed for muscle spasms. 30 tablet 1    Prenat w/o A Vit-FeFum-FePo-FA (CONCEPT OB) 130-92.4-1 MG CAPS Take 1 tablet by mouth daily. 30 capsule 12     I have reviewed patient's Past Medical Hx, Surgical Hx, Family Hx, Social Hx, medications and allergies.   ROS:  Review of Systems  Constitutional:  Negative for malaise/fatigue.  Eyes:   Negative for blurred vision.  Respiratory:  Negative for shortness of breath.   Cardiovascular:  Negative for chest pain.  Neurological:  Negative for headaches.  Other systems negative  Physical Exam  Patient Vitals for the past 24 hrs:  BP Temp Temp src Pulse Resp SpO2  11/18/20 2206 139/88 98.1 F (36.7 C) Oral 90 20 99 %   Vitals:   11/18/20 2301 11/18/20 2316 11/18/20 2331 11/18/20 2346  BP: (!) 136/95 (!) 137/94 (!) 146/100 138/81  Pulse: (!) 104 (!) 107 90 97  Resp:      Temp:      TempSrc:      SpO2:        Constitutional: Well-developed, well-nourished female in no acute distress.  Cardiovascular: normal rate and rhythm Respiratory: normal effort, clear to auscultation bilaterally GI: Abd soft, non-tender, gravid appropriate for gestational age.   No rebound or guarding. MS: Extremities nontender, no edema, normal ROM Neurologic: Alert and oriented x 4.   DTRs 2+ with no clonus GU: Neg CVAT.  PELVIC EXAM:   Dilation: Fingertip Effacement (%): 60 Station: -3 Exam by:: Hansel Feinstein, CNM  FHT:  Baseline 140 , moderate variability, accelerations present, no decelerations Contractions:  Irregular     Labs: Results for orders placed or performed during the hospital encounter of 11/18/20 (from the past 24 hour(s))  Protein / creatinine ratio, urine     Status: Abnormal   Collection Time: 11/18/20 10:28 PM  Result Value Ref Range   Creatinine, Urine 55.51 mg/dL   Total Protein, Urine 9 mg/dL   Protein Creatinine Ratio 0.16 (H) 0.00 - 0.15 mg/mg[Cre]  Urinalysis, Routine w reflex microscopic Urine, Clean Catch     Status: None   Collection Time: 11/18/20 10:28 PM  Result Value Ref Range   Color, Urine YELLOW YELLOW   APPearance CLEAR CLEAR   Specific Gravity, Urine 1.010 1.005 - 1.030   pH 7.0 5.0 - 8.0   Glucose, UA NEGATIVE NEGATIVE mg/dL   Hgb urine dipstick NEGATIVE NEGATIVE   Bilirubin Urine NEGATIVE NEGATIVE   Ketones, ur NEGATIVE NEGATIVE mg/dL    Protein, ur NEGATIVE NEGATIVE mg/dL   Nitrite NEGATIVE NEGATIVE   Leukocytes,Ua NEGATIVE NEGATIVE  CBC     Status: None   Collection Time: 11/18/20 10:37 PM  Result Value Ref Range   WBC 7.9 4.0 - 10.5 K/uL   RBC 4.81 3.87 - 5.11 MIL/uL   Hemoglobin 13.3 12.0 - 15.0 g/dL   HCT 39.0 36.0 - 46.0 %   MCV 81.1 80.0 - 100.0 fL   MCH 27.7 26.0 - 34.0 pg  MCHC 34.1 30.0 - 36.0 g/dL   RDW 15.5 11.5 - 15.5 %   Platelets 321 150 - 400 K/uL   nRBC 0.0 0.0 - 0.2 %  Comprehensive metabolic panel     Status: Abnormal   Collection Time: 11/18/20 10:37 PM  Result Value Ref Range   Sodium 136 135 - 145 mmol/L   Potassium 4.1 3.5 - 5.1 mmol/L   Chloride 109 98 - 111 mmol/L   CO2 20 (L) 22 - 32 mmol/L   Glucose, Bld 96 70 - 99 mg/dL   BUN <5 (L) 6 - 20 mg/dL   Creatinine, Ser 0.58 0.44 - 1.00 mg/dL   Calcium 8.8 (L) 8.9 - 10.3 mg/dL   Total Protein 5.5 (L) 6.5 - 8.1 g/dL   Albumin 2.5 (L) 3.5 - 5.0 g/dL   AST 23 15 - 41 U/L   ALT 13 0 - 44 U/L   Alkaline Phosphatase 124 38 - 126 U/L   Total Bilirubin 0.7 0.3 - 1.2 mg/dL   GFR, Estimated >60 >60 mL/min   Anion gap 7 5 - 15    B/Positive/-- (01/11 1516)  Imaging:    MAU Course/MDM: I have ordered labs and reviewed results. These are normal  NST reviewed, reactive Consult Dr Nelda Marseille with presentation, exam findings and test results. She recommends BP check Friday (message sent) and IOL at 37 weeks Treatments in MAU included EFM.    Assessment: SIngle IUP at 82w3dIrregular uterine contractions, not in labor Gestational Hypertension, meets criteria  Plan: Discharge home Preeclampsia precautions Labor precautions and fetal kick counts Follow up in Office for prenatal visits and recheck of BP Encouraged to return if she develops worsening of symptoms, increase in pain, fever, or other concerning symptoms.  Pt stable at time of discharge.  MHansel FeinsteinCNM, MSN Certified Nurse-Midwife 11/18/2020 10:21 PM

## 2020-11-19 DIAGNOSIS — Z3A36 36 weeks gestation of pregnancy: Secondary | ICD-10-CM

## 2020-11-19 DIAGNOSIS — O4703 False labor before 37 completed weeks of gestation, third trimester: Secondary | ICD-10-CM

## 2020-11-19 DIAGNOSIS — O133 Gestational [pregnancy-induced] hypertension without significant proteinuria, third trimester: Secondary | ICD-10-CM | POA: Diagnosis not present

## 2020-11-20 ENCOUNTER — Encounter (HOSPITAL_COMMUNITY): Payer: Self-pay | Admitting: Obstetrics & Gynecology

## 2020-11-20 ENCOUNTER — Other Ambulatory Visit: Payer: Self-pay

## 2020-11-20 ENCOUNTER — Inpatient Hospital Stay (HOSPITAL_COMMUNITY)
Admission: AD | Admit: 2020-11-20 | Discharge: 2020-11-20 | Disposition: A | Discharge status: Home / Self Care | Payer: Medicaid Other | Attending: Obstetrics & Gynecology | Admitting: Obstetrics & Gynecology

## 2020-11-20 ENCOUNTER — Ambulatory Visit (INDEPENDENT_AMBULATORY_CARE_PROVIDER_SITE_OTHER): Payer: Medicaid Other

## 2020-11-20 VITALS — BP 156/108

## 2020-11-20 DIAGNOSIS — Z7982 Long term (current) use of aspirin: Secondary | ICD-10-CM | POA: Insufficient documentation

## 2020-11-20 DIAGNOSIS — O133 Gestational [pregnancy-induced] hypertension without significant proteinuria, third trimester: Secondary | ICD-10-CM | POA: Insufficient documentation

## 2020-11-20 DIAGNOSIS — Z3A36 36 weeks gestation of pregnancy: Secondary | ICD-10-CM | POA: Insufficient documentation

## 2020-11-20 DIAGNOSIS — O26893 Other specified pregnancy related conditions, third trimester: Secondary | ICD-10-CM | POA: Diagnosis not present

## 2020-11-20 DIAGNOSIS — O99013 Anemia complicating pregnancy, third trimester: Secondary | ICD-10-CM | POA: Diagnosis not present

## 2020-11-20 DIAGNOSIS — D563 Thalassemia minor: Secondary | ICD-10-CM | POA: Insufficient documentation

## 2020-11-20 DIAGNOSIS — D560 Alpha thalassemia: Secondary | ICD-10-CM | POA: Diagnosis not present

## 2020-11-20 HISTORY — DX: Gestational diabetes mellitus in pregnancy, unspecified control: O24.419

## 2020-11-20 HISTORY — DX: Urinary tract infection, site not specified: N39.0

## 2020-11-20 LAB — URINALYSIS, ROUTINE W REFLEX MICROSCOPIC
Bilirubin Urine: NEGATIVE
Glucose, UA: NEGATIVE mg/dL
Hgb urine dipstick: NEGATIVE
Ketones, ur: NEGATIVE mg/dL
Leukocytes,Ua: NEGATIVE
Nitrite: NEGATIVE
Protein, ur: NEGATIVE mg/dL
Specific Gravity, Urine: 1.008 (ref 1.005–1.030)
pH: 7 (ref 5.0–8.0)

## 2020-11-20 LAB — COMPREHENSIVE METABOLIC PANEL
ALT: 14 U/L (ref 0–44)
AST: 19 U/L (ref 15–41)
Albumin: 2.7 g/dL — ABNORMAL LOW (ref 3.5–5.0)
Alkaline Phosphatase: 125 U/L (ref 38–126)
Anion gap: 7 (ref 5–15)
BUN: 6 mg/dL (ref 6–20)
CO2: 24 mmol/L (ref 22–32)
Calcium: 9.1 mg/dL (ref 8.9–10.3)
Chloride: 105 mmol/L (ref 98–111)
Creatinine, Ser: 0.6 mg/dL (ref 0.44–1.00)
GFR, Estimated: >60 mL/min (ref >60)
Glucose, Bld: 76 mg/dL (ref 70–99)
Potassium: 4 mmol/L (ref 3.5–5.1)
Sodium: 136 mmol/L (ref 135–145)
Total Bilirubin: 0.5 mg/dL (ref 0.3–1.2)
Total Protein: 5.9 g/dL — ABNORMAL LOW (ref 6.5–8.1)

## 2020-11-20 LAB — CBC
HCT: 40.1 % (ref 36.0–46.0)
Hemoglobin: 13.2 g/dL (ref 12.0–15.0)
MCH: 27 pg (ref 26.0–34.0)
MCHC: 32.9 g/dL (ref 30.0–36.0)
MCV: 82 fL (ref 80.0–100.0)
Platelets: 314 10*3/uL (ref 150–400)
RBC: 4.89 MIL/uL (ref 3.87–5.11)
RDW: 15.6 % — ABNORMAL HIGH (ref 11.5–15.5)
WBC: 8.1 10*3/uL (ref 4.0–10.5)
nRBC: 0 % (ref 0.0–0.2)

## 2020-11-20 LAB — PROTEIN / CREATININE RATIO, URINE
Creatinine, Urine: 37.46 mg/dL
Total Protein, Urine: <6 mg/dL

## 2020-11-20 NOTE — MAU Provider Note (Signed)
History     CSN: [id number]  Arrival date and time: 11/20/20 1002   Event Date/Time   First Provider Initiated Contact with Patient 11/20/20 1051      Chief Complaint  Patient presents with   Hypertension   HPI  Ms.Alexis Blanchard is a 25 y.o.female [phone] @ [redacted]w[redacted]d here in MAU with complaints of elevated BP. She has a diagnoses of GHTN and plans for an induction at 37 weeks. [facility] she was sent here for further evaluation. She denies headaches for scotoma. + fetal movement.   OB History     Gravida  4   Para  1   Term  0   Preterm  1   AB  2   Living  1      SAB  2   IAB  0   Ectopic  0   Multiple  0   Live Births  1           Past Medical History:  Diagnosis Date   Excessive fetal growth affecting management of mother in second trimester, antepartum 10/06/2020   _0  f/u rpt growth Normal 2h GTT  4/12: 92%, 684g, ac 64%, FL 94%, nl AFI   False positive RPR test 05/20/2020   Negative T.pallidium confirmatory test   Gestational diabetes    first preg   Severe preeclampsia    Delivered at 34 weeks   Syphilis    UTI (urinary tract infection)     Past Surgical History:  Procedure Laterality Date   CESAREAN SECTION N/A 06/08/2017   Procedure: CESAREAN SECTION;  Surgeon: HLavonia Drafts MD;  [facility]  Service: Obstetrics;  Laterality: N/A;   TONSILLECTOMY      Family History  Problem Relation Age of Onset   Hypertension Mother    Heart disease Maternal Aunt        CHF   Cancer Maternal Aunt 45       colon   Cancer Maternal Aunt 45       throat   Congestive Heart Failure Maternal Uncle    Diabetes Maternal Grandmother    Hypertension Maternal Grandmother    Heart disease Maternal Grandmother     Social History   Tobacco Use   Smoking status: Never   Smokeless tobacco: Never  Vaping Use   Vaping Use: Never used  Substance Use Topics   Alcohol use: Not  Currently    Comment: pt states that she has not used since finding out about the pregnancy   Drug use: Not Currently    Types: Marijuana    Comment: pt states that she has not used since finding out about the pregnancy    Allergies: No Known Allergies  Medications Prior to Admission  Medication Sig Dispense Refill Last Dose   cyclobenzaprine (FLEXERIL) 10 MG tablet Take 1 tablet (10 mg total) by mouth every 8 (eight) hours as needed for muscle spasms. 30 tablet 1 11/19/2020   Prenat w/o A Vit-FeFum-FePo-FA (CONCEPT OB) 130-92.4-1 MG CAPS Take 1 tablet by mouth daily. 30 capsule 12 11/19/2020   aspirin EC 81 MG tablet Take 1 tablet (81 mg total) by mouth daily. Take after 12 weeks for prevention of preeclampsia later in pregnancy 300 tablet 2 11/16/2020   Blood Pressure Monitoring (BLOOD PRESSURE KIT) DEVI 1 Device by Does not apply route once a week. To be monitored weekly from home 1  each 0    Results for orders placed or performed during the hospital encounter of 11/20/20 (from the past 48 hour(s))  Protein / creatinine ratio, urine     Status: None   Collection Time: 11/20/20 10:02 AM  Result Value Ref Range   Creatinine, Urine 37.46 mg/dL   Total Protein, Urine <6.0 mg/dL    Comment: NO NORMAL RANGE ESTABLISHED FOR THIS TEST   Protein Creatinine Ratio        0.00 - 0.15 mg/mg[Cre]    Comment: RESULT BELOW REPORTABLE RANGE, UNABLE TO CALCULATE. Performed at [facility], [place] [address], [place], [place] [address]   Urinalysis, Routine w reflex microscopic Urine, Clean Catch     Status: Abnormal   Collection Time: 11/20/20 10:02 AM  Result Value Ref Range   Color, Urine STRAW (A) YELLOW   APPearance CLEAR CLEAR   Specific Gravity, Urine 1.008 1.005 - 1.030   pH 7.0 5.0 - 8.0   Glucose, UA NEGATIVE NEGATIVE mg/dL   Hgb urine dipstick NEGATIVE NEGATIVE   Bilirubin Urine NEGATIVE NEGATIVE   Ketones, ur NEGATIVE NEGATIVE mg/dL   Protein, ur NEGATIVE NEGATIVE mg/dL   Nitrite  NEGATIVE NEGATIVE   Leukocytes,Ua NEGATIVE NEGATIVE    Comment: Performed at [facility] [address] [place], [place] [address]  CBC     Status: Abnormal   Collection Time: 11/20/20 10:18 AM  Result Value Ref Range   WBC 8.1 4.0 - 10.5 K/uL   RBC 4.89 3.87 - 5.11 MIL/uL   Hemoglobin 13.2 12.0 - 15.0 g/dL   HCT 40.1 36.0 - 46.0 %   MCV 82.0 80.0 - 100.0 fL   MCH 27.0 26.0 - 34.0 pg   MCHC 32.9 30.0 - 36.0 g/dL   RDW 15.6 (H) 11.5 - 15.5 %   Platelets 314 150 - 400 K/uL   nRBC 0.0 0.0 - 0.2 %    Comment: Performed at [facility], [place] [address] [place], [place] [address]  Comprehensive metabolic panel     Status: Abnormal   Collection Time: 11/20/20 10:18 AM  Result Value Ref Range   Sodium 136 135 - 145 mmol/L   Potassium 4.0 3.5 - 5.1 mmol/L   Chloride 105 98 - 111 mmol/L   CO2 24 22 - 32 mmol/L   Glucose, Bld 76 70 - 99 mg/dL    Comment: Glucose reference range applies only to samples taken after fasting for at least 8 hours.   BUN 6 6 - 20 mg/dL   Creatinine, Ser 0.60 0.44 - 1.00 mg/dL   Calcium 9.1 8.9 - 10.3 mg/dL   Total Protein 5.9 (L) 6.5 - 8.1 g/dL   Albumin 2.7 (L) 3.5 - 5.0 g/dL   AST 19 15 - 41 U/L   ALT 14 0 - 44 U/L   Alkaline Phosphatase 125 38 - 126 U/L   Total Bilirubin 0.5 0.3 - 1.2 mg/dL   GFR, Estimated >60 >60 mL/min    Comment: (NOTE) Calculated using the CKD-EPI Creatinine Equation (2021)    Anion gap 7 5 - 15    Comment: Performed at [facility], [place] [address] [place], [place] [address]    Review of Systems  Eyes:  Negative for photophobia and visual disturbance.  Neurological:  Negative for headaches.  Physical Exam   Blood pressure 131/87, pulse 99, temperature 98.3 F (36.8 C), temperature source Oral, resp. rate 18, height _0  (1.575 m), weight 70.9  kg, last menstrual period 03/09/2020, SpO2 98 %.  Patient Vitals for the past 24 hrs:  BP Temp Temp src Pulse Resp SpO2 Height Weight  11/20/20 1130 131/87 -- --  99 -- 98 % -- --  11/20/20 1115 (!) 144/86 -- -- 97 -- 98 % -- --  11/20/20 1100 135/84 -- -- 98 -- 99 % -- --  11/20/20 1045 (!) 150/91 -- -- 99 -- 99 % -- --  11/20/20 1035 (!) 144/82 -- -- 94 -- 99 % -- --  11/20/20 1016 (!) 155/101 98.3 F (36.8 C) Oral 90 18 100 % _0  (1.575 m) 70.9 kg     Physical Exam Constitutional:      General: She is not in acute distress.    Appearance: Normal appearance. She is not ill-appearing, toxic-appearing or diaphoretic.  HENT:     Head: Normocephalic.  Musculoskeletal:        General: Normal range of motion.  Neurological:     Mental Status: She is alert and oriented to person, place, and time.     Deep Tendon Reflexes: Reflexes abnormal.     Comments: Negative clonus.   Psychiatric:        Behavior: Behavior normal.   Fetal Tracing: Baseline: 130 bpm Variability: Moderate  Accelerations: 15x15 Decelerations: None Toco:  Occasional.   MAU Course  Procedures None  MDM  BP's non severe range and patient is without signs of pre E. Message sent to the office and her induction is scheduled for 7/18 for her to arrive at 1145 pm. She is aware of the plan.   Assessment and Plan   A:  1. Gestational hypertension, third trimester   2. [redacted] weeks gestation of pregnancy   3. Alpha thalassemia silent carrier      P:  Discharge home with strict return precautions. Induction scheduled for Monday. Kick counts Pre E precautions  Tarisha Fader, Artist Pais, NP 11/20/2020 2:40 PM

## 2020-11-20 NOTE — MAU Note (Signed)
Sent from office. BP elevated.  Pt denies HA,visual changes, epigastric pain or increase in swelling.  Denies pain, bleeding or ROM. Reports +FM

## 2020-11-20 NOTE — Progress Notes (Signed)
Subjective:  Alexis Blanchard is a 25 y.o. female here for BP check.   Hypertension ROS: Pt denies any headaches, blurred vision, or chest pains.    Objective:  BP: 145/95   156/108   LMP: 03-09-2020  Appearance alert, well appearing, and in no distress. General exam BP needs improvement    Assessment:   Blood Pressure needs further observation.   Plan:  Pt to report to MAU for further observation .

## 2020-11-20 NOTE — Progress Notes (Signed)
Patient was assessed and managed by nursing staff during this encounter. I have reviewed the chart and agree with the documentation and plan.   Daryn Hicks, MD 11/20/2020 11:02 AM 

## 2020-11-21 ENCOUNTER — Encounter (HOSPITAL_COMMUNITY): Payer: Self-pay | Admitting: Obstetrics & Gynecology

## 2020-11-21 ENCOUNTER — Other Ambulatory Visit: Payer: Self-pay | Admitting: Advanced Practice Midwife

## 2020-11-21 ENCOUNTER — Other Ambulatory Visit: Payer: Self-pay

## 2020-11-21 ENCOUNTER — Observation Stay (HOSPITAL_BASED_OUTPATIENT_CLINIC_OR_DEPARTMENT_OTHER)
Admission: AD | Admit: 2020-11-21 | Discharge: 2020-11-22 | Disposition: A | Discharge status: Home / Self Care | Payer: Medicaid Other | Source: Home / Self Care | Attending: Obstetrics & Gynecology | Admitting: Obstetrics & Gynecology

## 2020-11-21 DIAGNOSIS — O10911 Unspecified pre-existing hypertension complicating pregnancy, first trimester: Secondary | ICD-10-CM | POA: Diagnosis present

## 2020-11-21 DIAGNOSIS — Z8759 Personal history of other complications of pregnancy, childbirth and the puerperium: Secondary | ICD-10-CM

## 2020-11-21 DIAGNOSIS — Z7982 Long term (current) use of aspirin: Secondary | ICD-10-CM | POA: Insufficient documentation

## 2020-11-21 DIAGNOSIS — O133 Gestational [pregnancy-induced] hypertension without significant proteinuria, third trimester: Secondary | ICD-10-CM | POA: Diagnosis not present

## 2020-11-21 DIAGNOSIS — Z20822 Contact with and (suspected) exposure to covid-19: Secondary | ICD-10-CM | POA: Insufficient documentation

## 2020-11-21 DIAGNOSIS — Z3A36 36 weeks gestation of pregnancy: Secondary | ICD-10-CM | POA: Insufficient documentation

## 2020-11-21 DIAGNOSIS — O139 Gestational [pregnancy-induced] hypertension without significant proteinuria, unspecified trimester: Secondary | ICD-10-CM | POA: Diagnosis present

## 2020-11-21 DIAGNOSIS — O24419 Gestational diabetes mellitus in pregnancy, unspecified control: Secondary | ICD-10-CM | POA: Insufficient documentation

## 2020-11-21 DIAGNOSIS — O368130 Decreased fetal movements, third trimester, not applicable or unspecified: Secondary | ICD-10-CM | POA: Diagnosis not present

## 2020-11-21 DIAGNOSIS — Z3689 Encounter for other specified antenatal screening: Secondary | ICD-10-CM

## 2020-11-21 LAB — COMPREHENSIVE METABOLIC PANEL
ALT: 14 U/L (ref 0–44)
AST: 19 U/L (ref 15–41)
Albumin: 2.6 g/dL — ABNORMAL LOW (ref 3.5–5.0)
Alkaline Phosphatase: 119 U/L (ref 38–126)
Anion gap: 6 (ref 5–15)
BUN: <5 mg/dL — ABNORMAL LOW (ref 6–20)
CO2: 25 mmol/L (ref 22–32)
Calcium: 9.4 mg/dL (ref 8.9–10.3)
Chloride: 106 mmol/L (ref 98–111)
Creatinine, Ser: 0.51 mg/dL (ref 0.44–1.00)
GFR, Estimated: >60 mL/min (ref >60)
Glucose, Bld: 80 mg/dL (ref 70–99)
Potassium: 3.8 mmol/L (ref 3.5–5.1)
Sodium: 137 mmol/L (ref 135–145)
Total Bilirubin: 0.9 mg/dL (ref 0.3–1.2)
Total Protein: 5.7 g/dL — ABNORMAL LOW (ref 6.5–8.1)

## 2020-11-21 LAB — CBC
HCT: 39.1 % (ref 36.0–46.0)
Hemoglobin: 13 g/dL (ref 12.0–15.0)
MCH: 27.3 pg (ref 26.0–34.0)
MCHC: 33.2 g/dL (ref 30.0–36.0)
MCV: 82.1 fL (ref 80.0–100.0)
Platelets: 326 10*3/uL (ref 150–400)
RBC: 4.76 MIL/uL (ref 3.87–5.11)
RDW: 15.6 % — ABNORMAL HIGH (ref 11.5–15.5)
WBC: 7.9 10*3/uL (ref 4.0–10.5)
nRBC: 0 % (ref 0.0–0.2)

## 2020-11-21 LAB — TYPE AND SCREEN
ABO/RH(D): B POS
Antibody Screen: NEGATIVE

## 2020-11-21 MED ORDER — CALCIUM CARBONATE ANTACID 500 MG PO CHEW: 2.0000 | CHEWABLE_TABLET | ORAL | Status: DC | PRN

## 2020-11-21 MED ORDER — ACETAMINOPHEN 325 MG PO TABS
650.0000 mg | ORAL_TABLET | ORAL | Status: DC | PRN
Start: 2020-11-21 — End: 2020-11-22
  Administered 2020-11-22: 650 mg via ORAL
  Filled 2020-11-21: qty 2

## 2020-11-21 MED ORDER — DOCUSATE SODIUM 100 MG PO CAPS
100.0000 mg | ORAL_CAPSULE | Freq: Every day | ORAL | Status: DC
  Administered 2020-11-22: 100 mg via ORAL
  Filled 2020-11-21: qty 1

## 2020-11-21 MED ORDER — ZOLPIDEM TARTRATE 5 MG PO TABS: 5.0000 mg | ORAL_TABLET | Freq: Every evening | ORAL | Status: DC | PRN

## 2020-11-21 MED ORDER — PRENATAL MULTIVITAMIN CH
1.0000 | ORAL_TABLET | Freq: Every day | ORAL | Status: DC
  Administered 2020-11-22: 1 via ORAL
  Filled 2020-11-21: qty 1

## 2020-11-21 NOTE — MAU Note (Signed)
Baby is now very active. Category 1 tracing-be aware of movement. Pt states headache is now resolved. Denies any visual changes or epigastric pain. BP130-140/90 since arrival

## 2020-11-21 NOTE — MAU Provider Note (Signed)
History     CSN: [id number]  Arrival date and time: 11/21/20 2052   Event Date/Time   First Provider Initiated Contact with Patient 11/21/20 2148      Chief Complaint  Patient presents with   Hypertension   Decreased Fetal Movement   HPI  Ms.Alexis Blanchard is a 25 y.o. female [phone] @ 43w5dreturning to MAU with elevated BP and DFM. She reports having a HA earlier today and called the office. The office took a while to call her back and she laid down. Upon waking up from her nap the HA was 0/10. The office recommended she come in for a BP check and NST. Since arrival to MAU she has felt her baby move normally. She has no headache, no changes In her vision. She does report occasional contractions.   She is a planned TOLAC.  Reports normal fetal movement since fetal monitors applied.     OB History     Gravida  4   Para  1   Term  0   Preterm  1   AB  2   Living  1      SAB  2   IAB  0   Ectopic  0   Multiple  0   Live Births  1           Past Medical History:  Diagnosis Date   Excessive fetal growth affecting management of mother in second trimester, antepartum 10/06/2020   '[ ]'  f/u rpt growth Normal 2h GTT  4/12: 92%, 684g, ac 64%, FL 94%, nl AFI   False positive RPR test 05/20/2020   Negative T.pallidium confirmatory test   Gestational diabetes    first preg   Severe preeclampsia    Delivered at 34 weeks   Syphilis    UTI (urinary tract infection)     Past Surgical History:  Procedure Laterality Date   CESAREAN SECTION N/A 06/08/2017   Procedure: CESAREAN SECTION;  Surgeon: HLavonia Drafts MD;  [facility]  Service: Obstetrics;  Laterality: N/A;   TONSILLECTOMY      Family History  Problem Relation Age of Onset   Hypertension Mother    Heart disease Maternal Aunt        CHF   Cancer Maternal Aunt 45       colon   Cancer Maternal Aunt 45       throat   Congestive Heart Failure Maternal Uncle    Diabetes  Maternal Grandmother    Hypertension Maternal Grandmother    Heart disease Maternal Grandmother     Social History   Tobacco Use   Smoking status: Never   Smokeless tobacco: Never  Vaping Use   Vaping Use: Never used  Substance Use Topics   Alcohol use: Not Currently    Comment: pt states that she has not used since finding out about the pregnancy   Drug use: Not Currently    Types: Marijuana    Comment: pt states that she has not used since finding out about the pregnancy    Allergies: No Known Allergies  Medications Prior to Admission  Medication Sig Dispense Refill Last Dose   cyclobenzaprine (FLEXERIL) 10 MG tablet Take 1 tablet (10 mg total) by mouth every 8 (eight) hours as needed for muscle spasms. 30 tablet 1 11/21/2020   Prenat w/o A Vit-FeFum-FePo-FA (CONCEPT OB) 130-92.4-1 MG CAPS Take 1 tablet by mouth daily. 30 capsule 12 11/21/2020   aspirin EC 81  MG tablet Take 1 tablet (81 mg total) by mouth daily. Take after 12 weeks for prevention of preeclampsia later in pregnancy 300 tablet 2    Blood Pressure Monitoring (BLOOD PRESSURE KIT) DEVI 1 Device by Does not apply route once a week. To be monitored weekly from home 1 each 0    No results found for this or any previous visit (from the past 24 hour(s)).   Review of Systems  Constitutional:  Negative for fever.  Eyes:  Negative for photophobia and visual disturbance.  Gastrointestinal:  Positive for abdominal pain.  Genitourinary:  Negative for vaginal bleeding.  Neurological:  Negative for headaches.  Physical Exam   Blood pressure (!) 137/104, pulse 99, temperature 98 F (36.7 C), temperature source Oral, resp. rate 18, height '5\' 2"'  (1.575 m), weight 71.6 kg, last menstrual period 03/09/2020, SpO2 99 %.  Patient Vitals for the past 24 hrs:  BP Temp Temp src Pulse Resp SpO2 Height Weight  11/21/20 2200 (!) 147/113 -- -- (!) 115 -- 99 % -- --  11/21/20 2155 -- -- -- -- -- 98 % -- --  11/21/20 2150 -- -- -- -- --  99 % -- --  11/21/20 2145 (!) 134/98 -- -- (!) 105 -- 98 % -- --  11/21/20 2140 (!) 137/104 -- -- 99 -- 99 % -- --  11/21/20 2135 -- -- -- -- -- 99 % -- --  11/21/20 2130 (!) 142/93 -- -- (!) 109 -- 98 % -- --  11/21/20 2125 -- -- -- -- -- 98 % -- --  11/21/20 2120 (!) 137/96 -- -- (!) 109 -- -- -- --  11/21/20 2106 (!) 151/94 98 F (36.7 C) Oral (!) 103 18 99 % '5\' 2"'  (1.575 m) 71.6 kg     Physical Exam Constitutional:      General: She is not in acute distress.    Appearance: Normal appearance. She is not ill-appearing, toxic-appearing or diaphoretic.  HENT:     Head: Normocephalic.  Eyes:     Pupils: Pupils are equal, round, and reactive to light.  Pulmonary:     Effort: Pulmonary effort is normal.  Genitourinary:    Comments: Cervix: FT, thick, anterior. Exam by: Noni Saupe, NP  Skin:    General: Skin is warm.  Neurological:     Mental Status: She is alert and oriented to person, place, and time.     Deep Tendon Reflexes: Reflexes normal.     Comments: Negative clonus.   Psychiatric:        Behavior: Behavior normal.   Fetal Tracing: Baseline: 130 bpm Variability: Moderate  Accelerations: 15x15 Decelerations: none Toco: Occaional, irregular pattern.   MAU Course  Procedures None  MDM  Patient presents to MAU with known GHTN, had one episode of HA that resolved with rest. In MAU her BP's are elevated with one severe range pressure of 147/113. She does not meet criteria at this time for severe Pre E and therefore will be admitted to OBS for observation. In the event her HA returns or she has an additional severe range BP delivery will be likely. Dr. Nelda Marseille at bedside to discuss plan of care with patient Pre E labs collected.   Assessment and Plan   A:  1. Gestational hypertension, third trimester   2. History of severe pre-eclampsia   3. [redacted] weeks gestation of pregnancy   4. Decreased fetal movements in third trimester, single or unspecified fetus   5. NST  (non-stress  test) reactive      P:  Admit to OBS Continue to monitor BPQ 15 mins Continuous fetal monitoring.  GBS collected- not collected in office.    Lezlie Lye, NP 11/21/2020  10:25 PM

## 2020-11-21 NOTE — MAU Note (Signed)
Pt reports that she was having a headache and nausea at home and check her BP was 141/82.  Pt also states that she has not felt the baby move as much as she usually does today.

## 2020-11-21 NOTE — H&P (Signed)
History     CSN: [id number] Arrival date and time: 11/21/20 2052   Event Date/Time   First Provider Initiated Contact with Patient 11/21/20 2148      Chief Complaint  Patient presents with   Hypertension   Decreased Fetal Movement   HPI  Alexis Blanchard is a 25 y.o. female [phone] @ 81w5dreturning to MAU with elevated BP and DFM. She reports having a HA earlier today and called the office. The office took a while to call her back and she laid down. Upon waking up from her nap the HA was 0/10. The office recommended she come in for a BP check and NST. Since arrival to MAU she has felt her baby move normally. She has no headache, no changes In her vision. She does report occasional contractions.   She is a planned TOLAC.  Reports normal fetal movement since fetal monitors applied.     OB History     Gravida  4   Para  1   Term  0   Preterm  1   AB  2   Living  1      SAB  2   IAB  0   Ectopic  0   Multiple  0   Live Births  1           Past Medical History:  Diagnosis Date   Excessive fetal growth affecting management of mother in second trimester, antepartum 10/06/2020   '[ ]'  f/u rpt growth Normal 2h GTT  4/12: 92%, 684g, ac 64%, FL 94%, nl AFI   False positive RPR test 05/20/2020   Negative T.pallidium confirmatory test   Gestational diabetes    first preg   Severe preeclampsia    Delivered at 34 weeks   Syphilis    UTI (urinary tract infection)     Past Surgical History:  Procedure Laterality Date   CESAREAN SECTION N/A 06/08/2017   Procedure: CESAREAN SECTION;  Surgeon: HLavonia Drafts MD;  [place] [facility]  Service: Obstetrics;  Laterality: N/A;   TONSILLECTOMY      Family History  Problem Relation Age of Onset   Hypertension Mother    Heart disease Maternal Aunt        CHF   Cancer Maternal Aunt 45       colon   Cancer Maternal Aunt 45       throat   Congestive Heart Failure Maternal Uncle    Diabetes  Maternal Grandmother    Hypertension Maternal Grandmother    Heart disease Maternal Grandmother     Social History   Tobacco Use   Smoking status: Never   Smokeless tobacco: Never  Vaping Use   Vaping Use: Never used  Substance Use Topics   Alcohol use: Not Currently    Comment: pt states that she has not used since finding out about the pregnancy   Drug use: Not Currently    Types: Marijuana    Comment: pt states that she has not used since finding out about the pregnancy    Allergies: No Known Allergies  Medications Prior to Admission  Medication Sig Dispense Refill Last Dose   cyclobenzaprine (FLEXERIL) 10 MG tablet Take 1 tablet (10 mg total) by mouth every 8 (eight) hours as needed for muscle spasms. 30 tablet 1 11/21/2020   Prenat w/o A Vit-FeFum-FePo-FA (CONCEPT OB) 130-92.4-1 MG CAPS Take 1 tablet by mouth daily. 30 capsule 12 11/21/2020   aspirin EC 81  MG tablet Take 1 tablet (81 mg total) by mouth daily. Take after 12 weeks for prevention of preeclampsia later in pregnancy 300 tablet 2    Blood Pressure Monitoring (BLOOD PRESSURE KIT) DEVI 1 Device by Does not apply route once a week. To be monitored weekly from home 1 each 0    No results found for this or any previous visit (from the past 24 hour(s)).   Review of Systems  Constitutional:  Negative for fever.  Eyes:  Negative for photophobia and visual disturbance.  Gastrointestinal:  Positive for abdominal pain.  Genitourinary:  Negative for vaginal bleeding.  Neurological:  Negative for headaches.  Physical Exam   Blood pressure (!) 137/104, pulse 99, temperature 98 F (36.7 C), temperature source Oral, resp. rate 18, height '5\' 2"'  (1.575 m), weight 71.6 kg, last menstrual period 03/09/2020, SpO2 99 %.  Patient Vitals for the past 24 hrs:  BP Temp Temp src Pulse Resp SpO2 Height Weight  11/21/20 2200 (!) 147/113 -- -- (!) 115 -- 99 % -- --  11/21/20 2155 -- -- -- -- -- 98 % -- --  11/21/20 2150 -- -- -- -- --  99 % -- --  11/21/20 2145 (!) 134/98 -- -- (!) 105 -- 98 % -- --  11/21/20 2140 (!) 137/104 -- -- 99 -- 99 % -- --  11/21/20 2135 -- -- -- -- -- 99 % -- --  11/21/20 2130 (!) 142/93 -- -- (!) 109 -- 98 % -- --  11/21/20 2125 -- -- -- -- -- 98 % -- --  11/21/20 2120 (!) 137/96 -- -- (!) 109 -- -- -- --  11/21/20 2106 (!) 151/94 98 F (36.7 C) Oral (!) 103 18 99 % '5\' 2"'  (1.575 m) 71.6 kg     Physical Exam Constitutional:      General: She is not in acute distress.    Appearance: Normal appearance. She is not ill-appearing, toxic-appearing or diaphoretic.  HENT:     Head: Normocephalic.  Eyes:     Pupils: Pupils are equal, round, and reactive to light.  Pulmonary:     Effort: Pulmonary effort is normal.  Genitourinary:    Comments: Cervix: FT, thick, anterior. Exam by: Noni Saupe, NP  Skin:    General: Skin is warm.  Neurological:     Mental Status: She is alert and oriented to person, place, and time.     Deep Tendon Reflexes: Reflexes normal.     Comments: Negative clonus.   Psychiatric:        Behavior: Behavior normal.   Fetal Tracing: Baseline: 130 bpm Variability: Moderate  Accelerations: 15x15 Decelerations: none Toco: Occaional, irregular pattern.   Assessment and Plan   A:  1. Gestational hypertension, third trimester   2. History of severe pre-eclampsia   3. [redacted] weeks gestation of pregnancy   4. Decreased fetal movements in third trimester, single or unspecified fetus   5. NST (non-stress test) reactive      P:  Admit to OBS Continue to monitor BPQ 15 mins Continuous fetal monitoring.  GBS collected- not collected in office.   Lezlie Lye, NP. 10:32 PM 11/21/2020

## 2020-11-22 ENCOUNTER — Encounter (HOSPITAL_COMMUNITY): Payer: Self-pay | Admitting: Obstetrics & Gynecology

## 2020-11-22 DIAGNOSIS — O368130 Decreased fetal movements, third trimester, not applicable or unspecified: Secondary | ICD-10-CM | POA: Diagnosis not present

## 2020-11-22 DIAGNOSIS — O133 Gestational [pregnancy-induced] hypertension without significant proteinuria, third trimester: Secondary | ICD-10-CM | POA: Diagnosis not present

## 2020-11-22 DIAGNOSIS — Z3A36 36 weeks gestation of pregnancy: Secondary | ICD-10-CM | POA: Diagnosis not present

## 2020-11-22 LAB — RESP PANEL BY RT-PCR (FLU A&B, COVID) ARPGX2
Influenza A by PCR: NEGATIVE
Influenza B by PCR: NEGATIVE
SARS Coronavirus 2 by RT PCR: NEGATIVE

## 2020-11-22 NOTE — Discharge Instructions (Addendum)
Come back tonight at 1145 pm for your induction.

## 2020-11-22 NOTE — Progress Notes (Signed)
FACULTY PRACTICE ANTEPARTUM(COMPREHENSIVE) NOTE  Alexis Blanchard is a 25 y.o. [phone] with Estimated Date of Delivery: 12/14/20   By  best clinical estimate [redacted]w[redacted]d  who is admitted for gestational HTN- concern for preeclampsia.    Fetal presentation is cephalic. Length of Stay:  0  Days  Date of admission:11/21/2020  Subjective: Pt resting comfortably this am.  Noted mild HA around 3am that improved s/p tylenol.  Denies HA this am.  No blurry vision, no RUQ pain.   Patient reports the fetal movement as active. Patient reports uterine contraction  activity as irregular. Patient reports  vaginal bleeding as none. Patient describes fluid per vagina as None.  Vitals:  Blood pressure (!) 143/82, pulse (!) 103, temperature 97.6 F (36.4 C), temperature source Oral, resp. rate 18, height 5\' 2"  (1.575 m), weight 71.6 kg, last menstrual period 03/09/2020, SpO2 92 %. Vitals:   11/21/20 2230 11/21/20 2248 11/22/20 0323 11/22/20 0417  BP:  (!) 139/98 136/84 (!) 143/82  Pulse:  98 (!) 111 (!) 103  Resp:  16 18 18   Temp:  97.8 F (36.6 C) 97.9 F (36.6 C) 97.6 F (36.4 C)  TempSrc:  Oral Oral Oral  SpO2: 99% 99% 99% 92%  Weight:      Height:       Physical Examination:  General appearance - alert, well appearing, and in no distress Mental status - alert, oriented to person, place, and time Chest - clear to auscultation, no wheezes, rales or rhonchi, symmetric air entry Heart - normal rate and regular rhythm Abdomen - gravid, soft and non-tender Neurological - alert, oriented, normal speech, no focal findings or movement disorder noted Musculoskeletal - no calf tenderness bilaterally Extremities - no edema noted Skin - warm and dry   Fetal Monitoring:  Baseline: 135 bpm, Variability: moderate variability, Accelerations: +15x15 accels, and Decelerations: Absent   reactive  Labs:  Results for orders placed or performed during the hospital encounter of 11/21/20 (from the past 24 hour(s))   CBC   Collection Time: 11/21/20 10:15 PM  Result Value Ref Range   WBC 7.9 4.0 - 10.5 K/uL   RBC 4.76 3.87 - 5.11 MIL/uL   Hemoglobin 13.0 12.0 - 15.0 g/dL   HCT 11/23/20 11/23/20 - 22.0 %   MCV 82.1 80.0 - 100.0 fL   MCH 27.3 26.0 - 34.0 pg   MCHC 33.2 30.0 - 36.0 g/dL   RDW 25.4 (H) 27.0 - 62.3 %   Platelets 326 150 - 400 K/uL   nRBC 0.0 0.0 - 0.2 %  Comprehensive metabolic panel   Collection Time: 11/21/20 10:15 PM  Result Value Ref Range   Sodium 137 135 - 145 mmol/L   Potassium 3.8 3.5 - 5.1 mmol/L   Chloride 106 98 - 111 mmol/L   CO2 25 22 - 32 mmol/L   Glucose, Bld 80 70 - 99 mg/dL   BUN <5 (L) 6 - 20 mg/dL   Creatinine, Ser 83.1 0.44 - 1.00 mg/dL   Calcium 9.4 8.9 - 11/23/20 mg/dL   Total Protein 5.7 (L) 6.5 - 8.1 g/dL   Albumin 2.6 (L) 3.5 - 5.0 g/dL   AST 19 15 - 41 U/L   ALT 14 0 - 44 U/L   Alkaline Phosphatase 119 38 - 126 U/L   Total Bilirubin 0.9 0.3 - 1.2 mg/dL   GFR, Estimated 5.17 61.6 mL/min   Anion gap 6 5 - 15  Type and screen [facility]  Collection Time: 11/21/20 10:15 PM  Result Value Ref Range   ABO/RH(D) B POS    Antibody Screen NEG    Sample Expiration      11/24/2020,2359 Performed at [facility], [address] [place], [place] [address]   Resp Panel by RT-PCR (Flu A&B, Covid) Nasopharyngeal Swab   Collection Time: 11/21/20 10:26 PM   Specimen: Nasopharyngeal Swab; Nasopharyngeal(NP) swabs in vial transport medium  Result Value Ref Range   SARS Coronavirus 2 by RT PCR NEGATIVE NEGATIVE   Influenza A by PCR NEGATIVE NEGATIVE   Influenza B by PCR NEGATIVE NEGATIVE    I have reviewed the patient's current medications.  ASSESSMENT: [id number] [redacted]w[redacted]d Estimated Date of Delivery: 12/14/20  Admitted for Gestational HTN- plan to r/o preeclampsia  PLAN: 1) FWB- reassuring, Cat. I -continue NST q shift  2) Gestational HTN -BP remained stable overnight -no evidence of preeclampsia with severe features -24hr urine in process  3)  Maternal care -tylenol prn -regular diet -activity as tolerated -GBS positive, PCN in labor  DISPO: Continue plan as above, if BP remain stable will plan for discharge to return for IOL at 37wks  Sharon Seller 11/22/2020,7:05 AM

## 2020-11-22 NOTE — Discharge Summary (Signed)
Physician Discharge Summary  Patient ID: LAURELYN TERRERO MRN: [id number] DOB/AGE: [birth year] 23 y.o.  Admit date: 11/21/2020 Discharge date: 11/22/2020  Admission Diagnoses: Pregnancy at 36/5. Gestational HTN with concern for severe pre-eclampsia. Decreased fetal movement  Discharge Diagnoses: Pregnancy at 36/6. Gestational HTN   Discharged Condition: good  Hospital Course: Patient had negative pre-eclampsia labs and negative ROS for severe features. Fetal status reassuring with reactive non stress tests. Patient to come back later tonight for IOL for gHTN.   Consults: None  Significant Diagnostic Studies:  CBC Latest Ref Rng & Units 11/23/2020 11/21/2020 11/20/2020  WBC 4.0 - 10.5 K/uL 7.4 7.9 8.1  Hemoglobin 12.0 - 15.0 g/dL 12.7 13.0 13.2  Hematocrit 36.0 - 46.0 % 38.2 39.1 40.1  Platelets 150 - 400 K/uL 341 326 314   CMP Latest Ref Rng & Units 11/23/2020 11/21/2020 11/20/2020  Glucose 70 - 99 mg/dL 81 80 76  BUN 6 - 20 mg/dL 5(L) <5(L) 6  Creatinine 0.44 - 1.00 mg/dL 0.57 0.51 0.60  Sodium 135 - 145 mmol/L 137 137 136  Potassium 3.5 - 5.1 mmol/L 4.3 3.8 4.0  Chloride 98 - 111 mmol/L 109 106 105  CO2 22 - 32 mmol/L _0 Calcium 8.9 - 10.3 mg/dL 9.1 9.4 9.1  Total Protein 6.5 - 8.1 g/dL 5.7(L) 5.7(L) 5.9(L)  Total Bilirubin 0.3 - 1.2 mg/dL 0.5 0.9 0.5  Alkaline Phos 38 - 126 U/L 116 119 125  AST 15 - 41 U/L _1 ALT 0 - 44 U/L _2 Discharge Exam: Blood pressure 125/72, pulse 99, temperature 97.9 F (36.6 C), temperature source Oral, resp. rate 18, height _3  (1.575 m), weight 71.6 kg, last menstrual period 03/09/2020, SpO2 98 %. General appearance: alert Resp: clear to auscultation bilaterally Cardio: regular rate and rhythm, S1, S2 normal, no murmur, click, rub or gallop Abdomen: gravid, nttp  Disposition: Discharge disposition: 01-Home or Self Care       Discharge Instructions     Discharge patient   Complete by: As directed    Discharge  disposition: 01-Home or Self Care   Discharge patient date: 11/22/2020      Allergies as of 11/22/2020   Not on File      Medication List     STOP taking these medications    aspirin EC 81 MG tablet       TAKE these medications    Blood Pressure Kit Devi 1 Device by Does not apply route once a week. To be monitored weekly from home   Concept OB 130-92.4-1 MG Caps Take 1 tablet by mouth daily.   cyclobenzaprine 10 MG tablet Commonly known as: FLEXERIL Take 1 tablet (10 mg total) by mouth every 8 (eight) hours as needed for muscle spasms.         SignedAletha Halim 11/22/2020, 9:43 AM

## 2020-11-23 ENCOUNTER — Other Ambulatory Visit: Payer: Self-pay

## 2020-11-23 ENCOUNTER — Inpatient Hospital Stay (HOSPITAL_COMMUNITY)
Admission: AD | Admit: 2020-11-23 | Discharge: 2020-11-25 | DRG: 807 | Disposition: A | Discharge status: Home / Self Care | Payer: Medicaid Other | Attending: Obstetrics & Gynecology | Admitting: Obstetrics & Gynecology

## 2020-11-23 ENCOUNTER — Encounter (HOSPITAL_COMMUNITY): Payer: Self-pay | Admitting: Obstetrics & Gynecology

## 2020-11-23 ENCOUNTER — Inpatient Hospital Stay (HOSPITAL_COMMUNITY): Payer: Medicaid Other | Admitting: Anesthesiology

## 2020-11-23 ENCOUNTER — Inpatient Hospital Stay (HOSPITAL_COMMUNITY): Payer: Medicaid Other

## 2020-11-23 ENCOUNTER — Ambulatory Visit: Payer: Medicaid Other | Attending: Certified Nurse Midwife | Admitting: Physical Therapy

## 2020-11-23 DIAGNOSIS — O98511 Other viral diseases complicating pregnancy, first trimester: Secondary | ICD-10-CM | POA: Diagnosis present

## 2020-11-23 DIAGNOSIS — O34219 Maternal care for unspecified type scar from previous cesarean delivery: Secondary | ICD-10-CM | POA: Diagnosis not present

## 2020-11-23 DIAGNOSIS — Z8616 Personal history of COVID-19: Secondary | ICD-10-CM | POA: Diagnosis not present

## 2020-11-23 DIAGNOSIS — O99824 Streptococcus B carrier state complicating childbirth: Secondary | ICD-10-CM | POA: Diagnosis not present

## 2020-11-23 DIAGNOSIS — O134 Gestational [pregnancy-induced] hypertension without significant proteinuria, complicating childbirth: Secondary | ICD-10-CM | POA: Diagnosis not present

## 2020-11-23 DIAGNOSIS — D563 Thalassemia minor: Secondary | ICD-10-CM | POA: Diagnosis present

## 2020-11-23 DIAGNOSIS — Z30017 Encounter for initial prescription of implantable subdermal contraceptive: Secondary | ICD-10-CM

## 2020-11-23 DIAGNOSIS — Z3A37 37 weeks gestation of pregnancy: Secondary | ICD-10-CM

## 2020-11-23 DIAGNOSIS — U071 COVID-19: Secondary | ICD-10-CM | POA: Diagnosis present

## 2020-11-23 DIAGNOSIS — R87610 Atypical squamous cells of undetermined significance on cytologic smear of cervix (ASC-US): Secondary | ICD-10-CM | POA: Diagnosis present

## 2020-11-23 DIAGNOSIS — O34211 Maternal care for low transverse scar from previous cesarean delivery: Secondary | ICD-10-CM | POA: Diagnosis not present

## 2020-11-23 DIAGNOSIS — O133 Gestational [pregnancy-induced] hypertension without significant proteinuria, third trimester: Secondary | ICD-10-CM | POA: Diagnosis present

## 2020-11-23 DIAGNOSIS — R87810 Cervical high risk human papillomavirus (HPV) DNA test positive: Secondary | ICD-10-CM | POA: Diagnosis present

## 2020-11-23 DIAGNOSIS — O0990 Supervision of high risk pregnancy, unspecified, unspecified trimester: Secondary | ICD-10-CM

## 2020-11-23 LAB — COMPREHENSIVE METABOLIC PANEL
ALT: 12 U/L (ref 0–44)
AST: 17 U/L (ref 15–41)
Albumin: 2.6 g/dL — ABNORMAL LOW (ref 3.5–5.0)
Alkaline Phosphatase: 116 U/L (ref 38–126)
Anion gap: 6 (ref 5–15)
BUN: 5 mg/dL — ABNORMAL LOW (ref 6–20)
CO2: 22 mmol/L (ref 22–32)
Calcium: 9.1 mg/dL (ref 8.9–10.3)
Chloride: 109 mmol/L (ref 98–111)
Creatinine, Ser: 0.57 mg/dL (ref 0.44–1.00)
GFR, Estimated: >60 mL/min (ref >60)
Glucose, Bld: 81 mg/dL (ref 70–99)
Potassium: 4.3 mmol/L (ref 3.5–5.1)
Sodium: 137 mmol/L (ref 135–145)
Total Bilirubin: 0.5 mg/dL (ref 0.3–1.2)
Total Protein: 5.7 g/dL — ABNORMAL LOW (ref 6.5–8.1)

## 2020-11-23 LAB — CBC
HCT: 38.2 % (ref 36.0–46.0)
HCT: 38.3 % (ref 36.0–46.0)
Hemoglobin: 12.7 g/dL (ref 12.0–15.0)
Hemoglobin: 12.9 g/dL (ref 12.0–15.0)
MCH: 27.2 pg (ref 26.0–34.0)
MCH: 27.4 pg (ref 26.0–34.0)
MCHC: 33.2 g/dL (ref 30.0–36.0)
MCHC: 33.7 g/dL (ref 30.0–36.0)
MCV: 81.8 fL (ref 80.0–100.0)
MCV: 81.5 fL (ref 80.0–100.0)
Platelets: 341 10*3/uL (ref 150–400)
Platelets: 335 10*3/uL (ref 150–400)
RBC: 4.67 MIL/uL (ref 3.87–5.11)
RBC: 4.7 MIL/uL (ref 3.87–5.11)
RDW: 15.6 % — ABNORMAL HIGH (ref 11.5–15.5)
RDW: 15.3 % (ref 11.5–15.5)
WBC: 7.4 10*3/uL (ref 4.0–10.5)
WBC: 11.5 10*3/uL — ABNORMAL HIGH (ref 4.0–10.5)
nRBC: 0 % (ref 0.0–0.2)
nRBC: 0 % (ref 0.0–0.2)

## 2020-11-23 LAB — TYPE AND SCREEN
ABO/RH(D): B POS
Antibody Screen: NEGATIVE

## 2020-11-23 LAB — PROTEIN / CREATININE RATIO, URINE
Creatinine, Urine: 34.21 mg/dL
Protein Creatinine Ratio: 0.2 mg/mg{Cre} — ABNORMAL HIGH (ref 0.00–0.15)
Total Protein, Urine: 7 mg/dL

## 2020-11-23 LAB — RPR: RPR Ser Ql: NONREACTIVE

## 2020-11-23 MED ORDER — OXYTOCIN-SODIUM CHLORIDE 30-0.9 UT/500ML-% IV SOLN
1.0000 m[IU]/min | INTRAVENOUS | Status: DC
  Administered 2020-11-23: 2 m[IU]/min via INTRAVENOUS

## 2020-11-23 MED ORDER — PHENYLEPHRINE 40 MCG/ML (10ML) SYRINGE FOR IV PUSH (FOR BLOOD PRESSURE SUPPORT): 80.0000 ug | PREFILLED_SYRINGE | INTRAVENOUS | Status: DC | PRN

## 2020-11-23 MED ORDER — OXYTOCIN BOLUS FROM INFUSION
333.0000 mL | Freq: Once | INTRAVENOUS | Status: AC
  Administered 2020-11-24: 333 mL via INTRAVENOUS

## 2020-11-23 MED ORDER — PHENYLEPHRINE 40 MCG/ML (10ML) SYRINGE FOR IV PUSH (FOR BLOOD PRESSURE SUPPORT)
80.0000 ug | PREFILLED_SYRINGE | INTRAVENOUS | Status: DC | PRN
  Filled 2020-11-23: qty 10

## 2020-11-23 MED ORDER — LIDOCAINE HCL (PF) 1 % IJ SOLN
INTRAMUSCULAR | Status: DC | PRN
  Administered 2020-11-23 (×2): 4 mL via EPIDURAL
  Administered 2020-11-23: 5 mL via EPIDURAL

## 2020-11-23 MED ORDER — FENTANYL-BUPIVACAINE-NACL 0.5-0.125-0.9 MG/250ML-% EP SOLN
12.0000 mL/h | EPIDURAL | Status: DC | PRN
  Administered 2020-11-23 – 2020-11-24 (×2): 12 mL/h via EPIDURAL
  Filled 2020-11-23 (×2): qty 250

## 2020-11-23 MED ORDER — ONDANSETRON HCL 4 MG/2ML IJ SOLN
4.0000 mg | Freq: Four times a day (QID) | INTRAMUSCULAR | Status: DC | PRN
  Administered 2020-11-23 – 2020-11-24 (×2): 4 mg via INTRAVENOUS
  Filled 2020-11-23 (×2): qty 2

## 2020-11-23 MED ORDER — LIDOCAINE HCL (PF) 1 % IJ SOLN: 30.0000 mL | INTRAMUSCULAR | Status: DC | PRN

## 2020-11-23 MED ORDER — FENTANYL CITRATE (PF) 100 MCG/2ML IJ SOLN
100.0000 ug | INTRAMUSCULAR | Status: DC | PRN
  Administered 2020-11-23 (×2): 100 ug via INTRAVENOUS
  Filled 2020-11-23 (×3): qty 2

## 2020-11-23 MED ORDER — EPHEDRINE 5 MG/ML INJ: 10.0000 mg | INTRAVENOUS | Status: DC | PRN

## 2020-11-23 MED ORDER — LACTATED RINGERS IV SOLN: INTRAVENOUS | Status: DC

## 2020-11-23 MED ORDER — ACETAMINOPHEN 325 MG PO TABS: 650.0000 mg | ORAL_TABLET | ORAL | Status: DC | PRN

## 2020-11-23 MED ORDER — TERBUTALINE SULFATE 1 MG/ML IJ SOLN: 0.2500 mg | Freq: Once | INTRAMUSCULAR | Status: DC | PRN

## 2020-11-23 MED ORDER — DIPHENHYDRAMINE HCL 50 MG/ML IJ SOLN: 12.5000 mg | INTRAMUSCULAR | Status: DC | PRN

## 2020-11-23 MED ORDER — OXYCODONE-ACETAMINOPHEN 5-325 MG PO TABS: 1.0000 | ORAL_TABLET | ORAL | Status: DC | PRN

## 2020-11-23 MED ORDER — PENICILLIN G POT IN DEXTROSE 60000 UNIT/ML IV SOLN
3.0000 10*6.[IU] | INTRAVENOUS | Status: DC
  Administered 2020-11-23 – 2020-11-24 (×6): 3 10*6.[IU] via INTRAVENOUS
  Filled 2020-11-23 (×6): qty 50

## 2020-11-23 MED ORDER — SODIUM CHLORIDE 0.9 % IV SOLN
5.0000 10*6.[IU] | Freq: Once | INTRAVENOUS | Status: AC
  Administered 2020-11-23: 5 10*6.[IU] via INTRAVENOUS
  Filled 2020-11-23: qty 5

## 2020-11-23 MED ORDER — SOD CITRATE-CITRIC ACID 500-334 MG/5ML PO SOLN: 30.0000 mL | ORAL | Status: DC | PRN

## 2020-11-23 MED ORDER — LIDOCAINE-EPINEPHRINE (PF) 2 %-1:200000 IJ SOLN
INTRAMUSCULAR | Status: DC | PRN
  Administered 2020-11-23: 3 mL via EPIDURAL

## 2020-11-23 MED ORDER — OXYCODONE-ACETAMINOPHEN 5-325 MG PO TABS: 2.0000 | ORAL_TABLET | ORAL | Status: DC | PRN

## 2020-11-23 MED ORDER — LACTATED RINGERS IV SOLN
500.0000 mL | INTRAVENOUS | Status: DC | PRN
  Administered 2020-11-23: 500 mL via INTRAVENOUS

## 2020-11-23 MED ORDER — BUPIVACAINE HCL (PF) 0.25 % IJ SOLN
INTRAMUSCULAR | Status: DC | PRN
  Administered 2020-11-23 (×2): 8 mL via EPIDURAL

## 2020-11-23 MED ORDER — LACTATED RINGERS IV SOLN
500.0000 mL | Freq: Once | INTRAVENOUS | Status: AC
  Administered 2020-11-23: 500 mL via INTRAVENOUS

## 2020-11-23 MED ORDER — FENTANYL CITRATE (PF) 100 MCG/2ML IJ SOLN
INTRAMUSCULAR | Status: DC | PRN
  Administered 2020-11-23: 100 ug via INTRAVENOUS

## 2020-11-23 MED ORDER — OXYTOCIN-SODIUM CHLORIDE 30-0.9 UT/500ML-% IV SOLN
2.5000 [IU]/h | INTRAVENOUS | Status: DC
  Filled 2020-11-23 (×2): qty 500

## 2020-11-23 NOTE — Anesthesia Preprocedure Evaluation (Addendum)
Anesthesia Evaluation  Patient identified by MRN, date of birth, ID band Patient awake    Reviewed: Allergy & Precautions, Patient's Chart, lab work & pertinent test results  History of Anesthesia Complications Negative for: history of anesthetic complications  Airway Mallampati: II  TM Distance: >3 FB Neck ROM: Full    Dental no notable dental hx.    Pulmonary neg pulmonary ROS,    Pulmonary exam normal        Cardiovascular hypertension, Normal cardiovascular exam     Neuro/Psych negative neurological ROS  negative psych ROS   GI/Hepatic negative GI ROS, Neg liver ROS,   Endo/Other  diabetes  Renal/GU negative Renal ROS  negative genitourinary   Musculoskeletal negative musculoskeletal ROS (+)   Abdominal   Peds  Hematology negative hematology ROS (+)   Anesthesia Other Findings Day of surgery medications reviewed with patient.  Reproductive/Obstetrics (+) Pregnancy (TOLAC, gHTN)                            Anesthesia Physical Anesthesia Plan  ASA: 3  Anesthesia Plan: Epidural   Post-op Pain Management:    Induction:   PONV Risk Score and Plan: Treatment may vary due to age or medical condition  Airway Management Planned: Natural Airway  Additional Equipment:   Intra-op Plan:   Post-operative Plan:   Informed Consent: I have reviewed the patients History and Physical, chart, labs and discussed the procedure including the risks, benefits and alternatives for the proposed anesthesia with the patient or authorized representative who has indicated his/her understanding and acceptance.       Plan Discussed with:   Anesthesia Plan Comments:        Anesthesia Quick Evaluation

## 2020-11-23 NOTE — Progress Notes (Signed)
Labor Progress Note Alexis Blanchard is a 25 y.o. [phone] at [redacted]w[redacted]d presented for IOL-gHTN. S: Doing well without complaints.  O:  BP (!) 164/113   Pulse (!) 127   Temp 99.7 F (37.6 C) (Axillary)   Resp 18   Ht 5\' 2"  (1.575 m)   Wt 72.2 kg   LMP 03/09/2020   SpO2 99%   BMI 29.12 kg/m  EFM: baseline 150bpm/mod variability/+accels/intermittent variable decels Toco: q2-5 min  [name]: Dilation: 6 Effacement (%): 70 Station: -1 Presentation: Vertex Exam by:: [email or web address], RN   A&P: 25 y.o. 30 [redacted]w[redacted]d presented for IOL-gHTN. #IOL/TOLAC: S/p FB. Pitocin started @0200  on 7/18, currently at 74mL/hr. AROM @1115 . IUPC placed @1538 . Continue to titrate pitocin. #Pain: epidural #FWB: cat 2\ previously, now overall reassuring, currently cat 1 #GBS positive, PCN, adequate prophylaxis #gHTN: preE labs nml, asymptomatic, no severe range BP. Continue to monitor.  8/18, MD 8:37 PM

## 2020-11-23 NOTE — Progress Notes (Signed)
Labor Progress Note Alexis Blanchard is a 25 y.o. [phone] at [redacted]w[redacted]d presented for TOLAC for IOL-gHTN. S: Patient is resting in bed, slightly uncomfortable. Received fentanyl x1 that gave some relief.   O:  BP (!) 141/105   Pulse 98   Temp 98.3 F (36.8 C) (Oral)   Resp 18   Ht 5\' 2"  (1.575 m)   Wt 72.2 kg   LMP 03/09/2020   BMI 29.12 kg/m  EFM: baseline 135 BPM/good variability/+accels/-decels  Toco: q2-49min  [name]: Dilation: 4 Effacement (%): 70 Station: -2 Presentation: Vertex Exam by:: [email or web address], RN   A&P: 25 y.o. 30 [redacted]w[redacted]d presenting for TOLAC for IOL-gHTN. #IOL: FB dislodged @0555 . Pitocin @8ml /hr. Continue to monitor as tolerated.  #Pain: prn interval pain med until epidural #FWB: cat 1 #GBS positive> PCN #gHTN: Pt remains asymptomatic, labs wnl (PCR pending). BP-137/91. Continue to monitor.   [redacted]w[redacted]d, MD, PGY1 [facility] for , [facility] 6:32 AM

## 2020-11-23 NOTE — H&P (Addendum)
OBSTETRIC ADMISSION HISTORY AND PHYSICAL  Alexis Blanchard is a 25 y.o. female [phone] with IUP at 67w0dby 9wk5d UKoreapresenting for TOLAC for IOL-gHTN. She reports +FMs, No LOF, no VB, no blurry vision, headaches or peripheral edema, and RUQ pain.  She plans on breast/bottle feeding. She requests Nexplanon IP for birth control. Pt is doing well and denies any problems at this time. Would like to eat prior to starting induction.   [facility]   Dating: By 05/16/2020 U/S --->  Estimated Date of Delivery: 12/14/20  Sono:   11/16/20'@[redacted]w[redacted]d' , CWD, normal anatomy, cephalic presentation,  anterior placental lie, 2911g, 61% EFW  Prenatal History/Complications:  Hx [id number] previous pregnancy Hx of PreE and SF  Past Medical History: Past Medical History:  Diagnosis Date   Excessive fetal growth affecting management of mother in second trimester, antepartum 10/06/2020   '[ ]'  f/u rpt growth Normal 2h GTT  4/12: 92%, 684g, ac 64%, FL 94%, nl AFI   False positive RPR test 05/20/2020   Negative T.pallidium confirmatory test   Gestational diabetes    first preg   Severe preeclampsia    Delivered at 34 weeks   Syphilis    UTI (urinary tract infection)     Past Surgical History: Past Surgical History:  Procedure Laterality Date   CESAREAN SECTION N/A 06/08/2017   Procedure: CESAREAN SECTION;  Surgeon: HLavonia Drafts MD;  [place]  Service: Obstetrics;  Laterality: N/A;   TONSILLECTOMY      Obstetrical History: OB History     Gravida  4   Para  1   Term  0   Preterm  1   AB  2   Living  1      SAB  2   IAB  0   Ectopic  0   Multiple  0   Live Births  1          Nursing Staff Provider  Office Location  [place] Dating  Early ultrasound  Language  English Anatomy [place] Limited spine  , rescan ordered  Flu Vaccine  Declined Genetic Screen  NIPS:  Low risk female AFP: neg     TDaP Vaccine   09/22/20 Hgb A1C or  GTT Early   Third trimester Nml 2 hr GTT  COVID Vaccine    LAB RESULTS      Blood Type B/Positive/-- (01/11 1516)   Feeding Plan Both Antibody Negative (01/11 1516)  Contraception Nexplanon IP Rubella 4.71 (01/11 1516)  Circumcision Yes, IP RPR Reactive (01/11 1516), neg T.pal  Pediatrician  TAPM HBsAg Negative (01/11 1516)   Support Person Unsure HCVAb Negative  Prenatal Classes No HIV Non Reactive (01/11 1516)     BTL Consent  GBS  POSITIVE IN URINE  VBAC Consent 09/22/20 Pap ASCUS +HPV    Hgb Electro  Alpha thal carrier  BP Cuff  CF Negative    SMA Negative   Social History Social History   Socioeconomic History   Marital status: Single    Spouse name: Not on file   Number of children: Not on file   Years of education: Not on file   Highest education level: Not on file  Occupational History   Not on file  Tobacco Use   Smoking status: Never   Smokeless tobacco: Never  Vaping Use   Vaping Use: Never used  Substance and Sexual Activity   Alcohol use: Not Currently  Comment: pt states that she has not used since finding out about the pregnancy   Drug use: Not Currently    Types: Marijuana    Comment: pt states that she has not used since finding out about the pregnancy   Sexual activity: Yes    Birth control/protection: None  Other Topics Concern   Not on file  Social History Narrative   Not on file   Social Determinants of Health   Financial Resource Strain: Not on file  Food Insecurity: Not on file  Transportation Needs: Not on file  Physical Activity: Not on file  Stress: Not on file  Social Connections: Not on file    Family History: Family History  Problem Relation Age of Onset   Hypertension Mother    Heart disease Maternal Aunt        CHF   Cancer Maternal Aunt 45       colon   Cancer Maternal Aunt 45       throat   Congestive Heart Failure Maternal Uncle    Diabetes Maternal Grandmother    Hypertension Maternal Grandmother    Heart disease Maternal  Grandmother     Allergies: No Known Allergies  Pt denies allergies to latex, iodine, or shellfish.  Medications Prior to Admission  Medication Sig Dispense Refill Last Dose   Blood Pressure Monitoring (BLOOD PRESSURE KIT) DEVI 1 Device by Does not apply route once a week. To be monitored weekly from home 1 each 0 11/22/2020   cyclobenzaprine (FLEXERIL) 10 MG tablet Take 1 tablet (10 mg total) by mouth every 8 (eight) hours as needed for muscle spasms. 30 tablet 1 11/22/2020   Prenat w/o A Vit-FeFum-FePo-FA (CONCEPT OB) 130-92.4-1 MG CAPS Take 1 tablet by mouth daily. 30 capsule 12 11/22/2020     Review of Systems   All systems reviewed and negative except as stated in HPI  Blood pressure 140/89, pulse (!) 102, temperature 98.4 F (36.9 C), temperature source Oral, resp. rate 18, height '5\' 2"'  (1.575 m), weight 72.2 kg, last menstrual period 03/09/2020. General appearance: alert, cooperative, and appears stated age Lungs: Normal respiratory effort Abdomen: soft, non-tender; gravid Pelvic: As stated below Extremities: Homans sign is negative, no sign of DVT Presentation: cephalic via US Fetal monitoringBaseline: 130 bpm, Variability: Good {> 6 bpm), Accelerations: Reactive, and Decelerations: Absent Uterine activity: intermittent  Dilation: 1 Effacement (%): Thick Station: -2 Exam by:: S W  Prenatal labs: ABO, Rh: --/--/B POS (07/18 0025) Antibody: NEG (07/18 0025) Rubella: 4.71 (01/11 1516) RPR: Non Reactive (05/17 0955)  HBsAg: Negative (01/11 1516)  HIV: Non Reactive (05/17 0955)  GBS:   positive urine 2 hr Glucola: passed Genetic screening:  nml Anatomy US: nml  Prenatal Transfer Tool  Maternal Diabetes: No-Hx of A1GDM Genetic Screening: Normal Maternal Ultrasounds/Referrals: Normal Fetal Ultrasounds or other Referrals:  None Maternal Substance Abuse:  No Significant Maternal Medications:  None Significant Maternal Lab Results: Group B Strep positive  Results for  orders placed or performed during the hospital encounter of 11/23/20 (from the past 24 hour(s))  CBC   Collection Time: 11/23/20 12:25 AM  Result Value Ref Range   WBC 7.4 4.0 - 10.5 K/uL   RBC 4.67 3.87 - 5.11 MIL/uL   Hemoglobin 12.7 12.0 - 15.0 g/dL   HCT 38.2 36.0 - 46.0 %   MCV 81.8 80.0 - 100.0 fL   MCH 27.2 26.0 - 34.0 pg   MCHC 33.2 30.0 - 36.0 g/dL   RDW  15.6 (H) 11.5 - 15.5 %   Platelets 341 150 - 400 K/uL   nRBC 0.0 0.0 - 0.2 %  Comprehensive metabolic panel   Collection Time: 11/23/20 12:25 AM  Result Value Ref Range   Sodium 137 135 - 145 mmol/L   Potassium 4.3 3.5 - 5.1 mmol/L   Chloride 109 98 - 111 mmol/L   CO2 22 22 - 32 mmol/L   Glucose, Bld 81 70 - 99 mg/dL   BUN 5 (L) 6 - 20 mg/dL   Creatinine, Ser 0.57 0.44 - 1.00 mg/dL   Calcium 9.1 8.9 - 10.3 mg/dL   Total Protein 5.7 (L) 6.5 - 8.1 g/dL   Albumin 2.6 (L) 3.5 - 5.0 g/dL   AST 17 15 - 41 U/L   ALT 12 0 - 44 U/L   Alkaline Phosphatase 116 38 - 126 U/L   Total Bilirubin 0.5 0.3 - 1.2 mg/dL   GFR, Estimated >60 >60 mL/min   Anion gap 6 5 - 15  Type and screen [place]   Collection Time: 11/23/20 12:25 AM  Result Value Ref Range   ABO/RH(D) B POS    Antibody Screen NEG    Sample Expiration      11/26/2020,2359 Performed at [facility], [place] [address] [place], [place] [address]     Patient Active Problem List   Diagnosis Date Noted   Gestational hypertension, third trimester 11/23/2020   Gestational hypertension 11/18/2020   COVID-19 affecting pregnancy in first trimester 06/16/2020   ASCUS with positive high risk HPV cervical on 05/20/2020 05/28/2020   History of severe pre-eclampsia 05/19/2020   History of cesarean delivery affecting pregnancy 05/19/2020   Supervision of high risk pregnancy, antepartum 05/19/2020   Group B streptococcal bacteriuria 05/06/2017   Alpha thalassemia silent carrier 01/31/2017    Assessment/Plan:  Alexis Blanchard is a 25 y.o. [id number] at  20w0dhere for TOLAC for IOL-gHTN  #IOL: Will start with FB and low dose pitocin for induction. FB placed '@0155'  by SMaryelizabeth Kaufmann CNM.  #Pain: PRN #FWB: Cat 1 #ID: GBS+ urine>PCN #MOF: Both #MOC: Nexplanon IP #Circ:  Yes #gHTN: PreE labs ordered, cont monitor BP-most recent 140/90, pt is asymptomatic.   AErskine Emery MD, PGY1 Center for [facility] 11/23/2020, 2:01 AM  Attestation of Supervision of Student:  I confirm that I have verified the information documented in the resident's note and that I have also personally reperformed the history, physical exam and all medical decision making activities.  I have verified that all services and findings are accurately documented in this student's note; and I agree with management and plan as outlined in the documentation. I have also made any necessary editorial changes.  --Cat I tracing --GHTN, no severe range or severe symptoms --Foley balloon placed at 0155, inflated to 60 mL, Pitocin to remain at or below 8 milliunits until FB dislodged   SDarlina Rumpf CPump[facility] Centerfor [facility]7/18/2022 2:08 AM

## 2020-11-23 NOTE — Anesthesia Procedure Notes (Addendum)
Epidural Patient location during procedure: OB Start time: 11/23/2020 11:50 AM End time: 11/23/2020 11:57 AM  Staffing Anesthesiologist: Kaylyn Layer, MD Performed: anesthesiologist   Preanesthetic Checklist Completed: patient identified, IV checked, risks and benefits discussed, monitors and equipment checked, pre-op evaluation and timeout performed  Epidural Patient position: sitting Prep: DuraPrep and site prepped and draped Patient monitoring: continuous pulse ox, blood pressure and heart rate Approach: midline Location: L3-L4 Injection technique: LOR air  Needle:  Needle type: Tuohy  Needle gauge: 17 G Needle length: 9 cm Needle insertion depth: 6 cm Catheter type: closed end flexible Catheter size: 19 Gauge Catheter at skin depth: 11 cm Test dose: 2% lidocaine with Epi 1:200 K  Assessment Events: blood aspirated, injection not painful, no injection resistance, no paresthesia and positive IV test  Additional Notes Patient identified. Risks, benefits, and alternatives discussed with patient including but not limited to bleeding, infection, nerve damage, paralysis, failed block, incomplete pain control, headache, blood pressure changes, nausea, vomiting, reactions to medication, itching, and postpartum back pain. Confirmed with bedside nurse the patient's most recent platelet count. Confirmed with patient that they are not currently taking any anticoagulation, have any bleeding history, or any family history of bleeding disorders. Patient expressed understanding and wished to proceed. All questions were answered. Sterile technique was used throughout the entire procedure. Please see nursing notes for vital signs.   2 attempts required, difficult due to inadequate patient positioning. Crisp LOR on second attempt but heme aspirated from catheter, positive test dose with lidocaine 2% with epi. Re-approached and replaced with negative aspiration of catheter, negative test dose  with lidocaine 1%.   Warning signs of high block given to the patient including shortness of breath, tingling/numbness in hands, complete motor block, or any concerning symptoms with instructions to call for help. Patient was given instructions on fall risk and not to get out of bed. All questions and concerns addressed with instructions to call with any issues or inadequate analgesia.  Reason for block:procedure for pain

## 2020-11-23 NOTE — Progress Notes (Signed)
Called to bedside by RN to discuss progress of labor with patient. FHT overall cat 1 since last check. Maternal VSS. Pitocin currently @14mL /hr, IUPC in place, contractions not adequate. Patient unchanged x6 hours. Discussed rLTCS for arrest of dilation vs continued titration of pitocin. Patient is starting to feel pressure and shake, so discussed that she could be transitioning to active labor as well. Discussed risks/benefits of vaginal delivery vs cesarean section. Anesthesia would like to place a new epidural due to patient's pain level and prospect of cesarean section. All questions answered, patient would like to think about it and discuss with her family members at bedside.   , MD OB Fellow, [facility], Center for [facility] 11/23/2020 10:40 PM

## 2020-11-23 NOTE — Plan of Care (Signed)

## 2020-11-23 NOTE — Progress Notes (Signed)
Alexis Blanchard is a 25 y.o. [phone] at [redacted]w[redacted]d by ultrasound admitted for induction of labor due to [facility].  Subjective: Patient reporting increased pain and pelvic pressure with contractions. Feeling the urge to have a BM with some contractions. Attempting to rest in between contractions. Considering an epidural now that pain is increasing with contractions. FOB and sister supportive at bedside.  Objective: BP 131/85   Pulse (!) 101   Temp 97.7 F (36.5 C) (Oral)   Resp 18   Ht 5\' 2"  (1.575 m)   Wt 72.2 kg   LMP 03/09/2020   BMI 29.12 kg/m  No intake/output data recorded. Total I/O In: -  Out: 400 [Urine:400]  FHT:  FHR: 130 bpm, variability: moderate,  accelerations:  Present,  decelerations:  Absent UC:   regular, every 1.5-3 minutes SVE:   Dilation: 5 Effacement (%): 70 Station: -1 Exam by:: [email or web address], CMN AROM after verbal consent; large amount of clear fluid in return. Patient tolerated procedure well.  Labs: Lab Results  Component Value Date   WBC 11.5 (H) 11/23/2020   HGB 12.9 11/23/2020   HCT 38.3 11/23/2020   MCV 81.5 11/23/2020   PLT 335 11/23/2020    Assessment / Plan: Induction of labor due to gestational hypertension,  progressing well on pitocin  Labor: Progressing on Pitocin, will continue to increase after AROM Preeclampsia:  labs stable Fetal Wellbeing:  Category I Pain Control:  Epidural - requested after AROM I/D:   On 3rd dose of PCN at this time Anticipated MOD:  NSVD  11/25/2020, CNM 11/23/2020, 11:31 AM

## 2020-11-23 NOTE — Progress Notes (Signed)
Alexis Blanchard is a 25 y.o. [phone] at [redacted]w[redacted]d by ultrasound admitted for induction of labor due to [facility].  Subjective: Patient more comfortable after redosing of epidural. Patient has vomiting, but eating ice. Sister supportive at bedside and mother on Facetime with patient and sister.  Objective: BP 123/78   Pulse (!) 106   Temp 97.8 F (36.6 C) (Axillary)   Resp 18   Ht 5\' 2"  (1.575 m)   Wt 72.2 kg   LMP 03/09/2020   SpO2 99%   BMI 29.12 kg/m  No intake/output data recorded. Total I/O In: -  Out: 1350 [Urine:1350]  FHT:  FHR: 125 bpm, variability: moderate,  accelerations:  Present,  decelerations:  Absent UC:   regular, every 1-3.5 minutes SVE:   Dilation: 5 Effacement (%): 70 Station: -2 Exam by:: [email or web address], CNM IUPC placed without difficulty - no VB noted  Labs: Lab Results  Component Value Date   WBC 11.5 (H) 11/23/2020   HGB 12.9 11/23/2020   HCT 38.3 11/23/2020   MCV 81.5 11/23/2020   PLT 335 11/23/2020    Assessment / Plan: Induction of labor due to gestational hypertension,  progressing well on pitocin  Labor: Slow progression on Pitocin Preeclampsia:  labs stable Fetal Wellbeing:  Category I Pain Control:  Epidural I/D:   (+) GBS Anticipated MOD:  NSVD  11/25/2020, CNM 11/23/2020, 4:19 PM

## 2020-11-23 NOTE — Anesthesia Procedure Notes (Signed)
Epidural Patient location during procedure: OB Start time: 11/23/2020 10:40 PM End time: 11/23/2020 10:52 PM  Staffing Anesthesiologist: Lannie Fields, DO Performed: anesthesiologist   Preanesthetic Checklist Completed: patient identified, IV checked, risks and benefits discussed, monitors and equipment checked, pre-op evaluation and timeout performed  Epidural Patient position: sitting Prep: DuraPrep and site prepped and draped Patient monitoring: continuous pulse ox, blood pressure, heart rate and cardiac monitor Approach: midline Location: L4-L5 Injection technique: LOR air  Needle:  Needle type: Tuohy  Needle gauge: 17 G Needle length: 9 cm Needle insertion depth: 6 cm Catheter type: closed end flexible Catheter size: 19 Gauge Catheter at skin depth: 11 cm Test dose: negative  Assessment Sensory level: T8 Events: blood not aspirated, injection not painful, no injection resistance, no paresthesia and negative IV test  Additional Notes Epidural replaced below level of prior one, intentional DPE with 24G sprotte. UneventfulReason for block:procedure for pain

## 2020-11-24 ENCOUNTER — Inpatient Hospital Stay (HOSPITAL_COMMUNITY): Payer: Medicaid Other

## 2020-11-24 ENCOUNTER — Encounter (HOSPITAL_COMMUNITY): Payer: Self-pay | Admitting: Obstetrics & Gynecology

## 2020-11-24 ENCOUNTER — Encounter: Payer: Medicaid Other | Admitting: Obstetrics and Gynecology

## 2020-11-24 DIAGNOSIS — O134 Gestational [pregnancy-induced] hypertension without significant proteinuria, complicating childbirth: Secondary | ICD-10-CM

## 2020-11-24 DIAGNOSIS — O34219 Maternal care for unspecified type scar from previous cesarean delivery: Secondary | ICD-10-CM | POA: Diagnosis not present

## 2020-11-24 DIAGNOSIS — O99824 Streptococcus B carrier state complicating childbirth: Secondary | ICD-10-CM

## 2020-11-24 DIAGNOSIS — Z3A37 37 weeks gestation of pregnancy: Secondary | ICD-10-CM

## 2020-11-24 LAB — COMPREHENSIVE METABOLIC PANEL
ALT: 12 U/L (ref 0–44)
AST: 27 U/L (ref 15–41)
Albumin: 2.3 g/dL — ABNORMAL LOW (ref 3.5–5.0)
Alkaline Phosphatase: 130 U/L — ABNORMAL HIGH (ref 38–126)
Anion gap: 7 (ref 5–15)
BUN: <5 mg/dL — ABNORMAL LOW (ref 6–20)
CO2: 22 mmol/L (ref 22–32)
Calcium: 8.5 mg/dL — ABNORMAL LOW (ref 8.9–10.3)
Chloride: 106 mmol/L (ref 98–111)
Creatinine, Ser: 0.66 mg/dL (ref 0.44–1.00)
GFR, Estimated: >60 mL/min (ref >60)
Glucose, Bld: 113 mg/dL — ABNORMAL HIGH (ref 70–99)
Potassium: 3.4 mmol/L — ABNORMAL LOW (ref 3.5–5.1)
Sodium: 135 mmol/L (ref 135–145)
Total Bilirubin: 0.8 mg/dL (ref 0.3–1.2)
Total Protein: 5.2 g/dL — ABNORMAL LOW (ref 6.5–8.1)

## 2020-11-24 LAB — CBC
HCT: 37.4 % (ref 36.0–46.0)
Hemoglobin: 12.7 g/dL (ref 12.0–15.0)
MCH: 27.8 pg (ref 26.0–34.0)
MCHC: 34 g/dL (ref 30.0–36.0)
MCV: 81.8 fL (ref 80.0–100.0)
Platelets: 303 10*3/uL (ref 150–400)
RBC: 4.57 MIL/uL (ref 3.87–5.11)
RDW: 15.4 % (ref 11.5–15.5)
WBC: 15.6 10*3/uL — ABNORMAL HIGH (ref 4.0–10.5)
nRBC: 0 % (ref 0.0–0.2)

## 2020-11-24 LAB — CULTURE, BETA STREP (GROUP B ONLY)

## 2020-11-24 MED ORDER — PRENATAL MULTIVITAMIN CH
1.0000 | ORAL_TABLET | Freq: Every day | ORAL | Status: DC
  Administered 2020-11-24 – 2020-11-25 (×2): 1 via ORAL
  Filled 2020-11-24 (×2): qty 1

## 2020-11-24 MED ORDER — NIFEDIPINE ER OSMOTIC RELEASE 30 MG PO TB24
30.0000 mg | ORAL_TABLET | Freq: Every day | ORAL | Status: DC
  Administered 2020-11-24 – 2020-11-25 (×2): 30 mg via ORAL
  Filled 2020-11-24 (×2): qty 1

## 2020-11-24 MED ORDER — BENZOCAINE-MENTHOL 20-0.5 % EX AERO
1.0000 "application " | INHALATION_SPRAY | CUTANEOUS | Status: DC | PRN
  Administered 2020-11-24: 1 via TOPICAL
  Filled 2020-11-24: qty 56

## 2020-11-24 MED ORDER — ETONOGESTREL 68 MG ~~LOC~~ IMPL
68.0000 mg | DRUG_IMPLANT | Freq: Once | SUBCUTANEOUS | Status: AC
  Administered 2020-11-25: 68 mg via SUBCUTANEOUS
  Filled 2020-11-24: qty 1

## 2020-11-24 MED ORDER — SIMETHICONE 80 MG PO CHEW: 80.0000 mg | CHEWABLE_TABLET | ORAL | Status: DC | PRN

## 2020-11-24 MED ORDER — ONDANSETRON HCL 4 MG/2ML IJ SOLN: 4.0000 mg | INTRAMUSCULAR | Status: DC | PRN

## 2020-11-24 MED ORDER — IBUPROFEN 600 MG PO TABS
600.0000 mg | ORAL_TABLET | Freq: Four times a day (QID) | ORAL | Status: DC
  Administered 2020-11-24 – 2020-11-25 (×6): 600 mg via ORAL
  Filled 2020-11-24 (×6): qty 1

## 2020-11-24 MED ORDER — ONDANSETRON HCL 4 MG PO TABS: 4.0000 mg | ORAL_TABLET | ORAL | Status: DC | PRN

## 2020-11-24 MED ORDER — LIDOCAINE HCL 1 % IJ SOLN
0.0000 mL | Freq: Once | INTRAMUSCULAR | Status: AC | PRN
  Administered 2020-11-25: 2 mL via INTRADERMAL
  Filled 2020-11-24: qty 20

## 2020-11-24 MED ORDER — SENNOSIDES-DOCUSATE SODIUM 8.6-50 MG PO TABS
2.0000 | ORAL_TABLET | Freq: Every day | ORAL | Status: DC
  Administered 2020-11-25: 2 via ORAL
  Filled 2020-11-24: qty 2

## 2020-11-24 MED ORDER — WITCH HAZEL-GLYCERIN EX PADS: 1.0000 "application " | MEDICATED_PAD | CUTANEOUS | Status: DC | PRN

## 2020-11-24 MED ORDER — TETANUS-DIPHTH-ACELL PERTUSSIS 5-2.5-18.5 LF-MCG/0.5 IM SUSY: 0.5000 mL | PREFILLED_SYRINGE | Freq: Once | INTRAMUSCULAR | Status: DC

## 2020-11-24 MED ORDER — DIBUCAINE (PERIANAL) 1 % EX OINT: 1.0000 "application " | TOPICAL_OINTMENT | CUTANEOUS | Status: DC | PRN

## 2020-11-24 MED ORDER — ACETAMINOPHEN 325 MG PO TABS
650.0000 mg | ORAL_TABLET | ORAL | Status: DC | PRN
  Administered 2020-11-24: 650 mg via ORAL
  Filled 2020-11-24: qty 2

## 2020-11-24 MED ORDER — COCONUT OIL OIL
1.0000 "application " | TOPICAL_OIL | Status: DC | PRN
  Administered 2020-11-25: 1 via TOPICAL

## 2020-11-24 MED ORDER — DIPHENHYDRAMINE HCL 25 MG PO CAPS: 25.0000 mg | ORAL_CAPSULE | Freq: Four times a day (QID) | ORAL | Status: DC | PRN

## 2020-11-24 NOTE — Lactation Note (Signed)
This note was copied from a baby's chart. Lactation Consultation Note  Patient Name: Alexis Blanchard INOMV'E Date: 11/24/2020 Reason for consult: Initial assessment;Mother's request;Early term 69-38.6wks Age: 23 hrs  Mom fed infant for 18 min 1 hr prior to visit. Mom stated right nipple inverted but will evert with stimulation and pre pumping. Mom has no signs of nipple trauma or compression. Mom pre pumping with manual preferred the DEBP.  LC sized flanges with 27 on right and 24 on left. Mom understands with pumping nipple size can change and she may need to adjust accordingly.  Mom denied any pain with flange sizes used during the visit.   LC reviewed needs of LPTI to reduce calorie loss including keeping total feeding under 30 mins.  Plan 1. To feed based on cues 8-12x in 24 hr period no more than 3 hrs without an attempt. Mom to offer both breasts and listen for swallows.  2. Mom to supplement EBM after latching via curve tip syringe and finger feeding. Mom to call RN to assist. LPTI breastfeeding supplementation guide provided.  3. Mom pumping with DEBP q 3 hrs for 15 min 4 I and O sheet reviewed.  5 LC brochure of inpatient and outpatient services provided.  All questions answered at the end of the visit.   Maternal Data Has patient been taught Hand Expression?: Yes Does the patient have breastfeeding experience prior to this delivery?: Yes How long did the patient breastfeed?: 2 years  Feeding Mother's Current Feeding Choice: Breast Milk  LATCH Score Latch: Grasps breast easily, tongue down, lips flanged, rhythmical sucking.  Audible Swallowing: Spontaneous and intermittent  Type of Nipple: Inverted (right side inverted)  Comfort (Breast/Nipple): Soft / non-tender  Hold (Positioning): Assistance needed to correctly position infant at breast and maintain latch.  LATCH Score: 7   Lactation Tools Discussed/Used Tools: Pump;Flanges Flange Size: 24;27 (27 on Right  inverted nipple will evert with stimulation, 24 on left) Breast pump type: Double-Electric Breast Pump Pump Education: Setup, frequency, and cleaning;Milk Storage Reason for Pumping: increase stimulation/mother request Pumping frequency: every 3 hrs for 15 min  Interventions Interventions: Breast feeding basics reviewed;Support pillows;Education;Expressed milk;Skin to skin;Breast massage;Hand express;DEBP;Breast compression  Discharge Pump: Manual WIC Program: Yes  Consult Status Consult Status: Follow-up Date: 11/25/20 Follow-up type: In-patient    Alexis Blanchard  Alexis Blanchard 11/24/2020, 4:42 PM

## 2020-11-24 NOTE — Progress Notes (Addendum)
Labor Progress Note Alexis Blanchard is a 25 y.o. [phone] at [redacted]w[redacted]d presented for IOL-gHTN.  S: Doing well without complaints. Much more comfortable after replacement of epidural  O:  BP (!) 143/92   Pulse (!) 104   Temp 99.9 F (37.7 C) (Axillary)   Resp 20   Ht 5\' 2"  (1.575 m)   Wt 72.2 kg   LMP 03/09/2020   SpO2 98%   BMI 29.12 kg/m  EFM: 150 bpm/mod variability/+accels/early and variable decels Toco: q1-3 min MVU 180-220, adequate since 0000  [name]: Dilation: 6 Effacement (%): 90 Station: -1 Presentation: Vertex Exam by:: Dr. [email or web address]   A&P: 25 y.o. 30 [redacted]w[redacted]d presented for IOL-gHTN. #IOL/TOLAC: S/p FB. Pitocin started @0200  on 7/18, currently at 40mL/hr. AROM @1115 . IUPC placed @1538 .  Continue to titrate pitocin per protocol.  Cervix to be rechecked in one hour or earlier as needed to monitor progress. #gHTN: preE labs nml, asymptomatic, no severe range BP. Continue to monitor. #Pain: Comfortable with epidural #FWB: Overall reassuring, #GBS positive on PCN, has received adequate prophylaxis #MOD: Hopeful for VBAC   8/18, MD 12:43 AM

## 2020-11-24 NOTE — Progress Notes (Signed)
Labor Progress Note SHONDREA STEINERT is a 25 y.o. [phone] at [redacted]w[redacted]d presented for IOL-gHTN.  S: Doing well without complaints. Comfortable.  O:  BP (!) 149/101   Pulse (!) 118   Temp 98.9 F (37.2 C) (Axillary)   Resp 20   Ht 5\' 2"  (1.575 m)   Wt 72.2 kg   LMP 03/09/2020   SpO2 97%   BMI 29.12 kg/m  EFM: 130 bpm/mod variability/+accels/early and variable decels Toco: q1-3 min MVU 145 currently, inadequate currently. Pitocin at 18 mu/min  [name]: Dilation: 7.5 Effacement (%): 90 Station: 0 Presentation: Vertex Exam by:: Dr. [email or web address] Most of cervix on her right, position changed to helped with this.  FSE placed given coincidence of fetal HR with maternal HR, need to differentiate heart rates adequately. Also want to monitor fetus more directly given variable decels.   A&P: 25 y.o. 30 [redacted]w[redacted]d presented for IOL-gHTN. #IOL/TOLAC: Good cervical change and descent.  Continue to titrate pitocin per protocol.   #gHTN: preE labs nml, asymptomatic, no severe range BP. Continue to monitor. #Pain: Comfortable with epidural #FWB: Overall reassuring, had some variables with position change but stabilized after a few minutes. Being monitored via FSE now.  #ID: GBS positive on PCN, has received adequate prophylaxis #MOD: Hopeful for VBAC   [redacted]w[redacted]d, MD 1:43 AM

## 2020-11-24 NOTE — Progress Notes (Signed)
Labor Progress Note Alexis Blanchard is a 25 y.o. [phone] at [redacted]w[redacted]d presented for  IOL-gHTN.  S: Patient is resting comfortably in throne position.  O:  BP 137/74   Pulse 99   Temp 98.8 F (37.1 C) (Axillary)   Resp 20   Ht 5\' 2"  (1.575 m)   Wt 72.2 kg   LMP 03/09/2020   SpO2 97%   BMI 29.12 kg/m  EFM: 130 BPM/good variability/+accels/variable decel  [name]: Dilation: 10 Dilation Complete Date: 11/24/20 Dilation Complete Time: 0532 Effacement (%): 100 Station: Plus 1 Presentation: Vertex Exam by:: Dr. [email or web address]   A&P: 25 y.o. 30 [redacted]w[redacted]d  IOL-gHTN. #Labor: Progressing well. Now that the pt is complete we can start pushing.  #Pain: epidural #FWB: Reassuring, has had few variable decels.  #ID: GBS positive>PCN   [redacted]w[redacted]d, MD, PGY1 Center for [facility]o Martinez, [facility] 5:37 AM

## 2020-11-24 NOTE — Discharge Summary (Signed)
Postpartum Discharge Summary  Date of Service updated 11/25/20     Patient Name: Alexis Blanchard DOB: 1995-09-27 MRN: [id number]  Date of admission: 11/23/2020 Delivery date:11/24/2020  Delivering provider: Erskine Emery  Date of discharge: 11/25/2020  Admitting diagnosis: Gestational hypertension, third trimester [O13.3] Intrauterine pregnancy: [redacted]w[redacted]d    Secondary diagnosis:  Active Problems:   Alpha thalassemia silent carrier   History of cesarean delivery affecting pregnancy   Supervision of high risk pregnancy, antepartum   ASCUS with positive high risk HPV cervical on 05/20/2020   COVID-19 affecting pregnancy in first trimester   Gestational hypertension, third trimester   VBAC (vaginal birth after Cesarean)  Additional problems: none    Discharge diagnosis: Term Pregnancy Delivered, VBAC, and Gestational Hypertension                                              Post partum procedures: n/a Augmentation: AROM, Pitocin, and IP Foley Complications: None  Hospital course: Induction of Labor With Vaginal Delivery   25y.o. yo [phone] [id number] admitted to the hospital 11/23/2020 for induction of labor.  Indication for induction: Gestational hypertension.  Patient had an uncomplicated labor course as follows: Membrane Rupture Time/Date: 11:14 AM ,11/23/2020   Delivery Method:VBAC, Spontaneous  Episiotomy: None  Lacerations:  Labial  Details of delivery can be found in separate delivery note.  Patient had a routine postpartum course. Patient is discharged home 11/25/20.  Newborn Data: Birth date:11/24/2020  Birth time:5:56 AM  Gender:Female  Living status:Living  Apgars:8 ,9  Weight:2875 g   Magnesium Sulfate received: No BMZ received: No Rhophylac:N/A MMR:N/A T-DaP:Given prenatally Flu: No Transfusion:No  Physical exam  Vitals:   11/24/20 1330 11/24/20 1730 11/24/20 2131 11/25/20 0521  BP: (!) 147/91 137/84 124/77 127/83  Pulse: 85 89 92 85  Resp: '16 16 18 18   ' Temp: 98.2 F (36.8 C) 98.1 F (36.7 C) 98.3 F (36.8 C) 98.3 F (36.8 C)  TempSrc: Oral Oral Oral Oral  SpO2:   100% 100%  Weight:      Height:       General: alert, cooperative, and no distress Lochia: appropriate Uterine Fundus: firm Incision: N/A DVT Evaluation: No evidence of DVT seen on physical exam. Labs: Lab Results  Component Value Date   WBC 15.6 (H) 11/24/2020   HGB 12.7 11/24/2020   HCT 37.4 11/24/2020   MCV 81.8 11/24/2020   PLT 303 11/24/2020   CMP Latest Ref Rng & Units 11/24/2020  Glucose 70 - 99 mg/dL 113(H)  BUN 6 - 20 mg/dL <5(L)  Creatinine 0.44 - 1.00 mg/dL 0.66  Sodium 135 - 145 mmol/L 135  Potassium 3.5 - 5.1 mmol/L 3.4(L)  Chloride 98 - 111 mmol/L 106  CO2 22 - 32 mmol/L 22  Calcium 8.9 - 10.3 mg/dL 8.5(L)  Total Protein 6.5 - 8.1 g/dL 5.2(L)  Total Bilirubin 0.3 - 1.2 mg/dL 0.8  Alkaline Phos 38 - 126 U/L 130(H)  AST 15 - 41 U/L 27  ALT 0 - 44 U/L 12   Edinburgh Score: No flowsheet data found.   After visit meds:  Allergies as of 11/25/2020   No Known Allergies      Medication List     STOP taking these medications    cyclobenzaprine 10 MG tablet Commonly known as: FLEXERIL  TAKE these medications    acetaminophen 500 MG tablet Commonly known as: TYLENOL Take 2 tablets (1,000 mg total) by mouth every 6 (six) hours as needed (for pain scale < 4).   Blood Pressure Kit Devi 1 Device by Does not apply route once a week. To be monitored weekly from home   coconut oil Oil Apply 1 application topically as needed.   Concept OB 130-92.4-1 MG Caps Take 1 tablet by mouth daily.   ibuprofen 600 MG tablet Commonly known as: ADVIL Take 1 tablet (600 mg total) by mouth every 6 (six) hours as needed.   NIFEdipine 30 MG 24 hr tablet Commonly known as: ADALAT CC Take 1 tablet (30 mg total) by mouth daily.         Discharge home in stable condition Infant Feeding: Bottle and Breast Infant Disposition:home with  mother Discharge instruction: per After Visit Summary and Postpartum booklet. Activity: Advance as tolerated. Pelvic rest for 6 weeks.  Diet: routine diet Future Appointments:No future appointments. Follow up Visit: Message sent to [facility] 11/24/20 by Sylvester Harder.   Please schedule this patient for a In person postpartum visit in 6 weeks with the following provider: Any provider. Additional Postpartum F/U:BP check 1 week  High risk pregnancy complicated by: HTN Delivery mode:  VBAC, Spontaneous  Anticipated Birth Control:  PP Nexplanon placed   7/49/4496 Arrie Senate, MD

## 2020-11-24 NOTE — Anesthesia Postprocedure Evaluation (Signed)
Anesthesia Post Note  Patient: Alexis Blanchard  Procedure(s) Performed: AN AD HOC LABOR EPIDURAL     Patient location during evaluation: Mother Baby Anesthesia Type: Epidural Level of consciousness: awake Pain management: satisfactory to patient Vital Signs Assessment: post-procedure vital signs reviewed and stable Respiratory status: spontaneous breathing Cardiovascular status: stable Anesthetic complications: no   No notable events documented.  Last Vitals:  Vitals:   11/24/20 0930 11/24/20 1330  BP: (!) 152/92 (!) 147/91  Pulse: 86 85  Resp: 17 16  Temp: (!) 36.4 C 36.8 C  SpO2:      Last Pain:  Vitals:   11/24/20 1530  TempSrc:   PainSc: 0-No pain   Pain Goal:                   [employer]

## 2020-11-25 ENCOUNTER — Other Ambulatory Visit (HOSPITAL_COMMUNITY): Payer: Self-pay

## 2020-11-25 DIAGNOSIS — Z30017 Encounter for initial prescription of implantable subdermal contraceptive: Secondary | ICD-10-CM

## 2020-11-25 MED ORDER — ACETAMINOPHEN 500 MG PO TABS: 1000.0000 mg | ORAL_TABLET | Freq: Four times a day (QID) | ORAL | Status: DC | PRN

## 2020-11-25 MED ORDER — IBUPROFEN 600 MG PO TABS: 600.0000 mg | ORAL_TABLET | Freq: Four times a day (QID) | ORAL | 0 refills | Status: DC | PRN

## 2020-11-25 MED ORDER — COCONUT OIL OIL: 1.0000 "application " | TOPICAL_OIL | 0 refills | Status: DC | PRN

## 2020-11-25 MED ORDER — NIFEDIPINE ER 30 MG PO TB24
30.0000 mg | ORAL_TABLET | Freq: Every day | ORAL | 0 refills | Status: DC
  Filled 2020-11-25: qty 90, 90d supply, fill #0

## 2020-11-25 NOTE — Procedures (Signed)
GYNECOLOGY PROCEDURE NOTE  Alexis Blanchard is a 25 y.o. [id number] requesting Nexplanon insertion. No gynecologic concerns.  Nexplanon Insertion Procedure Patient identified, informed consent performed, consent signed. Patient does understand that irregular bleeding is a very common side effect of this medication. She was advised to have backup contraception for one week after placement. Appropriate time out taken. Patient's right arm was prepped and draped in the usual sterile fashion. The insertion area was measured and marked. Patient was prepped with alcohol swab and then injected with 3 ml of 1% lidocaine. The area was then prepped with betadine. Nexplanon removed from packaging and device confirmed present within needle, then inserted full length of needle and withdrawn per handbook instructions. Nexplanon was able to palpated in the patient's arm; patient palpated the insert herself. There was minimal blood loss. Patient insertion site covered with steri strip, guaze, and a pressure bandage to reduce any bruising. The patient tolerated the procedure well and was given post procedure instructions.

## 2020-11-25 NOTE — Lactation Note (Addendum)
This note was copied from a baby's chart. Lactation Consultation Note  Patient Name: Alexis Blanchard [id number]'[id number] Date: 11/25/2020 Reason for consult: Follow-up assessment;Nipple pain/trauma;Mother's request Age:23 hours  P2, Baby sleeping.  Photographer in room for session. Mother wanted LC to look at crack at base of R nipple. She states she is using #24 flange which mother states fits well.  This nipple was initially inverted.  Provided mother with coconut oil and suggest trying soft sided 24 flange in manual pump kit.  Mother is aware of engorgement care.   Mother will call for help as needed with pain.   Feeding Mother's Current Feeding Choice: Breast Milk     Lactation Tools Discussed/Used Tools: Coconut oil;Pump Flange Size: 24;27 (27 R, 24 L) Breast pump type: Double-Electric Breast Pump Reason for Pumping: stimulation and supplementation  Interventions Interventions: Education;Coconut oil   Consult Status Consult Status: Follow-up Date: 11/26/20 Follow-up type: In-patient    Vivianne Master [facility] 11/25/2020, 11:27 AM

## 2020-11-25 NOTE — Social Work (Signed)
CSW received consult due to score of 6 on the [place] Postpartum Depression Screen, with an answer of 1 to question 10.    CSW met with MOB to assess and offer support. CSW introduced self and role. CSW observed MOB's sister also present and received permission to speak with MOB alone. CSW informed MOB of reason for consult and assessed emotions. MOB reported she is currently doing well, although she is in pain due to the epidural. CSW asked MOB if she has had thoughts of harming herself. MOB stated she has not had any thoughts of harming herself and that she answered the question incorrectly. MOB reported she has no mental health and denies any current mental health concerns. MOB denies any current SI, HI or being involved in DV.   CSW provided education regarding the baby blues period versus PPD. CSW provided the New Mom Checklist and encouraged MOB to self evaluate and contact a medical professional if symptoms are noted at any time.   CSW provided review of Sudden Infant Death Syndrome (SIDS) precautions.   CSW identifies no further need for intervention and no barriers to discharge at this time.  Darra Lis, [place] [employer] [phone]

## 2020-11-26 ENCOUNTER — Ambulatory Visit: Payer: Self-pay

## 2020-11-26 NOTE — Lactation Note (Signed)
This note was copied from a baby's chart. Lactation Consultation Note  Patient Name: Alexis Blanchard [id number] Date: 11/26/2020 Reason for consult: Follow-up assessment;Early term 37-38.6wks;Infant weight loss;Other (Comment);Nipple pain/trauma;Hyperbilirubinemia (7 % weight loss/ baby was placed on photo tx today. LC encouraged mom to call with feeding cues and to have flanged checked for comfort.) Age:23 hours LC reviewed and updated the doc flow sheets per mom.  LC discussed the important reason for post pumping to enhance the mature milk coming in. Per mom when she used the DEBP had some bleeding from the right nipple.  LC encouraged to call for LC to assess feeding and pumping.   Maternal Data    Feeding Mother's Current Feeding Choice: Breast Milk  LATCH Score                    Lactation Tools Discussed/Used Tools: Pump;Flanges Flange Size: 24;27 Breast pump type: Double-Electric Breast Pump Pump Education: Milk Storage Reason for Pumping: baby now less than 6 pounds on photo tx , 7 % weight loss - LC recommended post pumping after BF  Interventions    Discharge    Consult Status Consult Status: Follow-up Date: 11/26/20    Kathrin Greathouse 11/26/2020, 2:40 PM

## 2020-11-27 ENCOUNTER — Ambulatory Visit: Payer: Self-pay

## 2020-11-27 NOTE — Lactation Note (Signed)
This note was copied from a baby's chart. Lactation Consultation Note  Patient Name: Alexis Blanchard [id number] Date: 11/27/2020 Reason for consult: Follow-up assessment;Early term 37-38.6wks;Infant < 6lbs;Hyperbilirubinemia;Other (Comment) (off the Bili lights and repeat at 1400.) Age:23 days Baby for D/C - bili check - ok .  [place] visited mom earlier and her milk is in and per plan is to either latch the baby to feed or give the baby EBM from the bottle.  LC reviewed BF teaching/ faxed another [facility] referral for a DEBP .  Mom has a hand pump and the DEBP kit.  Mom denies engorgement or sore nipples.  LC reviewed the doc flow sheets and updated per mom.  Mom has the [facility] brochure with resource numbers.   Maternal Data    Feeding Mother's Current Feeding Choice: Breast Milk  LATCH Score                    Lactation Tools Discussed/Used    Interventions    Discharge    Consult Status Consult Status: Complete Date: 11/27/20    Myer Haff 11/27/2020, 3:43 PM

## 2020-12-01 ENCOUNTER — Other Ambulatory Visit: Payer: Self-pay

## 2020-12-01 ENCOUNTER — Ambulatory Visit (INDEPENDENT_AMBULATORY_CARE_PROVIDER_SITE_OTHER): Payer: Medicaid Other | Admitting: *Deleted

## 2020-12-01 DIAGNOSIS — O133 Gestational [pregnancy-induced] hypertension without significant proteinuria, third trimester: Secondary | ICD-10-CM

## 2020-12-01 NOTE — Progress Notes (Signed)
Subjective:  Alexis Blanchard is a 25 y.o. female here for BP check.   Hypertension ROS: taking medications as instructed, no medication side effects noted, no TIA's, no chest pain on exertion, no dyspnea on exertion, and no swelling of ankles.    Objective:  BP 129/78   Pulse (!) 109   Appearance alert, well appearing, and in no distress. General exam BP noted to be well controlled today in office.    Assessment:   Blood Pressure well controlled.   Plan:  Follow up as needed and or at postpartum visit.  Marland Kitchen

## 2020-12-01 NOTE — Progress Notes (Signed)
Attestation of Attending Supervision of clinical support staff: I agree with the care provided to this patient and was available for any consultation.  I have reviewed the RN's note and chart. I was available for consult and to see the patient if needed.   Rolla Kedzierski Niles Brissia Delisa, MD, MPH, ABFM Attending Physician Faculty Practice- [facility]  

## 2020-12-05 ENCOUNTER — Telehealth (HOSPITAL_COMMUNITY): Payer: Self-pay

## 2020-12-05 NOTE — Telephone Encounter (Addendum)
"  I'm doing well. I'm feeling good." Patient declines questions or concerns about her healing.  "We went to his doctor appointment and they checked his jaundice and it was 18 so they did some blood work. They called and said his level in his blood was ok so we didn't have to go to the hospital. They told me to just get him in the sunlight." RN explained putting baby in indirect sunlight to help with jaundice. RN asks patient if she has any questions or concerns about baby? "No. Just his eye color, the jaundice. It is getting better now though."  EPDS score is 4.  Marcelino Duster [facility] 12/05/2020,1111

## 2021-01-05 ENCOUNTER — Ambulatory Visit: Payer: Medicaid Other | Admitting: Obstetrics & Gynecology

## 2021-04-08 DIAGNOSIS — Z419 Encounter for procedure for purposes other than remedying health state, unspecified: Secondary | ICD-10-CM | POA: Diagnosis not present

## 2021-05-09 DIAGNOSIS — Z419 Encounter for procedure for purposes other than remedying health state, unspecified: Secondary | ICD-10-CM | POA: Diagnosis not present

## 2021-05-13 IMAGING — US US OB COMP LESS 14 WK
1 series · 15 of 28 positions shown · non-contrast
Comparison: Prior gestation obstetrical ultrasound 04/24/2019

CLINICAL DATA: Vaginal bleeding, quantitative hCG 121,504

EXAM:
OBSTETRIC <14 WK ULTRASOUND
TECHNIQUE: Transabdominal ultrasound was performed for evaluation of the
gestation as well as the maternal uterus and adnexal regions.

[Series 1: us ob comp less 14 wk · 15 of 40 slices shown]
[im 1/40]
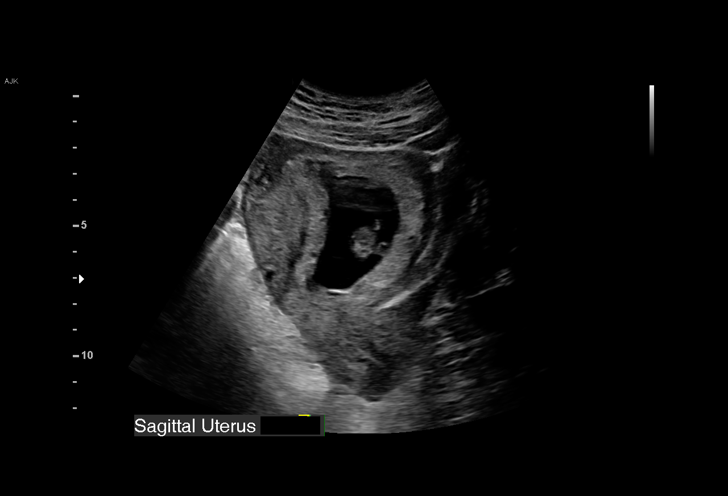
[im 3/40]
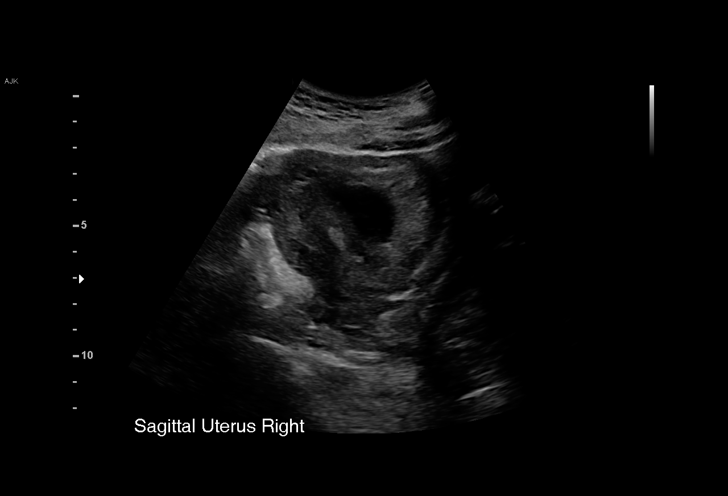
[im 6/40]
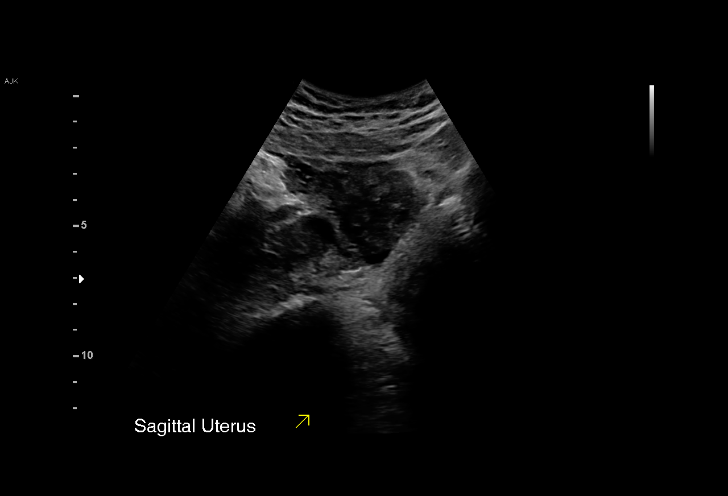
[im 9/40]
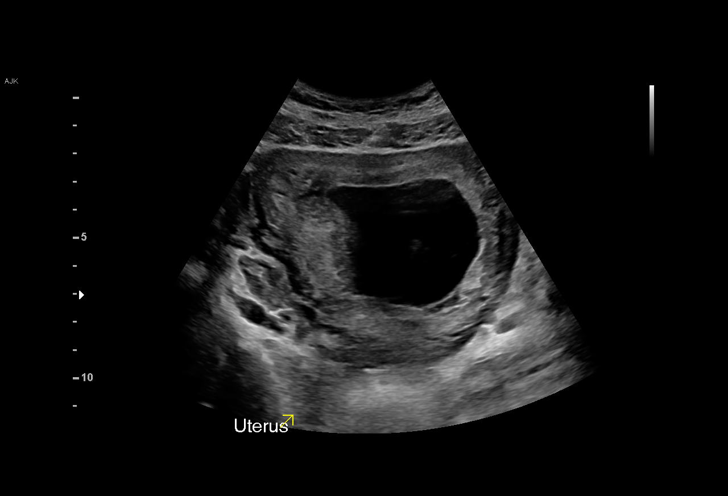
[im 12/40]
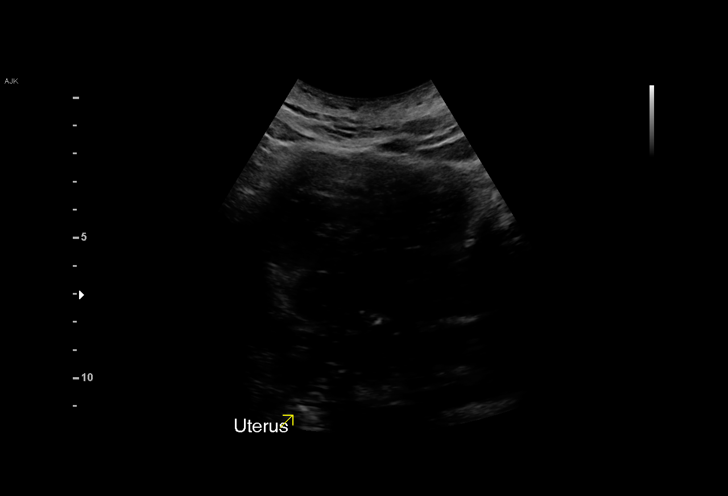
[im 15/40]
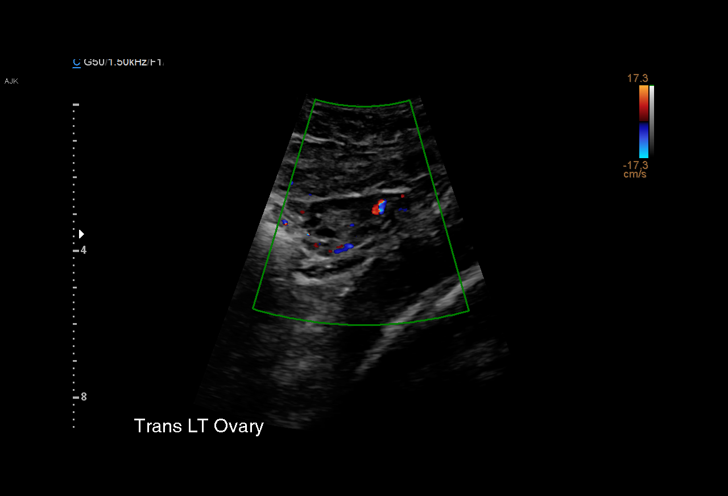
[im 18/40]
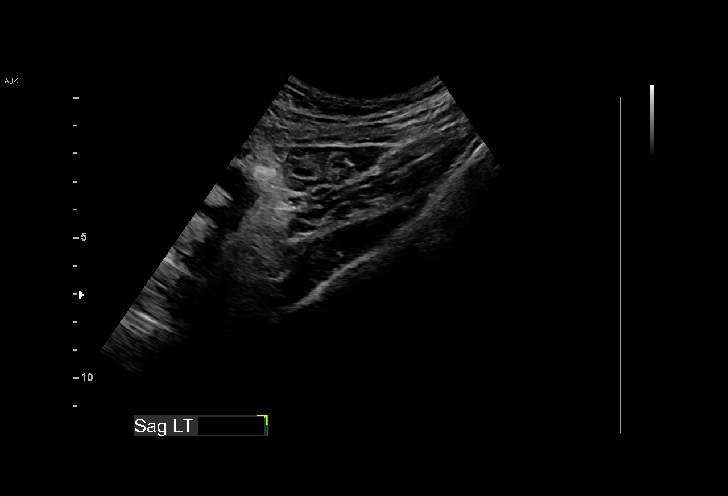
[im 21/40]
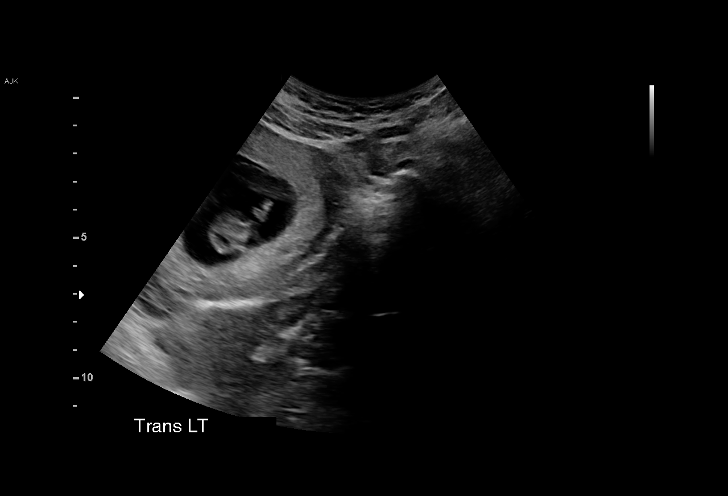
[im 22/40]
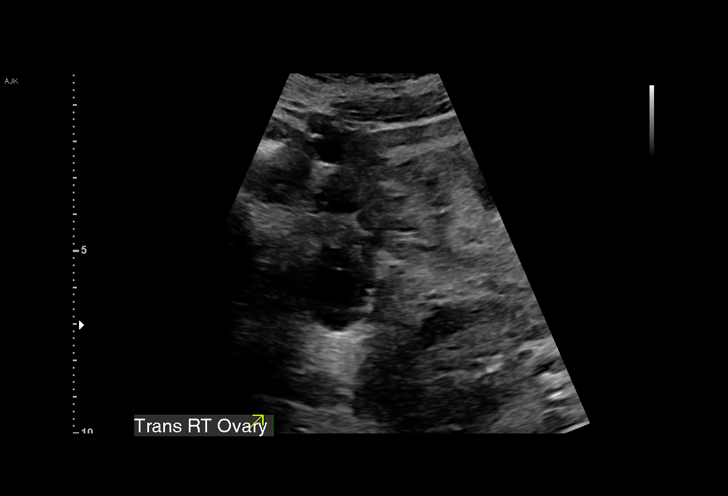
[im 25/40]
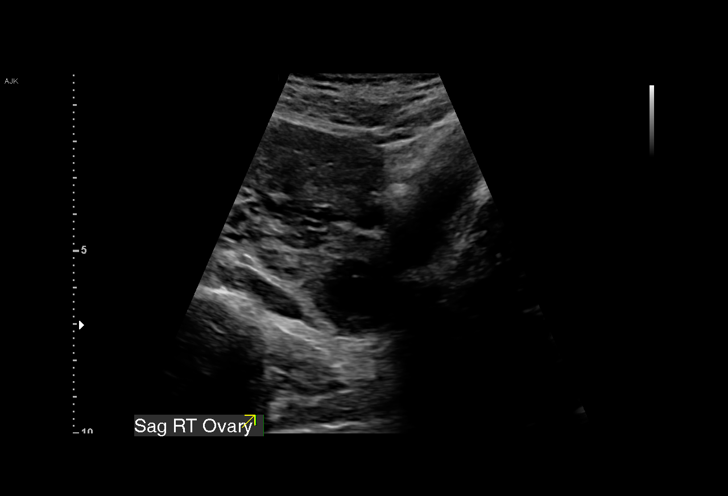
[im 28/40]
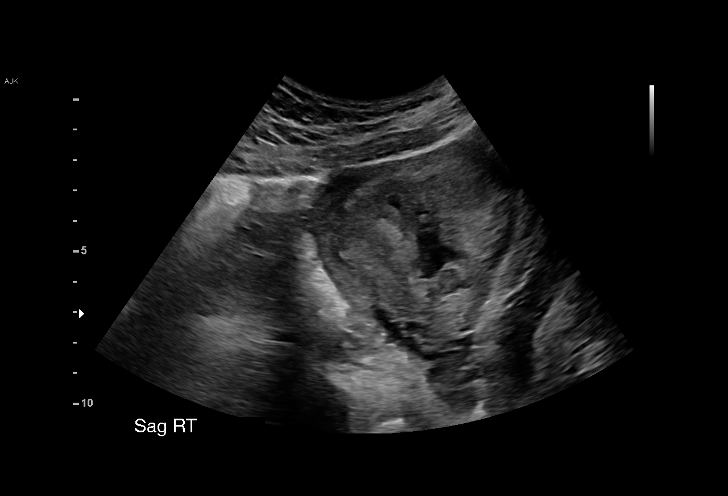
[im 31/40]
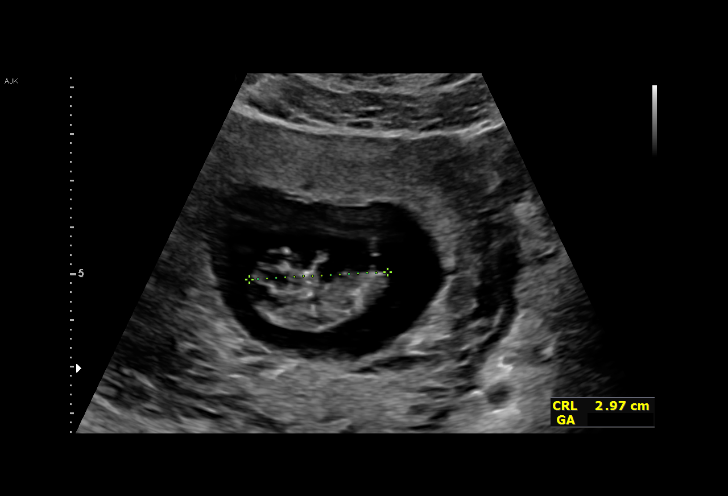
[im 34/40]
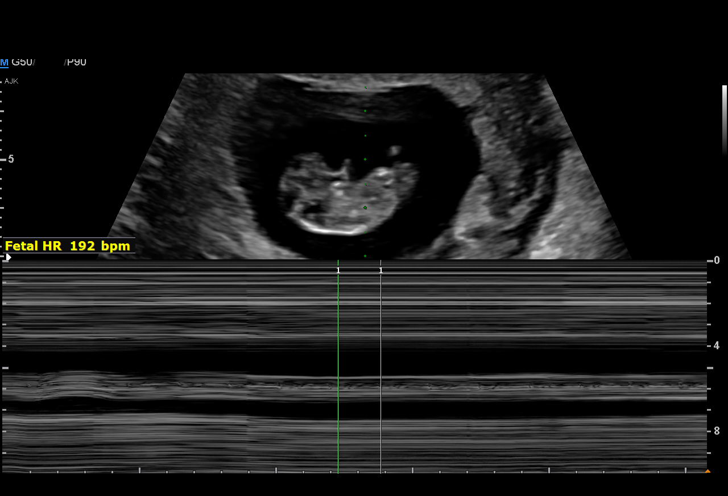
[im 37/40]
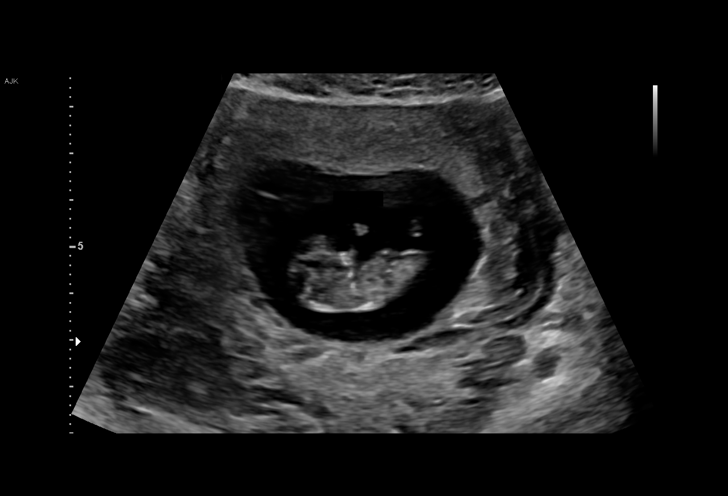
[im 40/40]
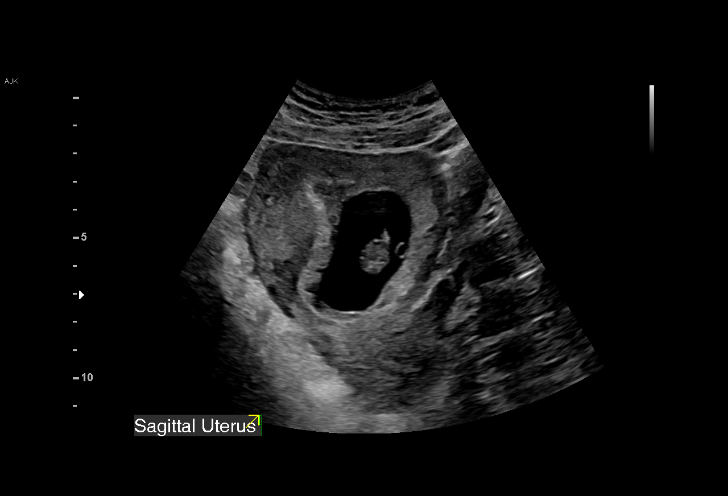

[15 of 28 positions shown; findings below may reference images not displayed]

FINDINGS: Intrauterine gestational sac: Single

Yolk sac:  Visualized.

Embryo:  Visualized.

Cardiac Activity: Visualized.

Heart Rate: 164 bpm

CRL:   29.9 mm   9 w 5 d                  US EDC: 12/14/2020

Subchorionic hemorrhage:  None visualized.

Maternal uterus/adnexae: Maternal uterus is otherwise unremarkable.
Normal appearance of the bilateral ovaries without concerning
adnexal lesion. No free fluid in the pelvis.
IMPRESSION: Single intrauterine gestation at 9 weeks, 5 days by crown-rump
length sonographic estimation.

No acute sonographic complications are evident.

## 2021-06-09 DIAGNOSIS — Z419 Encounter for procedure for purposes other than remedying health state, unspecified: Secondary | ICD-10-CM | POA: Diagnosis not present

## 2021-07-07 DIAGNOSIS — Z419 Encounter for procedure for purposes other than remedying health state, unspecified: Secondary | ICD-10-CM | POA: Diagnosis not present

## 2021-07-08 ENCOUNTER — Ambulatory Visit (HOSPITAL_COMMUNITY)
Admission: EM | Admit: 2021-07-08 | Discharge: 2021-07-08 | Disposition: A | Discharge status: Home / Self Care | Payer: Medicaid Other | Attending: Family Medicine | Admitting: Family Medicine

## 2021-07-08 ENCOUNTER — Encounter (HOSPITAL_COMMUNITY): Payer: Self-pay | Admitting: *Deleted

## 2021-07-08 ENCOUNTER — Other Ambulatory Visit: Payer: Self-pay

## 2021-07-08 DIAGNOSIS — Z20822 Contact with and (suspected) exposure to covid-19: Secondary | ICD-10-CM | POA: Insufficient documentation

## 2021-07-08 DIAGNOSIS — J069 Acute upper respiratory infection, unspecified: Secondary | ICD-10-CM | POA: Insufficient documentation

## 2021-07-08 DIAGNOSIS — R059 Cough, unspecified: Secondary | ICD-10-CM | POA: Insufficient documentation

## 2021-07-08 NOTE — Discharge Instructions (Addendum)
?  You have been swabbed for COVID, and the test will result in the next 24 hours. Our staff will call you if positive. If the test is positive, you should quarantine for 5 days.  ? ?You can take Mucinex DM or Robitussin-DM as needed for the cough.  Try using a humidifier for the congestion.  If your nasal congestion is really bothering you, you could use some nasal decongestant like Afrin or Neo-Synephrine, but do not use it more than 3 days in a row ?

## 2021-07-08 NOTE — ED Triage Notes (Signed)
PT reports Sx's started on Monday with a sore throat,Ha,runny nose and cough. ?

## 2021-07-08 NOTE — ED Provider Notes (Signed)
?[facility] ? ? ? ?CSN: [id number] ?Arrival date & time: 07/08/21  1553 ? ? ?  ? ?History   ?Chief Complaint ?Chief Complaint  ?Patient presents with  ? Cough  ? Sore Throat  ? Nasal Congestion  ? Headache  ? ? ?HPI ?Alexis Blanchard is a 26 y.o. female.  ? ? ?Cough ?Associated symptoms: headaches   ?Sore Throat ?Associated symptoms include headaches.  ?Headache ?Associated symptoms: cough   ?Here with a history of nasal congestion and rhinorrhea, cough, and some headache since February 27, 3 days ago.  Had a little sore throat.  She has thrown up about twice in the last 3 days, but she has been able to hold down fluids today.  She is nursing her 92-monthold infant ?Past Medical History:  ?Diagnosis Date  ? Excessive fetal growth affecting management of mother in second trimester, antepartum 10/06/2020  ? '[ ]'  f/u rpt growth Normal 2h GTT  4/12: 92%, 684g, ac 64%, FL 94%, nl AFI  ? False positive RPR test 05/20/2020  ? Negative T.pallidium confirmatory test  ? Gestational diabetes   ? first preg  ? Severe preeclampsia   ? Delivered at 34 weeks  ? Syphilis   ? UTI (urinary tract infection)   ? ? ?Patient Active Problem List  ? Diagnosis Date Noted  ? VBAC (vaginal birth after Cesarean) 11/24/2020  ? Gestational hypertension, third trimester 11/23/2020  ? Gestational hypertension 11/18/2020  ? COVID-19 affecting pregnancy in first trimester 06/16/2020  ? ASCUS with positive high risk HPV cervical on 05/20/2020 05/28/2020  ? History of severe pre-eclampsia 05/19/2020  ? History of cesarean delivery affecting pregnancy 05/19/2020  ? Supervision of high risk pregnancy, antepartum 05/19/2020  ? Group B streptococcal bacteriuria 05/06/2017  ? Alpha thalassemia silent carrier 01/31/2017  ? ? ?Past Surgical History:  ?Procedure Laterality Date  ? CESAREAN SECTION N/A 06/08/2017  ? Procedure: CESAREAN SECTION;  Surgeon: HLavonia Drafts MD;  [place] [facility]  Service: Obstetrics;  Laterality: N/A;   ? TONSILLECTOMY    ? ? ?OB History   ? ? Gravida  ?4  ? Para  ?2  ? Term  ?1  ? Preterm  ?1  ? AB  ?2  ? Living  ?2  ?  ? ? SAB  ?2  ? IAB  ?0  ? Ectopic  ?0  ? Multiple  ?0  ? Live Births  ?2  ?   ?  ?  ? ? ? ?Home Medications   ? ?Prior to Admission medications   ?Medication Sig Start Date End Date Taking? Authorizing Provider  ?acetaminophen (TYLENOL) 500 MG tablet Take 2 tablets (1,000 mg total) by mouth every 6 (six) hours as needed (for pain scale < 4). 74/09/81  FArrie Senate MD  ?Blood Pressure Monitoring (BLOOD PRESSURE KIT) DEVI 1 Device by Does not apply route once a week. To be monitored weekly from home 05/19/20   Anyanwu, USallyanne Havers MD  ?coconut oil OIL Apply 1 application topically as needed. 71/91/47  FArrie Senate MD  ?ibuprofen (ADVIL) 600 MG tablet Take 1 tablet (600 mg total) by mouth every 6 (six) hours as needed. 78/29/56  FArrie Senate MD  ?NIFEdipine (ADALAT CC) 30 MG 24 hr tablet Take 1 tablet (30 mg total) by mouth daily. 72/13/08  FArrie Senate MD  ?Prenat w/o A Vit-FeFum-FePo-FA (CONCEPT OB) 130-92.4-1 MG CAPS Take 1 tablet by mouth daily. 08/19/20  Osborne Oman, MD  ? ? ?Family History ?Family History  ?Problem Relation Age of Onset  ? Hypertension Mother   ? Heart disease Maternal Aunt   ?     CHF  ? Cancer Maternal Aunt 66  ?     colon  ? Cancer Maternal Aunt 45  ?     throat  ? Congestive Heart Failure Maternal Uncle   ? Diabetes Maternal Grandmother   ? Hypertension Maternal Grandmother   ? Heart disease Maternal Grandmother   ? ? ?Social History ?Social History  ? ?Tobacco Use  ? Smoking status: Never  ? Smokeless tobacco: Never  ?Vaping Use  ? Vaping Use: Never used  ?Substance Use Topics  ? Alcohol use: Not Currently  ?  Comment: pt states that she has not used since finding out about the pregnancy  ? Drug use: Not Currently  ?  Types: Marijuana  ?  Comment: pt states that she has not used since finding out about the pregnancy  ? ? ? ?Allergies    ?Patient has no known allergies. ? ? ?Review of Systems ?Review of Systems  ?Respiratory:  Positive for cough.   ?Neurological:  Positive for headaches.  ? ? ?Physical Exam ?Triage Vital Signs ?ED Triage Vitals  ?Enc Vitals Group  ?   BP 07/08/21 1705 125/81  ?   Pulse Rate 07/08/21 1705 93  ?   Resp 07/08/21 1705 20  ?   Temp 07/08/21 1705 98.9 ?F (37.2 ?C)  ?   Temp src --   ?   SpO2 07/08/21 1705 97 %  ?   Weight --   ?   Height --   ?   Head Circumference --   ?   Peak Flow --   ?   Pain Score 07/08/21 1700 0  ?   Pain Loc --   ?   Pain Edu? --   ?   Excl. in [place]? --   ? ?No data found. ? ?Updated Vital Signs ?BP 125/81   Pulse 93   Temp 98.9 ?F (37.2 ?C)   Resp 20   SpO2 97%   Breastfeeding Yes Comment: has not started menses with breast feeding ? ?Visual Acuity ?Right Eye Distance:   ?Left Eye Distance:   ?Bilateral Distance:   ? ?Right Eye Near:   ?Left Eye Near:    ?Bilateral Near:    ? ?Physical Exam ?Vitals reviewed.  ?Constitutional:   ?   General: She is not in acute distress. ?   Appearance: She is not toxic-appearing.  ?HENT:  ?   Right Ear: Tympanic membrane normal.  ?   Left Ear: Tympanic membrane normal.  ?   Nose: Congestion present.  ?   Mouth/Throat:  ?   Mouth: Mucous membranes are moist.  ?   Comments: Clear mucus and no erythema ?Eyes:  ?   Extraocular Movements: Extraocular movements intact.  ?   Conjunctiva/sclera: Conjunctivae normal.  ?   Pupils: Pupils are equal, round, and reactive to light.  ?Cardiovascular:  ?   Rate and Rhythm: Normal rate and regular rhythm.  ?   Heart sounds: No murmur heard. ?Pulmonary:  ?   Effort: Pulmonary effort is normal. No respiratory distress.  ?   Breath sounds: No stridor. No wheezing, rhonchi or rales.  ?Musculoskeletal:  ?   Cervical back: Neck supple.  ?Lymphadenopathy:  ?   Cervical: No cervical adenopathy.  ?Skin: ?   Capillary Refill:  Capillary refill takes less than 2 seconds.  ?   Coloration: Skin is not jaundiced or pale.  ?Neurological:  ?    General: No focal deficit present.  ?   Mental Status: She is alert and oriented to person, place, and time.  ?Psychiatric:     ?   Behavior: Behavior normal.  ? ? ? ?UC Treatments / Results  ?Labs ?(all labs ordered are listed, but only abnormal results are displayed) ?Labs Reviewed - No data to display ? ?EKG ? ? ?Radiology ?No results found. ? ?Procedures ?Procedures (including critical care time) ? ?Medications Ordered in UC ?Medications - No data to display ? ?Initial Impression / Assessment and Plan / UC Course  ?I have reviewed the triage vital signs and the nursing notes. ? ?Pertinent labs & imaging results that were available during my care of the patient were reviewed by me and considered in my medical decision making (see chart for details). ? ?  ? ?We will swab for COVID so she knows if she needs to quarantine.  Symptomatic treatment ? ? ?Final Clinical Impressions(s) / UC Diagnoses  ? ?Final diagnoses:  ?Viral upper respiratory tract infection  ? ? ? ?Discharge Instructions   ? ?  ? ?You have been swabbed for COVID, and the test will result in the next 24 hours. Our staff will call you if positive. If the test is positive, you should quarantine for 5 days.  ? ?You can take Mucinex DM or Robitussin-DM as needed for the cough.  Try using a humidifier for the congestion.  If your nasal congestion is really bothering you, you could use some nasal decongestant like Afrin or Neo-Synephrine, but do not use it more than 3 days in a row ? ? ? ? ?ED Prescriptions   ?None ?  ? ?PDMP not reviewed this encounter. ?  ?Barrett Henle, MD ?07/08/21 1737 ? ?

## 2021-07-09 LAB — SARS CORONAVIRUS 2 (TAT 6-24 HRS): SARS Coronavirus 2: NEGATIVE

## 2021-08-07 DIAGNOSIS — Z419 Encounter for procedure for purposes other than remedying health state, unspecified: Secondary | ICD-10-CM | POA: Diagnosis not present

## 2021-09-06 DIAGNOSIS — Z419 Encounter for procedure for purposes other than remedying health state, unspecified: Secondary | ICD-10-CM | POA: Diagnosis not present

## 2021-10-07 DIAGNOSIS — Z419 Encounter for procedure for purposes other than remedying health state, unspecified: Secondary | ICD-10-CM | POA: Diagnosis not present

## 2021-10-22 ENCOUNTER — Ambulatory Visit (HOSPITAL_COMMUNITY)
Admission: EM | Admit: 2021-10-22 | Discharge: 2021-10-22 | Disposition: A | Discharge status: Home / Self Care | Payer: Medicaid Other | Attending: Physician Assistant | Admitting: Physician Assistant

## 2021-10-22 ENCOUNTER — Encounter (HOSPITAL_COMMUNITY): Payer: Self-pay | Admitting: Emergency Medicine

## 2021-10-22 DIAGNOSIS — Z20822 Contact with and (suspected) exposure to covid-19: Secondary | ICD-10-CM | POA: Insufficient documentation

## 2021-10-22 DIAGNOSIS — J029 Acute pharyngitis, unspecified: Secondary | ICD-10-CM | POA: Diagnosis not present

## 2021-10-22 DIAGNOSIS — R52 Pain, unspecified: Secondary | ICD-10-CM | POA: Diagnosis not present

## 2021-10-22 DIAGNOSIS — J069 Acute upper respiratory infection, unspecified: Secondary | ICD-10-CM | POA: Diagnosis not present

## 2021-10-22 LAB — POCT INFECTIOUS MONO SCREEN, ED / UC: Mono Screen: NEGATIVE

## 2021-10-22 LAB — POCT RAPID STREP A, ED / UC: Streptococcus, Group A Screen (Direct): NEGATIVE

## 2021-10-22 MED ORDER — IBUPROFEN 600 MG PO TABS: 600.0000 mg | ORAL_TABLET | Freq: Three times a day (TID) | ORAL | 0 refills | Status: DC | PRN

## 2021-10-22 MED ORDER — LIDOCAINE VISCOUS HCL 2 % MT SOLN: 15.0000 mL | Freq: Three times a day (TID) | OROMUCOSAL | 0 refills | Status: DC | PRN

## 2021-10-22 NOTE — ED Triage Notes (Signed)
Pt c/o sore throat that started yesterday and having mucous in back of throat. Pt adds left ear pain and headache as well.

## 2021-10-22 NOTE — Discharge Instructions (Signed)
Your strep and mono were negative.  Your COVID test is pending.  We will contact you if this is positive.  I will also contact you if your throat culture is positive and we need to start antibiotics.  Gargle with warm salt water and use Tylenol for pain relief.  I have called in ibuprofen for pain.  Do not take NSAIDs with this medication including aspirin, ibuprofen/Advil, naproxen/Aleve.  Use lidocaine for sore throat.  Do not eat or drink immediately after using this medication as it increases risk of choking.  If your symptoms or not improving within a few days or if at any point anything worsens you need to be seen immediately.

## 2021-10-22 NOTE — ED Provider Notes (Signed)
[place]    CSN: [id number] Arrival date & time: 10/22/21  1537      History   Chief Complaint Chief Complaint  Patient presents with   Sore Throat   Otalgia    HPI Alexis Blanchard is a 26 y.o. female.   Patient presents today with a 1 day history of sore throat.  Reports this initially began on the right but has spread to involve the left side as well.  She reports pain is rated 9 on a 0-10 pain scale, described as aching, worse with swallowing, no alleviating factors identified.  She does report household sick contacts with sore throat that were prescribed amoxicillin but does not believe they tested positive for strep.  Denies additional known sick contacts.  Denies fever, nausea, vomiting, cough, congestion.  Does report that she had an episode of shortness of breath earlier but this has since resolved and she is currently asymptomatic.  Denies history of allergies or asthma.  Denies any recent antibiotic use.  She has not tried any over-the-counter medication for symptom management.  She is confident that she is not pregnant.    Past Medical History:  Diagnosis Date   Excessive fetal growth affecting management of mother in second trimester, antepartum 10/06/2020   '[ ]'  f/u rpt growth Normal 2h GTT  4/12: 92%, 684g, ac 64%, FL 94%, nl AFI   False positive RPR test 05/20/2020   Negative T.pallidium confirmatory test   Gestational diabetes    first preg   Severe preeclampsia    Delivered at 34 weeks   Syphilis    UTI (urinary tract infection)     Patient Active Problem List   Diagnosis Date Noted   VBAC (vaginal birth after Cesarean) 11/24/2020   Gestational hypertension, third trimester 11/23/2020   Gestational hypertension 11/18/2020   COVID-19 affecting pregnancy in first trimester 06/16/2020   ASCUS with positive high risk HPV cervical on 05/20/2020 05/28/2020   History of severe pre-eclampsia 05/19/2020   History of cesarean delivery affecting  pregnancy 05/19/2020   Supervision of high risk pregnancy, antepartum 05/19/2020   Group B streptococcal bacteriuria 05/06/2017   Alpha thalassemia silent carrier 01/31/2017    Past Surgical History:  Procedure Laterality Date   CESAREAN SECTION N/A 06/08/2017   Procedure: CESAREAN SECTION;  Surgeon: Lavonia Drafts, MD;  Location: [facility];  Service: Obstetrics;  Laterality: N/A;   TONSILLECTOMY      OB History     Gravida  4   Para  2   Term  1   Preterm  1   AB  2   Living  2      SAB  2   IAB  0   Ectopic  0   Multiple  0   Live Births  2            Home Medications    Prior to Admission medications   Medication Sig Start Date End Date Taking? Authorizing Provider  ibuprofen (ADVIL) 600 MG tablet Take 1 tablet (600 mg total) by mouth every 8 (eight) hours as needed. 10/22/21  Yes Chelby Salata K, PA-C  lidocaine (XYLOCAINE) 2 % solution Use as directed 15 mLs in the mouth or throat every 8 (eight) hours as needed for mouth pain. 10/22/21  Yes Valeta Paz, Derry Skill, PA-C  acetaminophen (TYLENOL) 500 MG tablet Take 2 tablets (1,000 mg total) by mouth every 6 (six) hours as needed (for pain scale < 4). 11/25/20  Arrie Senate, MD  Blood Pressure Monitoring (BLOOD PRESSURE KIT) DEVI 1 Device by Does not apply route once a week. To be monitored weekly from home 05/19/20   Anyanwu, Sallyanne Havers, MD  coconut oil OIL Apply 1 application topically as needed. 11/06/14   Arrie Senate, MD  Prenat w/o A Vit-FeFum-FePo-FA (CONCEPT OB) 130-92.4-1 MG CAPS Take 1 tablet by mouth daily. 08/19/20   Osborne Oman, MD    Family History Family History  Problem Relation Age of Onset   Hypertension Mother    Heart disease Maternal Aunt        CHF   Cancer Maternal Aunt 45       colon   Cancer Maternal Aunt 45       throat   Congestive Heart Failure Maternal Uncle    Diabetes Maternal Grandmother    Hypertension Maternal Grandmother    Heart disease  Maternal Grandmother     Social History Social History   Tobacco Use   Smoking status: Never   Smokeless tobacco: Never  Vaping Use   Vaping Use: Never used  Substance Use Topics   Alcohol use: Not Currently    Comment: pt states that she has not used since finding out about the pregnancy   Drug use: Not Currently    Types: Marijuana    Comment: pt states that she has not used since finding out about the pregnancy     Allergies   Patient has no known allergies.   Review of Systems Review of Systems  Constitutional:  Positive for activity change. Negative for appetite change, fatigue and fever.  HENT:  Positive for sore throat and trouble swallowing. Negative for congestion, sinus pressure and sneezing.   Respiratory:  Positive for shortness of breath (Resolved). Negative for cough.   Cardiovascular:  Negative for chest pain.  Gastrointestinal:  Negative for abdominal pain, diarrhea, nausea and vomiting.  Neurological:  Negative for dizziness, light-headedness and headaches.     Physical Exam Triage Vital Signs ED Triage Vitals  Enc Vitals Group     BP 10/22/21 1636 (!) 134/93     Pulse Rate 10/22/21 1636 (!) 124     Resp 10/22/21 1636 16     Temp 10/22/21 1636 99.7 F (37.6 C)     Temp Source 10/22/21 1636 Oral     SpO2 10/22/21 1636 96 %     Weight --      Height --      Head Circumference --      Peak Flow --      Pain Score 10/22/21 1635 9     Pain Loc --      Pain Edu? --      Excl. in [place]? --    No data found.  Updated Vital Signs BP (!) 142/97 (BP Location: Right Arm)   Pulse (!) 117   Temp 99.8 F (37.7 C)   Resp 17   SpO2 97%   Breastfeeding Yes   Visual Acuity Right Eye Distance:   Left Eye Distance:   Bilateral Distance:    Right Eye Near:   Left Eye Near:    Bilateral Near:     Physical Exam Vitals reviewed.  Constitutional:      General: She is awake. She is not in acute distress.    Appearance: Normal appearance. She is  well-developed. She is not ill-appearing.     Comments: Very pleasant female appears stated age in no acute distress  sitting comfortably in exam room  HENT:     Head: Normocephalic and atraumatic.     Right Ear: Tympanic membrane, ear canal and external ear normal. Tympanic membrane is not erythematous or bulging.     Left Ear: Tympanic membrane, ear canal and external ear normal. Tympanic membrane is not erythematous or bulging.     Nose:     Right Sinus: No maxillary sinus tenderness or frontal sinus tenderness.     Left Sinus: No maxillary sinus tenderness or frontal sinus tenderness.     Mouth/Throat:     Pharynx: Uvula midline. Posterior oropharyngeal erythema present. No oropharyngeal exudate.     Tonsils: 0 on the right. 0 on the left.  Cardiovascular:     Rate and Rhythm: Normal rate and regular rhythm.     Heart sounds: Normal heart sounds, S1 normal and S2 normal. No murmur heard. Pulmonary:     Effort: Pulmonary effort is normal.     Breath sounds: Normal breath sounds. No wheezing, rhonchi or rales.     Comments: Clear to auscultation bilaterally Lymphadenopathy:     Head:     Right side of head: No submental, submandibular or tonsillar adenopathy.     Left side of head: No submental, submandibular or tonsillar adenopathy.     Cervical: No cervical adenopathy.  Psychiatric:        Behavior: Behavior is cooperative.      UC Treatments / Results  Labs (all labs ordered are listed, but only abnormal results are displayed) Labs Reviewed  CULTURE, GROUP A STREP ([facility])  SARS CORONAVIRUS 2 (TAT 6-24 HRS)  POCT RAPID STREP A, ED / UC  POCT INFECTIOUS MONO SCREEN, ED / UC    EKG   Radiology No results found.  Procedures Procedures (including critical care time)  Medications Ordered in UC Medications - No data to display  Initial Impression / Assessment and Plan / UC Course  I have reviewed the triage vital signs and the nursing notes.  Pertinent labs &  imaging results that were available during my care of the patient were reviewed by me and considered in my medical decision making (see chart for details).     No evidence of acute infection on physical exam that would warrant initiation of antibiotics.  Strep testing was negative.  Throat culture is pending but will defer antibiotics until throat culture results are available.  Mono testing was negative.  COVID test is pending.  COVID treatment measures including Tylenol, gargling with warm salt water, sinus rinses.  She was given ibuprofen 600 mg up to 3 times a day as needed for pain relief.  Discussed that she should not take NSAIDs with this medication due to risk of GI bleeding.  She was given lidocaine to help with symptoms.  Discussed that she should not eat or drink immediately after use of medication as it increases risk of choking.  Discussed that if her symptoms are not improving within a few days she should return for reevaluation.  If at any point anything worsens and she has high fever, worsening sore throat, chest pain, shortness of breath, difficulty swallowing, muffled voice she should be seen immediately.  Work excuse note with current CDC return to work guidelines based on COVID test result was provided during visit.  Strict return precautions given.  Final Clinical Impressions(s) / UC Diagnoses   Final diagnoses:  Upper respiratory tract infection, unspecified type  Sore throat  Body aches  Discharge Instructions      Your strep and mono were negative.  Your COVID test is pending.  We will contact you if this is positive.  I will also contact you if your throat culture is positive and we need to start antibiotics.  Gargle with warm salt water and use Tylenol for pain relief.  I have called in ibuprofen for pain.  Do not take NSAIDs with this medication including aspirin, ibuprofen/Advil, naproxen/Aleve.  Use lidocaine for sore throat.  Do not eat or drink immediately after  using this medication as it increases risk of choking.  If your symptoms or not improving within a few days or if at any point anything worsens you need to be seen immediately.     ED Prescriptions     Medication Sig Dispense Auth. Provider   ibuprofen (ADVIL) 600 MG tablet Take 1 tablet (600 mg total) by mouth every 8 (eight) hours as needed. 30 tablet Mikki Ziff K, PA-C   lidocaine (XYLOCAINE) 2 % solution Use as directed 15 mLs in the mouth or throat every 8 (eight) hours as needed for mouth pain. 100 mL Jamesa Tedrick K, PA-C      PDMP not reviewed this encounter.   Terrilee Croak, PA-C 10/22/21 1742

## 2021-10-23 LAB — SARS CORONAVIRUS 2 (TAT 6-24 HRS): SARS Coronavirus 2: NEGATIVE

## 2021-10-25 LAB — CULTURE, GROUP A STREP (THRC)

## 2021-11-02 ENCOUNTER — Encounter (HOSPITAL_COMMUNITY): Payer: Self-pay

## 2021-11-02 ENCOUNTER — Ambulatory Visit (HOSPITAL_COMMUNITY)
Admission: EM | Admit: 2021-11-02 | Discharge: 2021-11-02 | Disposition: A | Discharge status: Home / Self Care | Payer: Medicaid Other | Attending: Family Medicine | Admitting: Family Medicine

## 2021-11-02 DIAGNOSIS — M654 Radial styloid tenosynovitis [de Quervain]: Secondary | ICD-10-CM | POA: Diagnosis not present

## 2021-11-02 MED ORDER — DICLOFENAC SODIUM 75 MG PO TBEC: 75.0000 mg | DELAYED_RELEASE_TABLET | Freq: Two times a day (BID) | ORAL | 0 refills | Status: DC

## 2021-11-02 MED ORDER — PREDNISONE 20 MG PO TABS: 40.0000 mg | ORAL_TABLET | Freq: Every day | ORAL | 0 refills | Status: DC

## 2021-11-03 NOTE — ED Provider Notes (Signed)
[facility]   [id number] 11/02/21 Arrival Time: 0947  ASSESSMENT & PLAN:  1. De Quervain's tenosynovitis, right    Fitted in thumb spica.  Discharge Medication List as of 11/02/2021 11:15 AM     START taking these medications   Details  diclofenac (VOLTAREN) 75 MG EC tablet Take 1 tablet (75 mg total) by mouth 2 (two) times daily., Starting Tue 11/02/2021, Normal    predniSONE (DELTASONE) 20 MG tablet Take 2 tablets (40 mg total) by mouth daily., Starting Tue 11/02/2021, Normal          Discharge Instructions      We have provided a thumb brace for comfort. You should wear this for the next 1-2 weeks; remove to bathe. Make sure you remove the brace regularly to work on moving your wrist a little so that it doesn't get too stiff.      Recommend:  Follow-up Information     Orth[facility], [facility].   Specialty: Specialist Why: If worsening or failing to improve as anticipated. Contact information: [address] [place] [address] [phone]                 Reviewed expectations re: course of current medical issues. Questions answered. Outlined signs and symptoms indicating need for more acute intervention. Patient verbalized understanding. After Visit Summary given.  SUBJECTIVE: History from: patient. Alexis Blanchard is a 26 y.o. female who reports RIGHT wrist pain; x 3-4 days; non-traumatic. Pain worse with gripping. No extremity sensation changes or weakness. No tx PTA. No h/o similar.  Past Surgical History:  Procedure Laterality Date   CESAREAN SECTION N/A 06/08/2017   Procedure: CESAREAN SECTION;  Surgeon: Willodean Rosenthal, MD;  Location: [facility]TH[facility] SUIT[facility];  Service: Obstetrics;  Laterality: N/A;   TONSILLECTOMY        OBJECTIVE:  Vitals:   11/02/21 1029 11/02/21 1030  BP:  138/83  Pulse: 89   Resp: 18 16  Temp: 98 F (36.7 C)   TempSrc: Oral   SpO2: 100% 98%    General appearance: alert; no distress HEENT:  [place]; AT Neck: supple with FROM Resp: unlabored respirations Extremities: RUE: warm with well perfused appearance; pain reported over radial side of wrist, more notable with thumb and wrist movement; no erythema or inflammation; no swelling; FROM; very tender over radial styloid CV: brisk extremity capillary refill of RUE; 2+ radial pulse of RUE. Skin: warm and dry; no visible rashes Neurologic: gait normal; normal sensation and strength of RUE Psychological: alert and cooperative; normal mood and affect     No Known Allergies  Past Medical History:  Diagnosis Date   Excessive fetal growth affecting management of mother in second trimester, antepartum 10/06/2020   [ ]  f/u rpt growth Normal 2h GTT  4/12: 92%, 684g, ac 64%, FL 94%, nl AFI   False positive RPR test 05/20/2020   Negative T.pallidium confirmatory test   Gestational diabetes    first preg   Severe preeclampsia    Delivered at 34 weeks   Syphilis    UTI (urinary tract infection)    Social History   Socioeconomic History   Marital status: Single    Spouse name: Not on file   Number of children: Not on file   Years of education: Not on file   Highest education level: Not on file  Occupational History   Not on file  Tobacco Use   Smoking status: Never   Smokeless tobacco: Never  Vaping Use  Vaping Use: Never used  Substance and Sexual Activity   Alcohol use: Not Currently    Comment: pt states that she has not used since finding out about the pregnancy   Drug use: Not Currently    Types: Marijuana    Comment: pt states that she has not used since finding out about the pregnancy   Sexual activity: Yes    Birth control/protection: None  Other Topics Concern   Not on file  Social History Narrative   Not on file   Social Determinants of Health   Financial Resource Strain: Not on file  Food Insecurity: Not on file  Transportation Needs: Not on file  Physical Activity: Not on file  Stress: Not on file   Social Connections: Not on file   Family History  Problem Relation Age of Onset   Hypertension Mother    Heart disease Maternal Aunt        CHF   Cancer Maternal Aunt 45       colon   Cancer Maternal Aunt 45       throat   Congestive Heart Failure Maternal Uncle    Diabetes Maternal Grandmother    Hypertension Maternal Grandmother    Heart disease Maternal Grandmother    Past Surgical History:  Procedure Laterality Date   CESAREAN SECTION N/A 06/08/2017   Procedure: CESAREAN SECTION;  Surgeon: Willodean Rosenthal, MD;  Location: [facility] BIRTHING SUIT[facility];  Service: Obstetrics;  Laterality: N/A;   Greig Right, MD 11/03/21 [phone]

## 2021-11-06 DIAGNOSIS — Z419 Encounter for procedure for purposes other than remedying health state, unspecified: Secondary | ICD-10-CM | POA: Diagnosis not present

## 2021-11-26 DIAGNOSIS — M7989 Other specified soft tissue disorders: Secondary | ICD-10-CM | POA: Diagnosis not present

## 2021-11-26 DIAGNOSIS — M654 Radial styloid tenosynovitis [de Quervain]: Secondary | ICD-10-CM | POA: Diagnosis not present

## 2021-12-01 ENCOUNTER — Ambulatory Visit (HOSPITAL_COMMUNITY)
Admission: EM | Admit: 2021-12-01 | Discharge: 2021-12-01 | Disposition: A | Discharge status: Home / Self Care | Payer: Medicaid Other | Attending: Physician Assistant | Admitting: Physician Assistant

## 2021-12-01 ENCOUNTER — Encounter (HOSPITAL_COMMUNITY): Payer: Self-pay

## 2021-12-01 DIAGNOSIS — M778 Other enthesopathies, not elsewhere classified: Secondary | ICD-10-CM | POA: Diagnosis not present

## 2021-12-01 DIAGNOSIS — M25531 Pain in right wrist: Secondary | ICD-10-CM

## 2021-12-01 NOTE — ED Provider Notes (Signed)
[place]    CSN: [id number] Arrival date & time: 12/01/21  9233      History   Chief Complaint Chief Complaint  Patient presents with   Wrist Pain    HPI Alexis Blanchard is a 26 y.o. female.   26 year old female presents with right wrist pain patient indicates she continues with right wrist pain and tenderness, pain is worse with motion of the thumb.  Patient indicates the pain radiates to the mid right forearm radial aspect.  Patient indicates she has breast-feeding at the present time so it limits the therapy which can be given as far as nonsteroidals.  Patient is wearing a wrist support that she has had for the past 2 weeks and it is only minimally giving her relief.  Patient continues with moderate pain with intermittent numbness and tingling of the thumb.  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  Patient had an x-ray performed of the right wrist several days ago which was negative.  Patient relates she has not had any fever or chills.   Wrist Pain    Past Medical History:  Diagnosis Date   Excessive fetal growth affecting management of mother in second trimester, antepartum 10/06/2020   '[ ]'  f/u rpt growth Normal 2h GTT  4/12: 92%, 684g, ac 64%, FL 94%, nl AFI   False positive RPR test 05/20/2020   Negative T.pallidium confirmatory test   Gestational diabetes    first preg   Severe preeclampsia    Delivered at 34 weeks   Syphilis    UTI (urinary tract infection)     Patient Active Problem List   Diagnosis Date Noted   VBAC (vaginal birth after Cesarean) 11/24/2020   Gestational hypertension, third trimester 11/23/2020   Gestational hypertension 11/18/2020   COVID-19 affecting pregnancy in first trimester 06/16/2020   ASCUS with positive high risk HPV cervical on 05/20/2020 05/28/2020   History of severe pre-eclampsia  05/19/2020   History of cesarean delivery affecting pregnancy 05/19/2020   Supervision of high risk pregnancy, antepartum 05/19/2020   Group B streptococcal bacteriuria 05/06/2017   Alpha thalassemia silent carrier 01/31/2017    Past Surgical History:  Procedure Laterality Date   CESAREAN SECTION N/A 06/08/2017   Procedure: CESAREAN SECTION;  Surgeon: Lavonia Drafts, MD;  Location: [facility];  Service: Obstetrics;  Laterality: N/A;   TONSILLECTOMY      OB History     Gravida  4   Para  2   Term  1   Preterm  1   AB  2   Living  2      SAB  2   IAB  0   Ectopic  0   Multiple  0   Live Births  2            Home Medications    Prior to Admission medications   Medication Sig Start Date End Date Taking? Authorizing Provider  acetaminophen (TYLENOL) 500 MG tablet Take 2 tablets (1,000 mg total) by mouth every 6 (six) hours as needed (for pain scale < 4). 0/07/62   Arrie Senate, MD  Blood Pressure Monitoring (BLOOD PRESSURE KIT) DEVI 1 Device by Does not apply route once a week. To be monitored weekly from home 05/19/20   Anyanwu, Sallyanne Havers, MD  coconut oil OIL Apply 1  application topically as needed. 2/67/12   Arrie Senate, MD  diclofenac (VOLTAREN) 75 MG EC tablet Take 1 tablet (75 mg total) by mouth 2 (two) times daily. 11/02/21   Vanessa Kick, MD  lidocaine (XYLOCAINE) 2 % solution Use as directed 15 mLs in the mouth or throat every 8 (eight) hours as needed for mouth pain. 10/22/21   Raspet, Derry Skill, PA-C  predniSONE (DELTASONE) 20 MG tablet Take 2 tablets (40 mg total) by mouth daily. 11/02/21   Vanessa Kick, MD  Prenat w/o A Vit-FeFum-FePo-FA (CONCEPT OB) 130-92.4-1 MG CAPS Take 1 tablet by mouth daily. 08/19/20   Osborne Oman, MD    Family History Family History  Problem Relation Age of Onset   Hypertension Mother    Heart disease Maternal Aunt        CHF   Cancer Maternal Aunt 45       colon   Cancer Maternal Aunt 45        throat   Congestive Heart Failure Maternal Uncle    Diabetes Maternal Grandmother    Hypertension Maternal Grandmother    Heart disease Maternal Grandmother     Social History Social History   Tobacco Use   Smoking status: Never   Smokeless tobacco: Never  Vaping Use   Vaping Use: Never used  Substance Use Topics   Alcohol use: Not Currently    Comment: pt states that she has not used since finding out about the pregnancy   Drug use: Not Currently    Types: Marijuana    Comment: pt states that she has not used since finding out about the pregnancy     Allergies   Patient has no known allergies.   Review of Systems Review of Systems  Musculoskeletal:  Positive for arthralgias (right wrist pain).     Physical Exam Triage Vital Signs ED Triage Vitals  Enc Vitals Group     BP 12/01/21 0918 128/88     Pulse Rate 12/01/21 0918 95     Resp 12/01/21 0918 16     Temp 12/01/21 0918 98.2 F (36.8 C)     Temp Source 12/01/21 0918 Oral     SpO2 12/01/21 0918 98 %     Weight 12/01/21 0920 145 lb (65.8 kg)     Height 12/01/21 0920 '5\' 2"'  (1.575 m)     Head Circumference --      Peak Flow --      Pain Score 12/01/21 0920 7     Pain Loc --      Pain Edu? --      Excl. in [place]? --    No data found.  Updated Vital Signs BP 128/88 (BP Location: Left Arm)   Pulse 95   Temp 98.2 F (36.8 C) (Oral)   Resp 16   Ht '5\' 2"'  (1.575 m)   Wt 145 lb (65.8 kg)   LMP  (LMP Unknown)   SpO2 98%   Breastfeeding Yes   BMI 26.52 kg/m   Visual Acuity Right Eye Distance:   Left Eye Distance:   Bilateral Distance:    Right Eye Near:   Left Eye Near:    Bilateral Near:     Physical Exam Constitutional:      Appearance: Normal appearance.  Musculoskeletal:     Comments: Right wrist: Pain is palpated along the radial lateral wrist area the thenar prominence.  Range of motion is normal with pain on flexion extension and rotation.  There is no swelling or redness present.   Strength is normal.  Neurological:     Mental Status: She is alert.      UC Treatments / Results  Labs (all labs ordered are listed, but only abnormal results are displayed) Labs Reviewed - No data to display  EKG   Radiology No results found.  Procedures Procedures (including critical care time)  Medications Ordered in UC Medications - No data to display  Initial Impression / Assessment and Plan / UC Course  I have reviewed the triage vital signs and the nursing notes.  Pertinent labs & imaging results that were available during my care of the patient were reviewed by me and considered in my medical decision making (see chart for details).    Plan: 1.  Patient will be internally referred to [facility] for evaluation of the continued wrist pain. 2.  Advised patient to continue Tylenol as needed for pain relief 3.  Advised to continue wearing the wrist support to help provide relief from the pain. 4.  Advise light duty for the next week until evaluated by orthopedics to determine treatment course. Final Clinical Impressions(s) / UC Diagnoses   Final diagnoses:  Right wrist pain  Right wrist tendonitis     Discharge Instructions      Advised to continue wearing the wrist brace for support and comfort. Advised to take Tylenol as needed for pain. Advised to ice frequently over the evening to help reduce swelling and discomfort, 10 minutes on 20 minutes off. I have made an internal referral to [facility] care.  Their office should be calling you within the next 48 hours to set up an appointment for evaluation of the wrist pain.     ED Prescriptions   None    PDMP not reviewed this encounter.   Nyoka Lint, PA-C 12/01/21 1000

## 2021-12-01 NOTE — Discharge Instructions (Addendum)
Advised to continue wearing the wrist brace for support and comfort. Advised to take Tylenol as needed for pain. Advised to ice frequently over the evening to help reduce swelling and discomfort, 10 minutes on 20 minutes off. I have made an internal referral to [facility] care.  Their office should be calling you within the next 48 hours to set up an appointment for evaluation of the wrist pain.

## 2021-12-01 NOTE — ED Triage Notes (Signed)
Right wrist pain for over 4 weeks. States that when making a fist the pain goes down her arm.  Patient states when she was seen in [place] an x-ray was done that showed swelling but no breaks.  Pain has gotten worse.

## 2021-12-06 ENCOUNTER — Ambulatory Visit (INDEPENDENT_AMBULATORY_CARE_PROVIDER_SITE_OTHER): Payer: Medicaid Other | Admitting: Orthopedic Surgery

## 2021-12-06 DIAGNOSIS — M654 Radial styloid tenosynovitis [de Quervain]: Secondary | ICD-10-CM

## 2021-12-06 HISTORY — DX: Radial styloid tenosynovitis (de quervain): M65.4

## 2021-12-06 MED ORDER — BETAMETHASONE SOD PHOS & ACET 6 (3-3) MG/ML IJ SUSP
6.0000 mg | INTRAMUSCULAR | Status: AC | PRN
  Administered 2021-12-06: 6 mg via INTRA_ARTICULAR

## 2021-12-06 MED ORDER — LIDOCAINE HCL 1 % IJ SOLN
1.0000 mL | INTRAMUSCULAR | Status: AC | PRN
Start: 2021-12-06 — End: 2021-12-06
  Administered 2021-12-06: 1 mL

## 2021-12-06 NOTE — Progress Notes (Signed)
Office Visit Note   Patient: Alexis Blanchard           Date of Birth: 1995/08/02           MRN: [id number] Visit Date: 12/06/2021              Requested by: Ellsworth Lennox, PA-C [address] [place],  [place] [address] PCP: Patient, No Pcp Per   Assessment & Plan: Visit Diagnoses:  1. De Quervain's tenosynovitis, right     Plan: Discussed with patient that her signs and symptoms do seem most consistent with de Quervain's tenosynovitis.  We reviewed the nature of this diagnosis as well as its prognosis, and both conservative and surgical treatment options.  Patient would like to proceed with a corticosteroid injection into the right first dorsal compartment.  I can see her back again as needed if this fails to resolve her symptoms.   Follow-Up Instructions: No follow-ups on file.   Orders:  No orders of the defined types were placed in this encounter.  No orders of the defined types were placed in this encounter.     Procedures: Hand/UE Inj: R extensor compartment 1 for de Quervain's tenosynovitis on 12/06/2021 11:04 AM Indications: tendon swelling and therapeutic Details: 25 G needle, radial approach Medications: 1 mL lidocaine 1 %; 6 mg betamethasone acetate-betamethasone sodium phosphate 6 (3-3) MG/ML Procedure, treatment alternatives, risks and benefits explained, specific risks discussed. Consent was given by the patient. Immediately prior to procedure a time out was called to verify the correct patient, procedure, equipment, support staff and site/side marked as required. Patient was prepped and draped in the usual sterile fashion.       Clinical Data: No additional findings.   Subjective: Chief Complaint  Patient presents with   Left Wrist - Pain    This is a 26 year old left-hand-dominant female presents with pain at the radial aspect of the right wrist for the last month or so.  Her pain is worse with activities that involve thumb range of motion.   Her pain can be as bad as 7/10 with certain activities.  Her pain has progressively worsened since it was first noticed.  She been wearing a thumb spica brace with very modest symptom relief.    Review of Systems   Objective: Vital Signs: LMP  (LMP Unknown)   Physical Exam Constitutional:      Appearance: Normal appearance.  Cardiovascular:     Rate and Rhythm: Normal rate.     Pulses: Normal pulses.  Pulmonary:     Effort: Pulmonary effort is normal.  Skin:    General: Skin is warm and dry.     Capillary Refill: Capillary refill takes less than 2 seconds.  Neurological:     Mental Status: She is alert.     Right Hand Exam   Tenderness  Right hand tenderness location: TTP over radial styloid.  Tests  Finkelstein's test: positive  Other  Erythema: absent Sensation: normal Pulse: present      Specialty Comments:  No specialty comments available.  Imaging: No results found.   PMFS History: Patient Active Problem List   Diagnosis Date Noted   De Quervain's tenosynovitis, right 12/06/2021   VBAC (vaginal birth after Cesarean) 11/24/2020   Gestational hypertension, third trimester 11/23/2020   Gestational hypertension 11/18/2020   COVID-19 affecting pregnancy in first trimester 06/16/2020   ASCUS with positive high risk HPV cervical on 05/20/2020 05/28/2020   History of severe pre-eclampsia 05/19/2020  History of cesarean delivery affecting pregnancy 05/19/2020   Supervision of high risk pregnancy, antepartum 05/19/2020   Group B streptococcal bacteriuria 05/06/2017   Alpha thalassemia silent carrier 01/31/2017   Past Medical History:  Diagnosis Date   Excessive fetal growth affecting management of mother in second trimester, antepartum 10/06/2020   [ ]  f/u rpt growth Normal 2h GTT  4/12: 92%, 684g, ac 64%, FL 94%, nl AFI   False positive RPR test 05/20/2020   Negative T.pallidium confirmatory test   Gestational diabetes    first preg   Severe  preeclampsia    Delivered at 34 weeks   Syphilis    UTI (urinary tract infection)     Family History  Problem Relation Age of Onset   Hypertension Mother    Heart disease Maternal Aunt        CHF   Cancer Maternal Aunt 45       colon   Cancer Maternal Aunt 45       throat   Congestive Heart Failure Maternal Uncle    Diabetes Maternal Grandmother    Hypertension Maternal Grandmother    Heart disease Maternal Grandmother     Past Surgical History:  Procedure Laterality Date   CESAREAN SECTION N/A 06/08/2017   Procedure: CESAREAN SECTION;  Surgeon: 06/10/2017, MD;  Location: [facility] BIRTHING SUITES;  Service: Obstetrics;  Laterality: N/A;   TONSILLECTOMY     Social History   Occupational History   Not on file  Tobacco Use   Smoking status: Never   Smokeless tobacco: Never  Vaping Use   Vaping Use: Never used  Substance and Sexual Activity   Alcohol use: Not Currently    Comment: pt states that she has not used since finding out about the pregnancy   Drug use: Not Currently    Types: Marijuana    Comment: pt states that she has not used since finding out about the pregnancy   Sexual activity: Yes    Birth control/protection: None

## 2021-12-07 DIAGNOSIS — Z419 Encounter for procedure for purposes other than remedying health state, unspecified: Secondary | ICD-10-CM | POA: Diagnosis not present

## 2022-01-03 ENCOUNTER — Ambulatory Visit: Payer: Medicaid Other | Admitting: Orthopedic Surgery

## 2022-01-07 DIAGNOSIS — Z419 Encounter for procedure for purposes other than remedying health state, unspecified: Secondary | ICD-10-CM | POA: Diagnosis not present

## 2022-01-15 DIAGNOSIS — J069 Acute upper respiratory infection, unspecified: Secondary | ICD-10-CM | POA: Diagnosis not present

## 2022-01-15 DIAGNOSIS — Z77120 Contact with and (suspected) exposure to mold (toxic): Secondary | ICD-10-CM | POA: Diagnosis not present

## 2022-02-05 DIAGNOSIS — B9689 Other specified bacterial agents as the cause of diseases classified elsewhere: Secondary | ICD-10-CM | POA: Diagnosis not present

## 2022-02-05 DIAGNOSIS — H1031 Unspecified acute conjunctivitis, right eye: Secondary | ICD-10-CM | POA: Diagnosis not present

## 2022-02-06 DIAGNOSIS — Z419 Encounter for procedure for purposes other than remedying health state, unspecified: Secondary | ICD-10-CM | POA: Diagnosis not present

## 2022-03-09 DIAGNOSIS — Z419 Encounter for procedure for purposes other than remedying health state, unspecified: Secondary | ICD-10-CM | POA: Diagnosis not present

## 2022-04-08 DIAGNOSIS — Z419 Encounter for procedure for purposes other than remedying health state, unspecified: Secondary | ICD-10-CM | POA: Diagnosis not present

## 2022-04-14 ENCOUNTER — Ambulatory Visit (HOSPITAL_COMMUNITY)
Admission: EM | Admit: 2022-04-14 | Discharge: 2022-04-14 | Disposition: A | Discharge status: Home / Self Care | Payer: Medicaid Other | Attending: Family Medicine | Admitting: Family Medicine

## 2022-04-14 ENCOUNTER — Encounter (HOSPITAL_COMMUNITY): Payer: Self-pay

## 2022-04-14 DIAGNOSIS — M654 Radial styloid tenosynovitis [de Quervain]: Secondary | ICD-10-CM

## 2022-04-14 DIAGNOSIS — R051 Acute cough: Secondary | ICD-10-CM

## 2022-04-14 DIAGNOSIS — J101 Influenza due to other identified influenza virus with other respiratory manifestations: Secondary | ICD-10-CM | POA: Diagnosis not present

## 2022-04-14 LAB — POC INFLUENZA A AND B ANTIGEN (URGENT CARE ONLY)
INFLUENZA A ANTIGEN, POC: POSITIVE — AB
INFLUENZA B ANTIGEN, POC: NEGATIVE

## 2022-04-14 MED ORDER — ACETAMINOPHEN 325 MG PO TABS
ORAL_TABLET | ORAL | Status: AC
  Filled 2022-04-14: qty 2

## 2022-04-14 MED ORDER — ACETAMINOPHEN 325 MG PO TABS
650.0000 mg | ORAL_TABLET | Freq: Once | ORAL | Status: AC
  Administered 2022-04-14: 650 mg via ORAL

## 2022-04-14 MED ORDER — PREDNISONE 20 MG PO TABS: 40.0000 mg | ORAL_TABLET | Freq: Every day | ORAL | 0 refills | Status: DC

## 2022-04-14 MED ORDER — OSELTAMIVIR PHOSPHATE 75 MG PO CAPS: 75.0000 mg | ORAL_CAPSULE | Freq: Two times a day (BID) | ORAL | 0 refills | Status: DC

## 2022-04-14 MED ORDER — IBUPROFEN 800 MG PO TABS: 800.0000 mg | ORAL_TABLET | Freq: Three times a day (TID) | ORAL | 0 refills | Status: DC

## 2022-04-14 MED ORDER — PROMETHAZINE-DM 6.25-15 MG/5ML PO SYRP: 5.0000 mL | ORAL_SOLUTION | Freq: Four times a day (QID) | ORAL | 0 refills | Status: DC | PRN

## 2022-04-14 NOTE — ED Provider Notes (Signed)
[facility]   [id number] 04/14/22 Arrival Time: 1026  ASSESSMENT & PLAN:  1. Influenza A   2. Acute cough   3. De Quervain's tenosynovitis, right    Discussed typical duration of likely viral illness. OTC symptom care as needed. Had discussed thumb spica but pt left before we could place. Hopefully ibup and prednisone will help in addition to influenza treatment.  Discharge Medication List as of 04/14/2022  2:16 PM     START taking these medications   Details  ibuprofen (ADVIL) 800 MG tablet Take 1 tablet (800 mg total) by mouth 3 (three) times daily with meals., Starting Thu 04/14/2022, Normal    oseltamivir (TAMIFLU) 75 MG capsule Take 1 capsule (75 mg total) by mouth every 12 (twelve) hours., Starting Thu 04/14/2022, Normal    promethazine-dextromethorphan (PROMETHAZINE-DM) 6.25-15 MG/5ML syrup Take 5 mLs by mouth 4 (four) times daily as needed for cough., Starting Thu 04/14/2022, Normal         Follow-up Information     [facility].   Specialty: Urgent Care Why: If worsening or failing to improve as anticipated. Contact information: [address] [place] [address] [phone]                Reviewed expectations re: course of current medical issues. Questions answered. Outlined signs and symptoms indicating need for more acute intervention. Understanding verbalized. After Visit Summary given.   SUBJECTIVE: History from: Patient. Alexis Blanchard is a 26 y.o. female. Reports: Pt c/o cough, headache, and dizziness since yesterday. Denies taking any meds. Children with cough, congestion; several days. Reports subj fever/chills today. Also pt c/o rt wrist pain for past few days. Denies know injury. States hx of chronic pain; same site. Denies: difficulty breathing. Normal PO intake without n/v/d.  OBJECTIVE:  Vitals:   04/14/22 1343  BP: (!) 143/98  Pulse: (!) 135  Resp: 16  Temp: (!) 102 F  (38.9 C)  TempSrc: Oral  SpO2: 98%    Abnormal vitals noted.  General appearance: alert; no distress but very fatigued-appearing Eyes: PERRLA; EOMI; conjunctiva normal HENT: [place]; AT; with nasal congestion Neck: supple  Lungs: speaks full sentences without difficulty; unlabored; CTAB; active cough Extremities: no edema; pain reported over RIGHT radial side of wrist, more notable with thumb and wrist movement; no erythema or inflammation; no swelling; FROM; very tender over radial styloid Skin: warm and dry Neurologic: normal gait Psychological: alert and cooperative; normal mood and affect  Labs: Results for orders placed or performed during the hospital encounter of 04/14/22  POC Influenza A & B Ag (Urgent Care)  Result Value Ref Range   INFLUENZA A ANTIGEN, POC POSITIVE (A) NEGATIVE   INFLUENZA B ANTIGEN, POC NEGATIVE NEGATIVE   Labs Reviewed  POC INFLUENZA A AND B ANTIGEN (URGENT CARE ONLY) - Abnormal; Notable for the following components:      Result Value   INFLUENZA A ANTIGEN, POC POSITIVE (*)    All other components within normal limits    No Known Allergies  Past Medical History:  Diagnosis Date   Excessive fetal growth affecting management of mother in second trimester, antepartum 10/06/2020   [ ]  f/u rpt growth Normal 2h GTT  4/12: 92%, 684g, ac 64%, FL 94%, nl AFI   False positive RPR test 05/20/2020   Negative T.pallidium confirmatory test   Gestational diabetes    first preg   Severe preeclampsia    Delivered at 34 weeks  Syphilis    UTI (urinary tract infection)    Social History   Socioeconomic History   Marital status: Single    Spouse name: Not on file   Number of children: Not on file   Years of education: Not on file   Highest education level: Not on file  Occupational History   Not on file  Tobacco Use   Smoking status: Never   Smokeless tobacco: Never  Vaping Use   Vaping Use: Never used  Substance and Sexual Activity   Alcohol use:  Not Currently    Comment: pt states that she has not used since finding out about the pregnancy   Drug use: Not Currently    Types: Marijuana    Comment: pt states that she has not used since finding out about the pregnancy   Sexual activity: Yes    Birth control/protection: Implant  Other Topics Concern   Not on file  Social History Narrative   Not on file   Social Determinants of Health   Financial Resource Strain: Not on file  Food Insecurity: Not on file  Transportation Needs: Not on file  Physical Activity: Not on file  Stress: Not on file  Social Connections: Not on file  Intimate Partner Violence: Not on file   Family History  Problem Relation Age of Onset   Hypertension Mother    Heart disease Maternal Aunt        CHF   Cancer Maternal Aunt 45       colon   Cancer Maternal Aunt 45       throat   Congestive Heart Failure Maternal Uncle    Diabetes Maternal Grandmother    Hypertension Maternal Grandmother    Heart disease Maternal Grandmother    Past Surgical History:  Procedure Laterality Date   CESAREAN SECTION N/A 06/08/2017   Procedure: CESAREAN SECTION;  Surgeon: Willodean Rosenthal, MD;  Location: [facility] BIRTHING SUITES;  Service: Obstetrics;  Laterality: N/A;   Greig Right, MD 04/14/22 [phone]

## 2022-04-14 NOTE — ED Triage Notes (Signed)
Pt c/o cough, headache, and dizziness since yesterday. Denies taking any meds.  Pt c/o rt wrist pain for past few days. Denies know injury. States hx of chronic pain.

## 2022-04-14 NOTE — Discharge Instructions (Signed)

## 2022-05-06 DIAGNOSIS — M25531 Pain in right wrist: Secondary | ICD-10-CM | POA: Diagnosis not present

## 2022-05-06 DIAGNOSIS — R0981 Nasal congestion: Secondary | ICD-10-CM | POA: Diagnosis not present

## 2022-05-06 DIAGNOSIS — R051 Acute cough: Secondary | ICD-10-CM | POA: Diagnosis not present

## 2022-05-09 DIAGNOSIS — Z419 Encounter for procedure for purposes other than remedying health state, unspecified: Secondary | ICD-10-CM | POA: Diagnosis not present

## 2022-06-09 DIAGNOSIS — Z419 Encounter for procedure for purposes other than remedying health state, unspecified: Secondary | ICD-10-CM | POA: Diagnosis not present

## 2022-07-07 DIAGNOSIS — Z3200 Encounter for pregnancy test, result unknown: Secondary | ICD-10-CM | POA: Diagnosis not present

## 2022-07-07 DIAGNOSIS — Z79899 Other long term (current) drug therapy: Secondary | ICD-10-CM | POA: Diagnosis not present

## 2022-07-07 DIAGNOSIS — R5383 Other fatigue: Secondary | ICD-10-CM | POA: Diagnosis not present

## 2022-07-07 DIAGNOSIS — M129 Arthropathy, unspecified: Secondary | ICD-10-CM | POA: Diagnosis not present

## 2022-07-07 DIAGNOSIS — M25532 Pain in left wrist: Secondary | ICD-10-CM | POA: Diagnosis not present

## 2022-07-07 DIAGNOSIS — L910 Hypertrophic scar: Secondary | ICD-10-CM | POA: Diagnosis not present

## 2022-07-07 DIAGNOSIS — M25531 Pain in right wrist: Secondary | ICD-10-CM | POA: Diagnosis not present

## 2022-07-08 DIAGNOSIS — Z419 Encounter for procedure for purposes other than remedying health state, unspecified: Secondary | ICD-10-CM | POA: Diagnosis not present

## 2022-07-14 DIAGNOSIS — M25532 Pain in left wrist: Secondary | ICD-10-CM | POA: Diagnosis not present

## 2022-07-14 DIAGNOSIS — L910 Hypertrophic scar: Secondary | ICD-10-CM | POA: Diagnosis not present

## 2022-07-14 DIAGNOSIS — M25531 Pain in right wrist: Secondary | ICD-10-CM | POA: Diagnosis not present

## 2022-07-15 DIAGNOSIS — M25531 Pain in right wrist: Secondary | ICD-10-CM | POA: Diagnosis not present

## 2022-08-08 DIAGNOSIS — Z419 Encounter for procedure for purposes other than remedying health state, unspecified: Secondary | ICD-10-CM | POA: Diagnosis not present

## 2022-08-15 DIAGNOSIS — M25531 Pain in right wrist: Secondary | ICD-10-CM | POA: Diagnosis not present

## 2022-09-07 DIAGNOSIS — Z419 Encounter for procedure for purposes other than remedying health state, unspecified: Secondary | ICD-10-CM | POA: Diagnosis not present

## 2022-09-10 DIAGNOSIS — R110 Nausea: Secondary | ICD-10-CM | POA: Diagnosis not present

## 2022-09-10 DIAGNOSIS — N939 Abnormal uterine and vaginal bleeding, unspecified: Secondary | ICD-10-CM | POA: Diagnosis not present

## 2022-09-10 DIAGNOSIS — R1011 Right upper quadrant pain: Secondary | ICD-10-CM | POA: Diagnosis not present

## 2022-09-10 DIAGNOSIS — N938 Other specified abnormal uterine and vaginal bleeding: Secondary | ICD-10-CM | POA: Diagnosis not present

## 2022-09-14 DIAGNOSIS — L910 Hypertrophic scar: Secondary | ICD-10-CM | POA: Diagnosis not present

## 2022-09-14 DIAGNOSIS — L601 Onycholysis: Secondary | ICD-10-CM | POA: Diagnosis not present

## 2022-10-03 DIAGNOSIS — R030 Elevated blood-pressure reading, without diagnosis of hypertension: Secondary | ICD-10-CM | POA: Diagnosis not present

## 2022-10-03 DIAGNOSIS — R509 Fever, unspecified: Secondary | ICD-10-CM | POA: Diagnosis not present

## 2022-10-03 DIAGNOSIS — J029 Acute pharyngitis, unspecified: Secondary | ICD-10-CM | POA: Diagnosis not present

## 2022-10-03 DIAGNOSIS — B349 Viral infection, unspecified: Secondary | ICD-10-CM | POA: Diagnosis not present

## 2022-10-03 DIAGNOSIS — R0989 Other specified symptoms and signs involving the circulatory and respiratory systems: Secondary | ICD-10-CM | POA: Diagnosis not present

## 2022-10-03 DIAGNOSIS — Z20822 Contact with and (suspected) exposure to covid-19: Secondary | ICD-10-CM | POA: Diagnosis not present

## 2022-10-04 DIAGNOSIS — Z2009 Contact with and (suspected) exposure to other intestinal infectious diseases: Secondary | ICD-10-CM | POA: Diagnosis not present

## 2022-10-04 DIAGNOSIS — Z831 Family history of other infectious and parasitic diseases: Secondary | ICD-10-CM | POA: Diagnosis not present

## 2022-10-08 DIAGNOSIS — Z419 Encounter for procedure for purposes other than remedying health state, unspecified: Secondary | ICD-10-CM | POA: Diagnosis not present

## 2022-10-19 DIAGNOSIS — L910 Hypertrophic scar: Secondary | ICD-10-CM | POA: Diagnosis not present

## 2022-11-07 DIAGNOSIS — Z419 Encounter for procedure for purposes other than remedying health state, unspecified: Secondary | ICD-10-CM | POA: Diagnosis not present

## 2022-12-07 DIAGNOSIS — L910 Hypertrophic scar: Secondary | ICD-10-CM | POA: Diagnosis not present

## 2022-12-08 DIAGNOSIS — Z419 Encounter for procedure for purposes other than remedying health state, unspecified: Secondary | ICD-10-CM | POA: Diagnosis not present

## 2023-01-08 DIAGNOSIS — Z419 Encounter for procedure for purposes other than remedying health state, unspecified: Secondary | ICD-10-CM | POA: Diagnosis not present

## 2023-01-16 DIAGNOSIS — R3915 Urgency of urination: Secondary | ICD-10-CM | POA: Diagnosis not present

## 2023-01-16 DIAGNOSIS — R350 Frequency of micturition: Secondary | ICD-10-CM | POA: Diagnosis not present

## 2023-01-16 DIAGNOSIS — N898 Other specified noninflammatory disorders of vagina: Secondary | ICD-10-CM | POA: Diagnosis not present

## 2023-01-31 DIAGNOSIS — R519 Headache, unspecified: Secondary | ICD-10-CM | POA: Diagnosis not present

## 2023-01-31 DIAGNOSIS — Z20822 Contact with and (suspected) exposure to covid-19: Secondary | ICD-10-CM | POA: Diagnosis not present

## 2023-01-31 DIAGNOSIS — R110 Nausea: Secondary | ICD-10-CM | POA: Diagnosis not present

## 2023-02-01 DIAGNOSIS — L910 Hypertrophic scar: Secondary | ICD-10-CM | POA: Diagnosis not present

## 2023-02-07 DIAGNOSIS — Z419 Encounter for procedure for purposes other than remedying health state, unspecified: Secondary | ICD-10-CM | POA: Diagnosis not present

## 2023-03-02 DIAGNOSIS — M67432 Ganglion, left wrist: Secondary | ICD-10-CM | POA: Diagnosis not present

## 2023-03-02 DIAGNOSIS — L209 Atopic dermatitis, unspecified: Secondary | ICD-10-CM | POA: Diagnosis not present

## 2023-03-10 DIAGNOSIS — Z419 Encounter for procedure for purposes other than remedying health state, unspecified: Secondary | ICD-10-CM | POA: Diagnosis not present

## 2023-03-16 DIAGNOSIS — M654 Radial styloid tenosynovitis [de Quervain]: Secondary | ICD-10-CM | POA: Diagnosis not present

## 2023-03-16 DIAGNOSIS — M25532 Pain in left wrist: Secondary | ICD-10-CM | POA: Diagnosis not present

## 2023-03-16 DIAGNOSIS — M6740 Ganglion, unspecified site: Secondary | ICD-10-CM | POA: Diagnosis not present

## 2023-03-16 DIAGNOSIS — G8929 Other chronic pain: Secondary | ICD-10-CM | POA: Diagnosis not present

## 2023-04-09 DIAGNOSIS — Z419 Encounter for procedure for purposes other than remedying health state, unspecified: Secondary | ICD-10-CM | POA: Diagnosis not present

## 2023-05-10 DIAGNOSIS — Z419 Encounter for procedure for purposes other than remedying health state, unspecified: Secondary | ICD-10-CM | POA: Diagnosis not present

## 2023-06-04 DIAGNOSIS — R3915 Urgency of urination: Secondary | ICD-10-CM | POA: Diagnosis not present

## 2023-06-04 DIAGNOSIS — N3001 Acute cystitis with hematuria: Secondary | ICD-10-CM | POA: Diagnosis not present

## 2023-06-10 DIAGNOSIS — Z419 Encounter for procedure for purposes other than remedying health state, unspecified: Secondary | ICD-10-CM | POA: Diagnosis not present

## 2023-06-13 DIAGNOSIS — N760 Acute vaginitis: Secondary | ICD-10-CM | POA: Diagnosis not present

## 2023-06-13 DIAGNOSIS — B9789 Other viral agents as the cause of diseases classified elsewhere: Secondary | ICD-10-CM | POA: Diagnosis not present

## 2023-06-13 DIAGNOSIS — R102 Pelvic and perineal pain: Secondary | ICD-10-CM | POA: Diagnosis not present

## 2023-06-13 DIAGNOSIS — Z7251 High risk heterosexual behavior: Secondary | ICD-10-CM | POA: Diagnosis not present

## 2023-07-08 DIAGNOSIS — Z419 Encounter for procedure for purposes other than remedying health state, unspecified: Secondary | ICD-10-CM | POA: Diagnosis not present

## 2023-07-17 DIAGNOSIS — M6740 Ganglion, unspecified site: Secondary | ICD-10-CM | POA: Diagnosis not present

## 2023-07-27 DIAGNOSIS — M67432 Ganglion, left wrist: Secondary | ICD-10-CM | POA: Diagnosis not present

## 2023-07-27 DIAGNOSIS — M6740 Ganglion, unspecified site: Secondary | ICD-10-CM | POA: Diagnosis not present

## 2023-08-19 DIAGNOSIS — Z419 Encounter for procedure for purposes other than remedying health state, unspecified: Secondary | ICD-10-CM | POA: Diagnosis not present

## 2023-09-07 DIAGNOSIS — M25532 Pain in left wrist: Secondary | ICD-10-CM | POA: Diagnosis not present

## 2023-09-07 DIAGNOSIS — R29898 Other symptoms and signs involving the musculoskeletal system: Secondary | ICD-10-CM | POA: Diagnosis not present

## 2023-09-07 DIAGNOSIS — M25632 Stiffness of left wrist, not elsewhere classified: Secondary | ICD-10-CM | POA: Diagnosis not present

## 2023-09-18 DIAGNOSIS — Z419 Encounter for procedure for purposes other than remedying health state, unspecified: Secondary | ICD-10-CM | POA: Diagnosis not present

## 2023-10-05 ENCOUNTER — Ambulatory Visit: Admitting: Obstetrics and Gynecology

## 2023-10-05 ENCOUNTER — Encounter: Payer: Self-pay | Admitting: Obstetrics and Gynecology

## 2023-10-05 VITALS — BP 133/94 | HR 91 | Wt 174.0 lb

## 2023-10-05 DIAGNOSIS — Z3046 Encounter for surveillance of implantable subdermal contraceptive: Secondary | ICD-10-CM | POA: Diagnosis not present

## 2023-10-05 DIAGNOSIS — R87610 Atypical squamous cells of undetermined significance on cytologic smear of cervix (ASC-US): Secondary | ICD-10-CM

## 2023-10-05 HISTORY — PX: REMOVAL OF IMPLANON ROD: OBO 1006

## 2023-10-05 NOTE — Procedures (Signed)
 Nexplanon  Removal Procedure Note Placed July 2022 and patient desires removal as she believes it's causing issues with weight loss. Patient unsure about future, immediate birth control options.   After informed consent was obtained, the patient's right arm was examined and the Nexplanon  rod was noted to be easily palpable. The area was cleaned with alcohol then local anesthesia was infiltrated with 3 ml of 1% lidocaine  with epinephrine . The area was prepped with betadine. Using sterile technique, an 11 blade was used to make an incision, and the Nexplanon  device was brought to the incision site. The capsule was scrapped off with the scalpel, the Nexplanon  grasped with hemostats, and easily removed; the removal site was hemostatic. The Nexplanon  was inspected and noted to be intact.  A steri-strip and a pressure dressing was applied.  The patient tolerated the procedure well.   Tyler Gallant MD Attending Center for [employer] [facility])

## 2023-10-05 NOTE — Progress Notes (Signed)
 Patient here fro Nexplanon  Removal. *Right Arm   Pt states she does not want any contraception at this time notes upcoming wedding and feels nexplanon  is not helping with weight loss .  Last pap: 05/19/2020 +HPV ASC-US 

## 2023-10-19 DIAGNOSIS — Z419 Encounter for procedure for purposes other than remedying health state, unspecified: Secondary | ICD-10-CM | POA: Diagnosis not present

## 2023-11-08 ENCOUNTER — Ambulatory Visit: Admitting: Advanced Practice Midwife

## 2023-11-08 ENCOUNTER — Other Ambulatory Visit (HOSPITAL_COMMUNITY)
Admission: RE | Admit: 2023-11-08 | Discharge: 2023-11-08 | Disposition: A | Discharge status: Home / Self Care | Source: Ambulatory Visit | Attending: Certified Nurse Midwife | Admitting: Certified Nurse Midwife

## 2023-11-08 ENCOUNTER — Encounter: Payer: Self-pay | Admitting: Advanced Practice Midwife

## 2023-11-08 VITALS — BP 130/83 | HR 102 | Wt 171.0 lb

## 2023-11-08 DIAGNOSIS — R87810 Cervical high risk human papillomavirus (HPV) DNA test positive: Secondary | ICD-10-CM | POA: Diagnosis not present

## 2023-11-08 DIAGNOSIS — Z124 Encounter for screening for malignant neoplasm of cervix: Secondary | ICD-10-CM | POA: Insufficient documentation

## 2023-11-08 DIAGNOSIS — Z113 Encounter for screening for infections with a predominantly sexual mode of transmission: Secondary | ICD-10-CM | POA: Diagnosis not present

## 2023-11-08 DIAGNOSIS — R87610 Atypical squamous cells of undetermined significance on cytologic smear of cervix (ASC-US): Secondary | ICD-10-CM | POA: Diagnosis not present

## 2023-11-08 DIAGNOSIS — Z1151 Encounter for screening for human papillomavirus (HPV): Secondary | ICD-10-CM | POA: Diagnosis not present

## 2023-11-08 DIAGNOSIS — Z01419 Encounter for gynecological examination (general) (routine) without abnormal findings: Secondary | ICD-10-CM

## 2023-11-08 NOTE — Patient Instructions (Addendum)
 VISIT SUMMARY: Today, you came in for your annual exam and to follow after Nexplanon  removal. We discussed your recent weight gain and lack of menstruation since receiving the Nexplanon  implant. You also requested comprehensive testing for sexually transmitted infections. Additionally, we reviewed your family planning options and general health maintenance.  YOUR PLAN: -ANNUAL GYNECOLOGICAL EXAM: We talked about the potential for menstrual irregularities after Nexplanon  removal and advised you to take a home pregnancy test if your period does not resume. We also explained the importance of a Pap smear for cervical cancer and HPV screening. We will perform a Pap smear and test for gonorrhea, chlamydia, and trichomoniasis. Additionally, we will conduct blood work for HIV, hepatitis B, hepatitis C, and syphilis.  -FAMILY PLANNING: We discussed the effectiveness of Nexplanon  and other contraceptive methods compared to oral contraceptives and condoms. We reviewed your family planning options and advised you to contact our office if you decide to start any birth control method.  -GENERAL HEALTH MAINTENANCE: We talked about the importance of regular health screenings and the role of primary care providers in your overall health. We recommend establishing care with a primary care provider for comprehensive health maintenance, including screenings for diabetes, hypertension, and vaccinations. We provided information on how to find a primary care provider through [facility] Get Care Now service.  INSTRUCTIONS: Please take a home pregnancy test if your menstruation does not resume. Establish care with a primary care provider for comprehensive health maintenance. Schedule your next annual well-woman gynecology appointment in one year.                           Contains text generated by [employer].

## 2023-11-08 NOTE — Progress Notes (Signed)
 GYNECOLOGY OFFICE VISIT NOTE  History:  Discussed the use of AI scribe software for clinical note transcription with the patient, who gave verbal consent to proceed.  History of Present Illness Alexis Blanchard is a 28 year old female who presents for an annual exam and follow-up on Nexplanon  removal.  She had Nexplanon  removed  over a month ago due to weight gain since. She has not had her period since the implant was placed. There has been no sexual intercourse since removing the Nexplanon  implant, making pregnancy unlikely.  She has a history of abnormal Pap smears + HR HPV detection and believes she received the HPV vaccine, Gardasil, as a teenager.   She requests comprehensive testing for sexually transmitted infections, including gonorrhea, chlamydia, trichomoniasis, HIV, hepatitis B, and hepatitis C.   She denies any abnormal vaginal discharge, bleeding, pelvic pain or other concerns.    Past Medical History:  Diagnosis Date   Alpha thalassemia silent carrier 01/31/2017   COVID-19 affecting pregnancy in first trimester 06/16/2020   COVID positive on 06/03/20. unvaccinated           Excessive fetal growth affecting management of mother in second trimester, antepartum 10/06/2020   [ ]  f/u rpt growth Normal 2h GTT  4/12: 92%, 684g, ac 64%, FL 94%, nl AFI   False positive RPR test 05/20/2020   Negative T.pallidium confirmatory test   Gestational diabetes    first preg   Severe preeclampsia    Delivered at 34 weeks   Syphilis    UTI (urinary tract infection)     Past Surgical History:  Procedure Laterality Date   CESAREAN SECTION N/A 06/08/2017   Procedure: CESAREAN SECTION;  Surgeon: Corene Coy, MD;  Location: [facility] BIRTHING SUITES;  Service: Obstetrics;  Laterality: N/A;   REMOVAL OF IMPLANON  ROD  10/05/2023   TONSILLECTOMY      The following portions of the patient's history were reviewed and updated as appropriate: allergies, current medications, past family  history, past medical history, past social history, past surgical history and problem list.   Review of Systems:  Pertinent items noted in HPI and remainder of comprehensive ROS otherwise negative.  Physical Exam:  BP 130/83   Pulse (!) 102   Wt 171 lb (77.6 kg)   BMI 31.28 kg/m  Physical Exam VITALS: BP- 130/80 GENERAL: Alert, cooperative, well developed, no acute distress. HEENT: Normocephalic, normal oropharynx, moist mucous membranes. CHEST: Clear to auscultation bilaterally, no wheezes, rhonchi, or crackles. BREAST: Declined CARDIOVASCULAR: Normal heart rate and rhythm, S1 and S2 normal without murmurs. ABDOMEN: Soft, non-tender, non-distended, without organomegaly, normal bowel sounds. GENITOURINARY: Cervix pink and smooth, no sores. No abnormal vaginal discharge. Uterus normal size. Ovaries normal. EXTREMITIES: No cyanosis or edema. NEUROLOGICAL: Cranial nerves grossly intact, moves all extremities without gross motor or sensory deficit.  Labs and Imaging No results found for this or any previous visit (from the past week). No results found.    Assessment and Plan:     Assessment and Plan Assessment & Plan Annual Gynecological Exam Discussed potential menstrual irregularities post-Nexplanon  removal. Advised home pregnancy test if menstruation does not resume. Explained Pap smear for cervical cancer and HPV screening. - Perform Pap smear for cervical cancer and HPV screening. - Test for gonorrhea, chlamydia, and trichomoniasis. - Conduct blood work for HIV, hepatitis B, hepatitis C, and syphilis.  Family Planning Discussed Nexplanon  and other contraceptive methods. Explained effectiveness of long-term contraceptives compared to oral contraceptives and condoms. - Discuss family planning  options. - Advise contacting office if she decides to initiate any birth control method.  General Health Maintenance Explained importance of regular screenings and role of primary care  providers. Discussed comprehensive care including screenings for diabetes, hypertension, and vaccinations. - Recommend establishing care with a primary care provider for comprehensive health maintenance. - Provide information on finding a primary care provider through [facility] Get Care Now service. - Schedule annual well-woman gynecology appointment in one year.  1. Screening examination for STI (Primary) - Cytology - PAP - RPR - HIV antibody (with reflex) - Hepatitis B Surface AntiGEN - Hepatitis C antibody  2. Cervical cancer screening - Cytology - PAP  3. Well woman exam - Cytology - PAP  4. ASCUS with positive high risk HPV cervical on 05/20/2020   Routine preventative health maintenance measures emphasized. Please refer to After Visit Summary for other counseling recommendations.   Return in about 1 year (around 11/07/2024) for Annual Gynecology appointment.    I spent 20 minutes dedicated to the care of this patient including pre-visit review of records, face to face time with the patient discussing her conditions and treatments, post visit ordering of medications and appropriate tests or procedures, coordinating care and documenting this visit encounter.    Maddock Finigan  Claudene HOWARD 11/08/2023 12:46 PM Center for [employer] [facility], [facility]

## 2023-11-08 NOTE — Progress Notes (Signed)
 CC: pt here for Pap   Nexplanon  removed 10/05/23

## 2023-11-09 LAB — RPR: RPR Ser Ql: NONREACTIVE

## 2023-11-09 LAB — HEPATITIS B SURFACE ANTIGEN: Hepatitis B Surface Ag: NEGATIVE

## 2023-11-09 LAB — HEPATITIS C ANTIBODY: Hep C Virus Ab: NONREACTIVE

## 2023-11-09 LAB — HIV ANTIBODY (ROUTINE TESTING W REFLEX): HIV Screen 4th Generation wRfx: NONREACTIVE

## 2023-11-14 LAB — CYTOLOGY - PAP
Chlamydia: NEGATIVE
Comment: NEGATIVE
Comment: NEGATIVE
Comment: NEGATIVE
Comment: NORMAL
Diagnosis: NEGATIVE
High risk HPV: NEGATIVE
Neisseria Gonorrhea: NEGATIVE
Trichomonas: NEGATIVE

## 2023-11-15 ENCOUNTER — Ambulatory Visit: Payer: Self-pay | Admitting: Advanced Practice Midwife

## 2023-11-18 DIAGNOSIS — Z419 Encounter for procedure for purposes other than remedying health state, unspecified: Secondary | ICD-10-CM | POA: Diagnosis not present

## 2023-12-19 DIAGNOSIS — Z419 Encounter for procedure for purposes other than remedying health state, unspecified: Secondary | ICD-10-CM | POA: Diagnosis not present

## 2023-12-20 DIAGNOSIS — R079 Chest pain, unspecified: Secondary | ICD-10-CM | POA: Diagnosis not present

## 2023-12-20 DIAGNOSIS — R1013 Epigastric pain: Secondary | ICD-10-CM | POA: Diagnosis not present

## 2023-12-20 DIAGNOSIS — R112 Nausea with vomiting, unspecified: Secondary | ICD-10-CM | POA: Diagnosis not present

## 2024-01-14 DIAGNOSIS — M545 Low back pain, unspecified: Secondary | ICD-10-CM | POA: Diagnosis not present

## 2024-01-14 DIAGNOSIS — R110 Nausea: Secondary | ICD-10-CM | POA: Diagnosis not present

## 2024-01-14 DIAGNOSIS — Z3201 Encounter for pregnancy test, result positive: Secondary | ICD-10-CM | POA: Diagnosis not present

## 2024-01-19 DIAGNOSIS — Z419 Encounter for procedure for purposes other than remedying health state, unspecified: Secondary | ICD-10-CM | POA: Diagnosis not present

## 2024-01-20 DIAGNOSIS — R1031 Right lower quadrant pain: Secondary | ICD-10-CM | POA: Diagnosis not present

## 2024-01-20 DIAGNOSIS — O26891 Other specified pregnancy related conditions, first trimester: Secondary | ICD-10-CM | POA: Diagnosis not present

## 2024-01-20 DIAGNOSIS — R1084 Generalized abdominal pain: Secondary | ICD-10-CM | POA: Diagnosis not present

## 2024-01-20 DIAGNOSIS — O99891 Other specified diseases and conditions complicating pregnancy: Secondary | ICD-10-CM | POA: Diagnosis not present

## 2024-01-20 DIAGNOSIS — Z3A01 Less than 8 weeks gestation of pregnancy: Secondary | ICD-10-CM | POA: Diagnosis not present

## 2024-01-30 ENCOUNTER — Other Ambulatory Visit: Payer: Self-pay | Admitting: *Deleted

## 2024-01-30 MED ORDER — PROMETHAZINE HCL 25 MG PO TABS: 25.0000 mg | ORAL_TABLET | Freq: Four times a day (QID) | ORAL | 2 refills | Status: AC | PRN

## 2024-02-08 NOTE — Progress Notes (Signed)
 02/08/2024  [facility]   Patient ID:  Alexis Blanchard is a 28 y.o. (DOB December 17, 1995) female.  Assessment and Plan  [name] was seen today for establish care, hospital discharge and pregnancy.  Diagnoses and all orders for this visit:  Annual wellness visit Comments: -Encouraged vaccines as recommended by OB/GYN -Emphasized importance of proper nutrition/hydration and physical activity -Routine dental visits recommended   Assessment & Plan 1. Pregnancy: - She found out she was pregnant a couple of weeks ago and has been experiencing nausea and vomiting. - Physical exam findings include normal heart and bowel sounds. Lab work from the emergency room visit 3 weeks ago, including CBC and CMP, was normal. - She has an appointment with her OB/GYN on 02/21/2024 for her first prenatal visit. She is advised to stay hydrated, drink plenty of water, and consume small, frequent meals to help manage her symptoms. No additional lab work will be ordered at this time to avoid duplicate tests. - She is taking Day One prenatal vitamins but has not yet received her prescription from her OB/GYN.  2. Health maintenance: - She is advised to get her influenza vaccine, either today or during her upcoming GYN visit. A cholesterol level test will be added to her next blood work.  Follow-up: 02/21/2024 with OB/GYN.  Follow up in about 1 year (around 02/07/2025) for Annual Physical.   Health Maintenance Due  Topic Date Due  . Pap Smear  Never done  . HPV Vaccine (1 - 3-dose series) Never done  . Influenza Vaccine (1) 01/08/2024     Risks, benefits, and alternatives of the medications and treatment plan prescribed today were discussed, and patient expressed understanding. Plan follow-up as discussed or as needed if any worsening symptoms or change in condition.    A yearly preventative health exam was recommended and current age based recommendations were discussed.   Subjective    Patient ID:  Alexis Blanchard is a 28 y.o. (DOB 1996-04-14) female    Patient presents with  . Establish Care  . Hospital Discharge    Abdominal pain while pregnant  . Pregnancy    Has first OB appt. On 02/21/24     History of Present Illness The patient is a female who presents to establish care.  She discovered her pregnancy a few weeks ago, prior to an emergency room visit due to side and back pain. The ER visit revealed no abnormalities, but she was advised to seek primary care. Since then, she has not experienced any pain. She is currently taking Day One prenatal vitamins but has not yet received her prescription from her OB/GYN. She has an upcoming appointment with her GYN on 02/21/2024.  She has had three pregnancies, with one being premature. She had a C-section in 2019. She has not experienced any complications with her previous pregnancies, except for high blood pressure during one of them. She reports no current health concerns, attributing her nausea to her pregnancy. She experiences frequent vomiting and attempts to stay hydrated. She reports no bowel irregularities. She is sexually active, was previously using the Nexplanon  for birth control.  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   She does not exercise regularly or see a dentist. She reports no vision problems. Depression screening negative.  Last pap 11/08/2023 normal Not yet a candidate for mammogram, denies family history of breast cancer  Marital Status: Engaged Alcohol: Not currently due to pregnancy, prior rare use Tobacco: Does not smoke cigarettes but  is exposed to smoke Recreational Drugs: No Sexual Practices: Sexually active  PAST SURGICAL HISTORY: C-section in 2019  FAMILY HISTORY Her mother has high blood pressure and depression. Her father has asthma and high cholesterol. One of her siblings has depression.   Current Outpatient Medications  Medication Instructions  . tobramycin (TOBREX) 0.3%  ophthalmic solution 1 drop, Right Eye, Every 4 hours   Patient Care Team: Michelle Zavaleta Moreno, FNP as PCP - General (Family Medicine) Social History   Tobacco Use  . Smoking status: Never    Passive exposure: Current  . Smokeless tobacco: Never  Substance Use Topics  . Alcohol use: Not Currently    Comment: Rarely    Reviewed and updated this visit by provider: Tobacco  Allergies  Meds  Problems  Med Hx  Surg Hx  Fam Hx      Review of Systems is complete and negative except as noted.  Objective   Vitals:   02/08/24 0913  BP: 126/80  Patient Position: Sitting  Pulse: 90  Temp: 97.1 F (36.2 C)  TempSrc: Temporal  Resp: 18  Height: 5' 2 (1.575 m)  Weight: 177 lb (80.3 kg)  SpO2: 99%  BMI (Calculated): 32.4   Wt Readings from Last 3 Encounters:  02/08/24 177 lb (80.3 kg)  01/20/24 175 lb (79.4 kg)  09/10/22 145 lb (65.8 kg)    Physical Exam  Constitutional: Well-developed and well-nourished.  Sitting comfortably conversing normally.  Eyes: Conjunctivae, lids, and EOM are normal. Pupils are equal, round, and reactive to light. Lymphatics: There is no anterior or posterior cervical adenopathy. Neck: Supple with normal range of motion. No thyroid  mass and no thyromegaly present. Cardiovascular: Regular rate and rhythm with normal heart sounds and no edema present. There are no carotid bruits. Respiratory: Normal effort and clear to auscultation without wheezing or prolonged expiration. Gastrointestinal: Soft and nontender to palpation. Bowel sounds are normal. Musculoskeletal: Gait is normal. Upper and Lower extremities are symmetrical with grossly normal muscle strength and tone with normal range of motion.  Skin: Skin is warm and dry. No rashes or bruising noted.  Psychiatric: Behavior is Cooperative and Polite. Mood euthymyic. Affect is appropriate.  Computer technology was used to create visit note. Consent from the patient/caregiver was obtained  prior to its use.  Rosaline JENEANE Maid, FNP

## 2024-02-13 ENCOUNTER — Ambulatory Visit: Admitting: *Deleted

## 2024-02-13 ENCOUNTER — Other Ambulatory Visit (INDEPENDENT_AMBULATORY_CARE_PROVIDER_SITE_OTHER): Payer: Self-pay

## 2024-02-13 VITALS — BP 143/86 | HR 94 | Wt 179.0 lb

## 2024-02-13 DIAGNOSIS — O3680X0 Pregnancy with inconclusive fetal viability, not applicable or unspecified: Secondary | ICD-10-CM

## 2024-02-13 DIAGNOSIS — O34219 Maternal care for unspecified type scar from previous cesarean delivery: Secondary | ICD-10-CM

## 2024-02-13 DIAGNOSIS — Z3481 Encounter for supervision of other normal pregnancy, first trimester: Secondary | ICD-10-CM | POA: Diagnosis not present

## 2024-02-13 DIAGNOSIS — Z3A08 8 weeks gestation of pregnancy: Secondary | ICD-10-CM | POA: Diagnosis not present

## 2024-02-13 DIAGNOSIS — O0990 Supervision of high risk pregnancy, unspecified, unspecified trimester: Secondary | ICD-10-CM | POA: Insufficient documentation

## 2024-02-13 NOTE — Progress Notes (Signed)
 New OB Intake  I explained I am completing New OB Intake today. We discussed EDD of 09/24/2024, by Ultrasound. Pt is [id number]. I reviewed her allergies, medications and Medical/Surgical/OB history.    Patient Active Problem List   Diagnosis Date Noted   Supervision of high risk pregnancy, antepartum 02/13/2024   VBAC (vaginal birth after Cesarean) 11/24/2020   ASCUS with positive high risk HPV cervical on 05/20/2020 05/28/2020   History of severe pre-eclampsia 05/19/2020   History of cesarean delivery affecting pregnancy 05/19/2020   Alpha thalassemia silent carrier 01/31/2017    Concerns addressed today  Patient informed that the ultrasound is considered a limited obstetric ultrasound and is not intended to be a complete ultrasound exam.  Patient also informed that the ultrasound is not being completed with the intent of assessing for fetal or placental anomalies or any pelvic abnormalities. Explained that the purpose of today's ultrasound is to assess for viability.  Patient acknowledges the purpose of the exam and the limitations of the study.     Delivery Plans Plans to deliver at [facility]. Discussed the nature of our practice with multiple providers including residents and students. Due to the size of the practice, the delivering provider may not be the same as those providing prenatal care.   MyChart/Babyscripts MyChart access verified. I explained pt will have some visits in office and some virtually. Babyscripts app discussed and ordered.   Blood Pressure Cuff Blood pressure cuff discussed and given.Discussed to be used for virtual visits and or if needed BP checks weekly.  Anatomy US  Explained first scheduled US  will be around 19 weeks.   Last Pap Diagnosis  Date Value Ref Range Status  11/08/2023   Final   - Negative for intraepithelial lesion or malignancy (NILM)    First visit review I reviewed new OB appt with patient. Explained pt will be seen by Dr Izell at first  visit. Discussed Jenn[place] genetic screening with patient will get panorama at [facility]. Routine prenatal labs to be collected at [facility].    Alexis Buckles, RN 02/13/2024  10:56 AM

## 2024-02-15 ENCOUNTER — Inpatient Hospital Stay (HOSPITAL_COMMUNITY)
Admission: AD | Admit: 2024-02-15 | Discharge: 2024-02-15 | Disposition: A | Discharge status: Home / Self Care | Attending: Obstetrics & Gynecology | Admitting: Obstetrics & Gynecology

## 2024-02-15 ENCOUNTER — Other Ambulatory Visit: Payer: Self-pay

## 2024-02-15 DIAGNOSIS — O10911 Unspecified pre-existing hypertension complicating pregnancy, first trimester: Secondary | ICD-10-CM

## 2024-02-15 DIAGNOSIS — Z8249 Family history of ischemic heart disease and other diseases of the circulatory system: Secondary | ICD-10-CM | POA: Insufficient documentation

## 2024-02-15 DIAGNOSIS — O26891 Other specified pregnancy related conditions, first trimester: Secondary | ICD-10-CM | POA: Insufficient documentation

## 2024-02-15 DIAGNOSIS — O219 Vomiting of pregnancy, unspecified: Secondary | ICD-10-CM | POA: Diagnosis not present

## 2024-02-15 DIAGNOSIS — O10919 Unspecified pre-existing hypertension complicating pregnancy, unspecified trimester: Secondary | ICD-10-CM

## 2024-02-15 DIAGNOSIS — Z8616 Personal history of COVID-19: Secondary | ICD-10-CM | POA: Diagnosis not present

## 2024-02-15 DIAGNOSIS — Z3A08 8 weeks gestation of pregnancy: Secondary | ICD-10-CM

## 2024-02-15 DIAGNOSIS — R109 Unspecified abdominal pain: Secondary | ICD-10-CM | POA: Diagnosis not present

## 2024-02-15 DIAGNOSIS — R030 Elevated blood-pressure reading, without diagnosis of hypertension: Secondary | ICD-10-CM | POA: Diagnosis not present

## 2024-02-15 LAB — URINALYSIS, ROUTINE W REFLEX MICROSCOPIC
Bilirubin Urine: NEGATIVE
Glucose, UA: 150 mg/dL — AB
Hgb urine dipstick: NEGATIVE
Ketones, ur: NEGATIVE mg/dL
Leukocytes,Ua: NEGATIVE
Nitrite: NEGATIVE
Protein, ur: NEGATIVE mg/dL
Specific Gravity, Urine: 1.025 (ref 1.005–1.030)
pH: 6 (ref 5.0–8.0)

## 2024-02-15 LAB — CBC
HCT: 40.9 % (ref 36.0–46.0)
Hemoglobin: 13.5 g/dL (ref 12.0–15.0)
MCH: 26.7 pg (ref 26.0–34.0)
MCHC: 33 g/dL (ref 30.0–36.0)
MCV: 80.8 fL (ref 80.0–100.0)
Platelets: 381 K/uL (ref 150–400)
RBC: 5.06 MIL/uL (ref 3.87–5.11)
RDW: 14.4 % (ref 11.5–15.5)
WBC: 8.8 K/uL (ref 4.0–10.5)
nRBC: 0 % (ref 0.0–0.2)

## 2024-02-15 MED ORDER — ONDANSETRON 8 MG PO TBDP: 8.0000 mg | ORAL_TABLET | Freq: Three times a day (TID) | ORAL | 2 refills | Status: AC | PRN

## 2024-02-15 MED ORDER — ONDANSETRON 4 MG PO TBDP
8.0000 mg | ORAL_TABLET | Freq: Once | ORAL | Status: AC
  Administered 2024-02-15: 8 mg via ORAL
  Filled 2024-02-15: qty 2

## 2024-02-15 MED ORDER — ACETAMINOPHEN 500 MG PO TABS
1000.0000 mg | ORAL_TABLET | Freq: Once | ORAL | Status: AC
  Administered 2024-02-15: 1000 mg via ORAL
  Filled 2024-02-15: qty 2

## 2024-02-15 NOTE — MAU Provider Note (Signed)
 History     CSN: [id number]  Arrival date and time: 02/15/24 1715   Event Date/Time   First Provider Initiated Contact with Patient 02/15/24 1859      Chief Complaint  Patient presents with   Abdominal Pain   HPI  Alexis Blanchard is a 28 y.o. [id number] at [redacted]w[redacted]d who presents for evaluation of right sided abdominal pain. Patient reports she got very worked up this morning and started having pain on her right side. When it happened it was a 5/10 and she did not take anything for the pain. She denies any vaginal bleeding, discharge, and leaking of fluid. Denies any constipation, diarrhea or any urinary complaints.   She has known CHTN, no meds. Denies any HA, visual changes or epigastric pain.   OB History     Gravida  5   Para  2   Term  1   Preterm  1   AB  2   Living  2      SAB  2   IAB  0   Ectopic  0   Multiple  0   Live Births  2           Past Medical History:  Diagnosis Date   Alpha thalassemia silent carrier 01/31/2017   COVID-19 affecting pregnancy in first trimester 06/16/2020   COVID positive on 06/03/20. unvaccinated           De Quervain's tenosynovitis, right 12/06/2021   Excessive fetal growth affecting management of mother in second trimester, antepartum 10/06/2020   [ ]  f/u rpt growth Normal 2h GTT  4/12: 92%, 684g, ac 64%, FL 94%, nl AFI   False positive RPR test 05/20/2020   Negative T.pallidium confirmatory test   Gestational diabetes    first preg   Severe preeclampsia    Delivered at 34 weeks   Syphilis    UTI (urinary tract infection)     Past Surgical History:  Procedure Laterality Date   CESAREAN SECTION N/A 06/08/2017   Procedure: CESAREAN SECTION;  Surgeon: Corene Coy, MD;  Location: [facility] BIRTHING SUITES;  Service: Obstetrics;  Laterality: N/A;   REMOVAL OF IMPLANON  ROD  10/05/2023   TONSILLECTOMY      Family History  Problem Relation Age of Onset   Hypertension Mother    Heart disease Maternal Aunt         CHF   Cancer Maternal Aunt 45       colon   Cancer Maternal Aunt 45       throat   Congestive Heart Failure Maternal Uncle    Diabetes Maternal Grandmother    Hypertension Maternal Grandmother    Heart disease Maternal Grandmother     Social History   Tobacco Use   Smoking status: Never   Smokeless tobacco: Never  Vaping Use   Vaping status: Never Used  Substance Use Topics   Alcohol use: Not Currently    Comment: pt states that she has not used since finding out about the pregnancy   Drug use: Not Currently    Types: Marijuana    Comment: pt states that she has not used since finding out about the pregnancy    Allergies: No Known Allergies  Medications Prior to Admission  Medication Sig Dispense Refill Last Dose/Taking   promethazine  (PHENERGAN ) 25 MG tablet Take 1 tablet (25 mg total) by mouth every 6 (six) hours as needed for nausea or vomiting. 30 tablet 2     Review of  Systems  Constitutional: Negative.  Negative for fatigue and fever.  HENT: Negative.    Respiratory: Negative.  Negative for shortness of breath.   Cardiovascular: Negative.  Negative for chest pain.  Gastrointestinal: Negative.  Negative for abdominal pain, constipation, diarrhea, nausea and vomiting.  Genitourinary: Negative.  Negative for dysuria.  Neurological: Negative.  Negative for dizziness and headaches.   Physical Exam   Blood pressure (!) 142/79, pulse 97, temperature 98.8 F (37.1 C), temperature source Oral, resp. rate 18, height 5' 2 (1.575 m), weight 80.6 kg, last menstrual period 12/10/2023, SpO2 99%.  Patient Vitals for the past 24 hrs:  BP Temp Temp src Pulse Resp SpO2 Height Weight  02/15/24 1737 (!) 142/79 98.8 F (37.1 C) Oral 97 18 99 % -- --  02/15/24 1733 -- -- -- -- -- -- 5' 2 (1.575 m) 80.6 kg    Physical Exam Vitals and nursing note reviewed.  Constitutional:      General: She is not in acute distress.    Appearance: She is well-developed.  HENT:     Head:  Normocephalic.  Eyes:     Pupils: Pupils are equal, round, and reactive to light.  Cardiovascular:     Rate and Rhythm: Normal rate and regular rhythm.     Heart sounds: Normal heart sounds.  Pulmonary:     Effort: Pulmonary effort is normal. No respiratory distress.     Breath sounds: Normal breath sounds.  Abdominal:     General: Bowel sounds are normal. There is no distension.     Palpations: Abdomen is soft.     Tenderness: There is no abdominal tenderness.  Skin:    General: Skin is warm and dry.  Neurological:     Mental Status: She is alert and oriented to person, place, and time.  Psychiatric:        Mood and Affect: Mood normal.        Behavior: Behavior normal.        Thought Content: Thought content normal.        Judgment: Judgment normal.      MAU Course  Procedures  Results for orders placed or performed during the hospital encounter of 02/15/24 (from the past 24 hours)  CBC     Status: None   Collection Time: 02/15/24  6:11 PM  Result Value Ref Range   WBC 8.8 4.0 - 10.5 K/uL   RBC 5.06 3.87 - 5.11 MIL/uL   Hemoglobin 13.5 12.0 - 15.0 g/dL   HCT 59.0 63.9 - 53.9 %   MCV 80.8 80.0 - 100.0 fL   MCH 26.7 26.0 - 34.0 pg   MCHC 33.0 30.0 - 36.0 g/dL   RDW 85.5 88.4 - 84.4 %   Platelets 381 150 - 400 K/uL   nRBC 0.0 0.0 - 0.2 %  Urinalysis, Routine w reflex microscopic -Urine, Clean Catch     Status: Abnormal   Collection Time: 02/15/24  6:42 PM  Result Value Ref Range   Color, Urine YELLOW YELLOW   APPearance CLEAR CLEAR   Specific Gravity, Urine 1.025 1.005 - 1.030   pH 6.0 5.0 - 8.0   Glucose, UA 150 (A) NEGATIVE mg/dL   Hgb urine dipstick NEGATIVE NEGATIVE   Bilirubin Urine NEGATIVE NEGATIVE   Ketones, ur NEGATIVE NEGATIVE mg/dL   Protein, ur NEGATIVE NEGATIVE mg/dL   Nitrite NEGATIVE NEGATIVE   Leukocytes,Ua NEGATIVE NEGATIVE     No results found.   MDM Labs ordered and reviewed.  UA CBC Tylenol  PO- patient reports resolution of  pain Requested nausea medication- zofran  ODT  Low suspicion for appendicitis due to location of pain and absence of fever, leukocytosis and GI complaints.   Reassurance of normalcy of exam. Patient has ultrasound with live IUP on 10/7. No concerning symptoms for SAB. Will follow up in 2 weeks at [facility]. Discussed early antihypertensive use to manage HTN if still elevated.   Assessment and Plan   1. Nausea and vomiting during pregnancy   2. [redacted] weeks gestation of pregnancy   3. Chronic hypertension affecting pregnancy     -Discharge home in stable condition -First trimester precautions discussed -Patient advised to follow-up with OB as scheduled for prenatal care. -Patient may return to MAU as needed or if her condition were to change or worsen  Aleck CHRISTELLA Fireman, CNM 02/15/2024, 6:59 PM

## 2024-02-15 NOTE — Discharge Instructions (Signed)

## 2024-02-15 NOTE — Progress Notes (Signed)
 Written and verbal d/c instructions given and pt voiced understanding.

## 2024-02-15 NOTE — MAU Note (Signed)
 Alexis Blanchard is a 28 y.o. at [redacted]w[redacted]d here in MAU reporting: she's having RLQ pain that began this morning.  States it's a constant aching pain.  Denies taking meds to treat.  Denies VB.  LMP: 12/10/2023 Onset of complaint: today Pain score: 6 Vitals:   02/15/24 1737  BP: (!) 142/79  Pulse: 97  Resp: 18  Temp: 98.8 F (37.1 C)  SpO2: 99%     FHT: NA  Lab orders placed from triage: UA

## 2024-02-21 ENCOUNTER — Encounter: Admitting: Certified Nurse Midwife

## 2024-02-27 ENCOUNTER — Other Ambulatory Visit (INDEPENDENT_AMBULATORY_CARE_PROVIDER_SITE_OTHER): Payer: Self-pay

## 2024-02-27 ENCOUNTER — Ambulatory Visit (INDEPENDENT_AMBULATORY_CARE_PROVIDER_SITE_OTHER): Admitting: Obstetrics and Gynecology

## 2024-02-27 ENCOUNTER — Other Ambulatory Visit: Payer: Self-pay | Admitting: Obstetrics and Gynecology

## 2024-02-27 VITALS — BP 148/92 | HR 99 | Wt 177.0 lb

## 2024-02-27 DIAGNOSIS — O0990 Supervision of high risk pregnancy, unspecified, unspecified trimester: Secondary | ICD-10-CM

## 2024-02-27 DIAGNOSIS — O0991 Supervision of high risk pregnancy, unspecified, first trimester: Secondary | ICD-10-CM

## 2024-02-27 DIAGNOSIS — Z8759 Personal history of other complications of pregnancy, childbirth and the puerperium: Secondary | ICD-10-CM | POA: Diagnosis not present

## 2024-02-27 DIAGNOSIS — O34219 Maternal care for unspecified type scar from previous cesarean delivery: Secondary | ICD-10-CM

## 2024-02-27 DIAGNOSIS — Z3A10 10 weeks gestation of pregnancy: Secondary | ICD-10-CM

## 2024-02-27 DIAGNOSIS — O131 Gestational [pregnancy-induced] hypertension without significant proteinuria, first trimester: Secondary | ICD-10-CM | POA: Insufficient documentation

## 2024-02-27 DIAGNOSIS — O10911 Unspecified pre-existing hypertension complicating pregnancy, first trimester: Secondary | ICD-10-CM

## 2024-02-27 DIAGNOSIS — D563 Thalassemia minor: Secondary | ICD-10-CM | POA: Diagnosis not present

## 2024-02-27 DIAGNOSIS — Z3481 Encounter for supervision of other normal pregnancy, first trimester: Secondary | ICD-10-CM | POA: Diagnosis not present

## 2024-02-27 MED ORDER — LABETALOL HCL 100 MG PO TABS: 100.0000 mg | ORAL_TABLET | Freq: Two times a day (BID) | ORAL | 1 refills | Status: DC

## 2024-02-27 NOTE — Progress Notes (Signed)

## 2024-02-27 NOTE — Progress Notes (Signed)
 New OB Note  02/27/2024   Clinic: [facility]  Chief Complaint: new OB  Transfer of Care Patient: no  History of Present Illness: Alexis Blanchard is a 28 y.o. H4E8877 at 10/0 weeks (EDC 5/19, based on 8wk u/s) Patient's last menstrual period was 12/10/2023.  Pregnancy complicated by has Alpha thalassemia silent carrier; History of severe pre-eclampsia; VBAC (vaginal birth after Cesarean); Supervision of high risk pregnancy, antepartum; and Transient hypertension of pregnancy in first trimester on their problem list.   No SAB s/s but with mild to moderate nausea with pregnancy and GERD.  ROS: A 12-point review of systems was performed and negative, except as stated in the above HPI.  OBGYN History: As per HPI. OB History  Gravida Para Term Preterm AB Living  5 2 1 1 2 2   SAB IAB Ectopic Multiple Live Births  2 0 0 0 2    # Outcome Date GA Lbr Len/2nd Weight Sex Type Anes PTL Lv  5 Current           4 Term 11/24/20 [redacted]w[redacted]d / 00:24 6 lb 5.4 oz (2.875 kg) M VBAC EPI  LIV     Birth Comments: WNL  3 SAB 04/2019          2 Preterm 06/08/17 [redacted]w[redacted]d  4 lb 9.7 oz (2.09 kg) M CS-LTranv Spinal  LIV     Birth Comments: Severe preeclampsia, Breech      Complications: Severe preeclampsia, Breech presentation  1 SAB            History of pap smears: Yes. Last pap smear 11/2023 and results were negative pap and HPV   Past Medical History: Past Medical History:  Diagnosis Date   Alpha thalassemia silent carrier 01/31/2017   COVID-19 affecting pregnancy in first trimester 06/16/2020   COVID positive on 06/03/20. unvaccinated           De Quervain's tenosynovitis, right 12/06/2021   Excessive fetal growth affecting management of mother in second trimester, antepartum 10/06/2020   [ ]  f/u rpt growth Normal 2h GTT  4/12: 92%, 684g, ac 64%, FL 94%, nl AFI   False positive RPR test 05/20/2020   Negative T.pallidium confirmatory test   Gestational diabetes    first preg    Severe preeclampsia    Delivered at 34 weeks   Syphilis    UTI (urinary tract infection)     Past Surgical History: Past Surgical History:  Procedure Laterality Date   CESAREAN SECTION N/A 06/08/2017   Procedure: CESAREAN SECTION;  Surgeon: Corene Coy, MD;  Location: [facility] BIRTHING SUITES;  Service: Obstetrics;  Laterality: N/A;   REMOVAL OF IMPLANON  ROD  10/05/2023   TONSILLECTOMY      Family History:  Family History  Problem Relation Age of Onset   Hypertension Mother    Heart disease Maternal Aunt        CHF   Cancer Maternal Aunt 45       colon   Cancer Maternal Aunt 45       throat   Congestive Heart Failure Maternal Uncle    Diabetes Maternal Grandmother    Hypertension Maternal Grandmother    Heart disease Maternal Grandmother     Social History:  Social History   Socioeconomic History   Marital status: Single    Spouse name: Not on file   Number of children: Not on file   Years of education: Not on file   Highest education level: Not on  file  Occupational History   Not on file  Tobacco Use   Smoking status: Never   Smokeless tobacco: Never  Vaping Use   Vaping status: Never Used  Substance and Sexual Activity   Alcohol use: Not Currently    Comment: pt states that she has not used since finding out about the pregnancy   Drug use: Not Currently    Types: Marijuana    Comment: pt states that she has not used since finding out about the pregnancy   Sexual activity: Yes    Birth control/protection: None  Other Topics Concern   Not on file  Social History Narrative   Not on file   Social Drivers of Health   Financial Resource Strain: Medium Risk (02/06/2024)   Received from [facility]   Overall Financial Resource Strain (CARDIA)    How hard is it for you to pay for the very basics like food, housing, medical care, and heating?: Somewhat hard  Food Insecurity: No Food Insecurity (02/06/2024)   Received from [facility]   Hunger Vital  Sign    Within the past 12 months, you worried that your food would run out before you got the money to buy more.: Never true    Within the past 12 months, the food you bought just didn't last and you didn't have money to get more.: Never true  Transportation Needs: No Transportation Needs (02/06/2024)   Received from [facility] - Transportation    In the past 12 months, has lack of transportation kept you from medical appointments or from getting medications?: No    In the past 12 months, has lack of transportation kept you from meetings, work, or from getting things needed for daily living?: No  Physical Activity: Inactive (02/06/2024)   Received from [facility]   Exercise Vital Sign    Days of Exercise per Week: Not on file    On average, how many minutes do you engage in exercise at this level?: 0 min  Stress: No Stress Concern Present (02/06/2024)   Received from [facility] - Occupational Stress Questionnaire    Do you feel stress - tense, restless, nervous, or anxious, or unable to sleep at night because your mind is troubled all the time - these days?: Not at all  Social Connections: Socially Integrated (02/06/2024)   Received from [facility]   Social Network    How would you rate your social network (family, work, friends)?: Good participation with social networks  Intimate Partner Violence: Not At Risk (02/06/2024)   Received from [facility]   HITS    Over the last 12 months how often did your partner physically hurt you?: Never    Over the last 12 months how often did your partner insult you or talk down to you?: Never    Over the last 12 months how often did your partner threaten you with physical harm?: Never    Over the last 12 months how often did your partner scream or curse at you?: Rarely   Allergy: No Known Allergies  Current Outpatient Medications: Prenatal vitamin  Physical Exam:   BP (!) 148/92    Pulse 99   Wt 177 lb (80.3 kg)   LMP 12/10/2023   BMI 32.37 kg/m  Body mass index is 32.37 kg/m. Contractions: Not present Vag. Bleeding: None. Fundal height: not applicable FHTs: 150s  General appearance: Well nourished, well developed female  in no acute distress.  Neck:  Supple, normal appearance, and no thyromegaly  Cardiovascular: S1, S2 normal, no murmur, rub or gallop, regular rate and rhythm Respiratory:  Clear to auscultation bilateral. Normal respiratory effort Abdomen: positive bowel sounds and no masses, hernias; diffusely non tender to palpation, non distended Breasts: no s/s. Neuro/Psych:  Normal mood and affect.  Skin:  Warm and dry.   Laboratory: none  Imaging:  No new imaging  Assessment: patient stable  Plan: 1. Supervision of high risk pregnancy, antepartum (Primary) Schedule anatomy u/s next visit - CBC/D/Plt+RPR+Rh+ABO+RubIgG... - Hemoglobin A1c - Culture, OB Urine - Protein / creatinine ratio, urine - TSH Rfx on Abnormal to Free T4 - US  OB Limited; Future  2. Transient hypertension of pregnancy in first trimester See below  3. History of VBAC (vaginal birth after Cesarean)   4. History of severe pre-eclampsia See below  5. Alpha thalassemia silent carrier  6. [redacted] weeks gestation of pregnancy - CBC/D/Plt+RPR+Rh+ABO+RubIgG... - Hemoglobin A1c - Culture, OB Urine - Protein / creatinine ratio, urine - TSH Rfx on Abnormal to Free T4 - US  OB Limited; Future  7. Chronic hypertension complicating or reason for care during pregnancy, first trimester I reviewed her BPs outside of pregnancy and recommend giving her the diagnosis of CHTN and starting medications, which she is amenable to. Labetalol  100 bid started and RTC 2wks for BP check. Can start low dose ASA at that time - Protein / creatinine ratio, urine - TSH Rfx on Abnormal to Free T4  8. Encounter for supervision of other normal pregnancy in first trimester - PANORAMA PRENATAL  TEST  Problem list reviewed and updated.  >50% of 35 min visit spent on counseling and coordination of care.  Return in about 2 weeks (around 03/12/2024) for in person, md or app, high risk ob.  No future appointments.  Bebe Izell Overcast MD Attending Center for [facility])

## 2024-02-27 NOTE — Progress Notes (Signed)
 NOB   Would like Rx sent for PNV's  Genetic Screening:yes Last Pap:11/08/23 WNL    CC: Elevated B/P reading at home 140/92 around 7am.

## 2024-03-02 LAB — CBC/D/PLT+RPR+RH+ABO+RUBIGG...
Antibody Screen: NEGATIVE
Basophils Absolute: 0.1 x10E3/uL (ref 0.0–0.2)
Basos: 1 %
EOS (ABSOLUTE): 0.3 x10E3/uL (ref 0.0–0.4)
Eos: 3 %
HCV Ab: NONREACTIVE
HIV Screen 4th Generation wRfx: NONREACTIVE
Hematocrit: 46.2 % (ref 34.0–46.6)
Hemoglobin: 14.7 g/dL (ref 11.1–15.9)
Hepatitis B Surface Ag: NEGATIVE
Immature Grans (Abs): 0 x10E3/uL (ref 0.0–0.1)
Immature Granulocytes: 0 %
Lymphocytes Absolute: 2.7 x10E3/uL (ref 0.7–3.1)
Lymphs: 30 %
MCH: 27 pg (ref 26.6–33.0)
MCHC: 31.8 g/dL (ref 31.5–35.7)
MCV: 85 fL (ref 79–97)
Monocytes Absolute: 0.6 x10E3/uL (ref 0.1–0.9)
Monocytes: 7 %
Neutrophils Absolute: 5.2 x10E3/uL (ref 1.4–7.0)
Neutrophils: 59 %
Platelets: 370 x10E3/uL (ref 150–450)
RBC: 5.44 x10E6/uL — ABNORMAL HIGH (ref 3.77–5.28)
RDW: 13.8 % (ref 11.7–15.4)
RPR Ser Ql: NONREACTIVE
Rh Factor: POSITIVE
Rubella Antibodies, IGG: 2.89 {index} (ref >0.99)
WBC: 8.9 x10E3/uL (ref 3.4–10.8)

## 2024-03-02 LAB — HCV INTERPRETATION

## 2024-03-02 LAB — CULTURE, OB URINE

## 2024-03-02 LAB — PROTEIN / CREATININE RATIO, URINE
Creatinine, Urine: 38.8 mg/dL
Protein, Ur: <4 mg/dL

## 2024-03-02 LAB — HEMOGLOBIN A1C
Est. average glucose Bld gHb Est-mCnc: 114 mg/dL
Hgb A1c MFr Bld: 5.6 % (ref 4.8–5.6)

## 2024-03-02 LAB — TSH RFX ON ABNORMAL TO FREE T4: TSH: 1.35 u[IU]/mL (ref 0.450–4.500)

## 2024-03-02 LAB — URINE CULTURE, OB REFLEX

## 2024-03-04 LAB — PANORAMA PRENATAL TEST FULL PANEL:PANORAMA TEST PLUS 5 ADDITIONAL MICRODELETIONS: FETAL FRACTION: 5.2

## 2024-03-14 ENCOUNTER — Ambulatory Visit (INDEPENDENT_AMBULATORY_CARE_PROVIDER_SITE_OTHER): Admitting: Family Medicine

## 2024-03-14 VITALS — BP 129/86 | HR 74 | Wt 177.4 lb

## 2024-03-14 DIAGNOSIS — O10911 Unspecified pre-existing hypertension complicating pregnancy, first trimester: Secondary | ICD-10-CM | POA: Diagnosis not present

## 2024-03-14 DIAGNOSIS — O0990 Supervision of high risk pregnancy, unspecified, unspecified trimester: Secondary | ICD-10-CM | POA: Diagnosis not present

## 2024-03-14 DIAGNOSIS — O34219 Maternal care for unspecified type scar from previous cesarean delivery: Secondary | ICD-10-CM

## 2024-03-14 DIAGNOSIS — D563 Thalassemia minor: Secondary | ICD-10-CM | POA: Diagnosis not present

## 2024-03-14 DIAGNOSIS — Z3A12 12 weeks gestation of pregnancy: Secondary | ICD-10-CM | POA: Diagnosis not present

## 2024-03-14 MED ORDER — ASPIRIN 81 MG PO TBEC: 81.0000 mg | DELAYED_RELEASE_TABLET | Freq: Every day | ORAL | 3 refills | Status: AC

## 2024-03-16 NOTE — Progress Notes (Signed)
 PRENATAL VISIT NOTE  Subjective:  Alexis Blanchard is a 28 y.o. [id number] at [redacted]w[redacted]d being seen today for ongoing prenatal care.  She is currently monitored for the following issues for this low-risk pregnancy and has Alpha thalassemia silent carrier; History of severe pre-eclampsia; Chronic hypertension complicating or reason for care during pregnancy, first trimester; VBAC (vaginal birth after Cesarean); Supervision of high risk pregnancy, antepartum; and Transient hypertension of pregnancy in first trimester on their problem list.  Patient reports no complaints.   . Vag. Bleeding: None.   . Denies leaking of fluid.   The following portions of the patient's history were reviewed and updated as appropriate: allergies, current medications, past family history, past medical history, past social history, past surgical history and problem list.   Objective:   Vitals:   03/14/24 1451  BP: 129/86  Pulse: 74  Weight: 177 lb 6.4 oz (80.5 kg)    Fetal Status:  Fetal Heart Rate (bpm): 154        General: Alert, oriented and cooperative. Patient is in no acute distress.  Skin: Skin is warm and dry. No rash noted.   Cardiovascular: Normal heart rate noted  Respiratory: Normal respiratory effort, no problems with respiration noted  Abdomen: Soft, gravid, appropriate for gestational age.  Pain/Pressure:  (pain on right side occasionally)     Pelvic: Cervical exam deferred        Extremities: Normal range of motion.     Mental Status: Normal mood and affect. Normal behavior. Normal judgment and thought content.      02/27/2024   10:05 AM 05/15/2017    3:37 PM  Depression screen PHQ 2/9  Decreased Interest 0 0  Down, Depressed, Hopeless 0 0  PHQ - 2 Score 0 0  Altered sleeping 2   Tired, decreased energy 1   Change in appetite 0   Feeling bad or failure about yourself  0   Trouble concentrating 0   Moving slowly or fidgety/restless 0   Suicidal thoughts 0   PHQ-9 Score 3    Difficult  doing work/chores Not difficult at all      Data saved with a previous flowsheet row definition        02/27/2024   10:06 AM  GAD 7 : Generalized Anxiety Score  Nervous, Anxious, on Edge 0  Control/stop worrying 0  Worry too much - different things 1  Trouble relaxing 1  Restless 0  Easily annoyed or irritable 0  Afraid - awful might happen 0  Total GAD 7 Score 2  Anxiety Difficulty Not difficult at all    Assessment and Plan:  Pregnancy: [id number] at [redacted]w[redacted]d 1. Chronic hypertension complicating or reason for care during pregnancy, first trimester (Primary) Place on ASA BP is well controlled on Labetalol  100 mg bid - aspirin  EC 81 MG tablet; Take 1 tablet (81 mg total) by mouth daily.  Dispense: 90 tablet; Refill: 3  2. VBAC (vaginal birth after Cesarean)   3. Supervision of high risk pregnancy, antepartum Continue prenatal care.  4. Alpha thalassemia silent carrier   5. [redacted] weeks gestation of pregnancy   General obstetric precautions including but not limited to vaginal bleeding, contractions, leaking of fluid and fetal movement were reviewed in detail with the patient. Please refer to After Visit Summary for other counseling recommendations.   Return in 4 weeks (on 04/11/2024) for needs U/S PLEASE SCHEDULE ANATOMY U/S.  Future Appointments  Date Time Provider Department Center  04/11/2024  1:50  PM Izell Harari, MD [facility]  05/10/2024  9:35 AM Izell Harari, MD [facility]  05/14/2024  7:00 AM [facility] PROVIDER 1 [facility]  05/14/2024  7:30 AM [facility] US4 [facility]    Alexis GORMAN Birk, MD

## 2024-03-18 ENCOUNTER — Encounter: Payer: Self-pay | Admitting: Family Medicine

## 2024-03-19 DIAGNOSIS — J029 Acute pharyngitis, unspecified: Secondary | ICD-10-CM | POA: Diagnosis not present

## 2024-03-19 DIAGNOSIS — Z20822 Contact with and (suspected) exposure to covid-19: Secondary | ICD-10-CM | POA: Diagnosis not present

## 2024-03-19 DIAGNOSIS — R509 Fever, unspecified: Secondary | ICD-10-CM | POA: Diagnosis not present

## 2024-03-19 DIAGNOSIS — R051 Acute cough: Secondary | ICD-10-CM | POA: Diagnosis not present

## 2024-03-19 DIAGNOSIS — B349 Viral infection, unspecified: Secondary | ICD-10-CM | POA: Diagnosis not present

## 2024-03-30 DIAGNOSIS — N760 Acute vaginitis: Secondary | ICD-10-CM | POA: Diagnosis not present

## 2024-03-30 DIAGNOSIS — Z7251 High risk heterosexual behavior: Secondary | ICD-10-CM | POA: Diagnosis not present

## 2024-04-09 ENCOUNTER — Encounter: Payer: Self-pay | Admitting: Obstetrics and Gynecology

## 2024-04-09 NOTE — Progress Notes (Signed)
 Subjective:    Alexis Blanchard is a 28 y.o. y.o. female who presents for evaluation of abdominal pain. The pain is described as aching, and is mild/10 in intensity. Pain is located in the RLQ without radiation. Onset was several weeks ago. Symptoms have been unchanged since. Aggravating factors: Elevated blood pressure 166/85 at home today.  Alleviating factors: none. Associated symptoms: 16 weeks pregnancy. The patient denies anorexia, arthralgias, belching, chills, constipation, diarrhea, dysuria, fever, flatus, frequency, headache, hematochezia, hematuria, melena, myalgias, nausea, sweats, and vomiting.   Patient has no known allergies.  Current Outpatient Medications  Medication Instructions  . aspirin  81 mg, Daily  . etonogestreL  (NEXPLANON ) 68 mg, Once  . labetaloL  (NORMODYNE ) 100 mg  . metroNIDAZOLE  (FLAGYL ) 500 mg, oral, 2 times daily, Do not drink alcohol while taking this medication.  . ondansetron  (ZOFRAN -ODT) 8 mg, oral, Every 8 hours PRN    Past Medical History: No date: Gestational diabetes (CMD)  Review of Systems Pertinent items are noted in HPI.  Prior history for preeclampsia with her first pregnancy.  Second pregnancy was normal.  History for gestational hypertension.  Objective:    BP 129/78 (BP Location: Left arm, Patient Position: Sitting)   Pulse 96   Temp 98.1 F (36.7 C) (Oral)   Resp 20   Ht 1.575 m (5' 2)   Wt 80.3 kg (177 lb)   LMP 12/10/2023 (Approximate)   SpO2 99%   BMI 32.37 kg/m  General appearance: alert, appears stated age, cooperative, no distress, moderately obese, and worried, here with her 2 young son Head: Normocephalic, without obvious abnormality, atraumatic Eyes: PERRLA EOMI Neck: no adenopathy, no JVD, supple, symmetrical, trachea midline, and thyroid  not enlarged, symmetric, no tenderness/mass/nodules Lungs: clear to auscultation bilaterally Heart: regular rate and rhythm, S1, S2 normal, no murmur, click, rub or gallop Abdomen: normal  findings: bowel sounds normal, liver span normal to percussion, no bruits heard, no masses palpable, no organomegaly, no renal abnormalities palpable, spleen non-palpable, symmetric, and umbilicus normal and abnormal findings:  mild tenderness in the RLQ Pelvic: deferred and suprapubic pressure and discomfort, CVA nontender Pulses: 2+ and symmetric Skin: Skin color, texture, turgor normal. No rashes or lesions Lymph nodes: Cervical, supraclavicular, and axillary nodes normal. Neurologic: Grossly normal   Recent Results (from the past 24 hours)  POC Urinalysis Auto without Microscopic   Collection Time: 04/09/24  7:47 PM  Result Value Ref Range   Color, Urine Light Yellow Yellow   Clarity, Urine Cloudy (A) Clear   Glucose, Urine Negative Negative mg/dL   Bilirubin, Urine Negative Negative   Ketones, Urine Negative Negative mg/dL   Specific Gravity, Urine 1.015 1.010, 1.015, 1.020, 1.025   Blood, Urine Negative Negative   pH, Urine 6.5 5.0, 5.5, 6.0, 6.5, 7.0, 7.5, 8.0   Protein, Urine Negative Negative mg/dL   Urobilinogen, Urine 0.2 <2.0 mg/dL   Nitrite, Urine Negative Negative   Leukocyte Esterase, Urine Negative Negative   Kit/Device Lot # [id number]    Kit/Device Expiration Date 03-08-2025      Assessment/Plan:   1. Abdominal pain, unspecified abdominal location  POC Urinalysis Auto without Microscopic   Urine Culture    2. [redacted] weeks gestation of pregnancy (CMD)  POC Urinalysis Auto without Microscopic   Urine Culture     Discussed with patient with persistent abdominal pain and elevated blood pressure and a prior history of preeclampsia.  Concerning for preeclampsia although testing is negative in the clinic today.  Stressed she needs further evaluation and management at  the ER to rule out preeclampsia given her history.  Patient referred to [facility] ER since she lives in [place] and prefers a closer ER.  Patient will go to [facility] ER tonight.  Guss Aye, PA-C Tue 04/09/2024, 8:12 PM

## 2024-04-11 ENCOUNTER — Ambulatory Visit: Admitting: Obstetrics and Gynecology

## 2024-04-11 VITALS — BP 135/88 | HR 103 | Wt 178.0 lb

## 2024-04-11 DIAGNOSIS — O34219 Maternal care for unspecified type scar from previous cesarean delivery: Secondary | ICD-10-CM

## 2024-04-11 DIAGNOSIS — O10912 Unspecified pre-existing hypertension complicating pregnancy, second trimester: Secondary | ICD-10-CM

## 2024-04-11 DIAGNOSIS — Z8759 Personal history of other complications of pregnancy, childbirth and the puerperium: Secondary | ICD-10-CM

## 2024-04-11 DIAGNOSIS — O10911 Unspecified pre-existing hypertension complicating pregnancy, first trimester: Secondary | ICD-10-CM

## 2024-04-11 DIAGNOSIS — O0992 Supervision of high risk pregnancy, unspecified, second trimester: Secondary | ICD-10-CM

## 2024-04-11 DIAGNOSIS — Z3A16 16 weeks gestation of pregnancy: Secondary | ICD-10-CM | POA: Diagnosis not present

## 2024-04-11 DIAGNOSIS — O0990 Supervision of high risk pregnancy, unspecified, unspecified trimester: Secondary | ICD-10-CM

## 2024-04-11 NOTE — Progress Notes (Signed)
 PRENATAL VISIT NOTE  Subjective:  Alexis Blanchard is a 28 y.o. [id number] at [redacted]w[redacted]d being seen today for ongoing prenatal care.  She is currently monitored for the following issues for this high-risk pregnancy and has Alpha thalassemia silent carrier; History of severe pre-eclampsia; Chronic hypertension complicating or reason for care during pregnancy, first trimester; VBAC (vaginal birth after Cesarean); Supervision of high risk pregnancy, antepartum; and Transient hypertension of pregnancy in first trimester on their problem list.  Patient reports no complaints.  Contractions: Not present. Vag. Bleeding: None.  Movement: Absent. Denies leaking of fluid.   The following portions of the patient's history were reviewed and updated as appropriate: allergies, current medications, past family history, past medical history, past social history, past surgical history and problem list.   Objective:   Vitals:   04/11/24 1404  BP: 135/88  Pulse: (!) 103  Weight: 178 lb (80.7 kg)    Fetal Status:  Fetal Heart Rate (bpm): 153   Movement: Absent    General: Alert, oriented and cooperative. Patient is in no acute distress.  Skin: Skin is warm and dry. No rash noted.   Cardiovascular: Normal heart rate noted  Respiratory: Normal respiratory effort, no problems with respiration noted  Abdomen: Soft, gravid, appropriate for gestational age.  Pain/Pressure: Absent     Pelvic: Cervical exam deferred        Extremities: Normal range of motion.     Mental Status: Normal mood and affect. Normal behavior. Normal judgment and thought content.      02/27/2024   10:05 AM 05/15/2017    3:37 PM  Depression screen PHQ 2/9  Decreased Interest 0 0  Down, Depressed, Hopeless 0 0  PHQ - 2 Score 0 0  Altered sleeping 2   Tired, decreased energy 1   Change in appetite 0   Feeling bad or failure about yourself  0   Trouble concentrating 0   Moving slowly or fidgety/restless 0   Suicidal thoughts 0   PHQ-9  Score 3    Difficult doing work/chores Not difficult at all      Data saved with a previous flowsheet row definition        02/27/2024   10:06 AM  GAD 7 : Generalized Anxiety Score  Nervous, Anxious, on Edge 0  Control/stop worrying 0  Worry too much - different things 1  Trouble relaxing 1  Restless 0  Easily annoyed or irritable 0  Afraid - awful might happen 0  Total GAD 7 Score 2  Anxiety Difficulty Not difficult at all    Assessment and Plan:  Pregnancy: [id number] at [redacted]w[redacted]d 1. [redacted] weeks gestation of pregnancy (Primary) Routine care. Follow up anatomy u/s  2. VBAC (vaginal birth after Cesarean)  3. Chronic hypertension complicating or reason for care during pregnancy, first trimester D/w her re: low dose ASA. Pt confirms on labetalol  100 bid  4. History of severe pre-eclampsia  5. Supervision of high risk pregnancy, antepartum  Preterm labor symptoms and general obstetric precautions including but not limited to vaginal bleeding, contractions, leaking of fluid and fetal movement were reviewed in detail with the patient. Please refer to After Visit Summary for other counseling recommendations.   No follow-ups on file.  Future Appointments  Date Time Provider Department Center  05/10/2024  9:35 AM Izell Harari, MD [facility]  05/14/2024  7:00 AM [facility] PROVIDER 1 [facility]  05/14/2024  7:30 AM [facility] US4 [facility]    Harari Izell, MD

## 2024-04-29 ENCOUNTER — Other Ambulatory Visit: Payer: Self-pay | Admitting: Obstetrics and Gynecology

## 2024-05-10 ENCOUNTER — Ambulatory Visit (INDEPENDENT_AMBULATORY_CARE_PROVIDER_SITE_OTHER): Admitting: Obstetrics and Gynecology

## 2024-05-10 VITALS — BP 130/79 | HR 93 | Wt 179.0 lb

## 2024-05-10 DIAGNOSIS — O34219 Maternal care for unspecified type scar from previous cesarean delivery: Secondary | ICD-10-CM

## 2024-05-10 DIAGNOSIS — Z8759 Personal history of other complications of pregnancy, childbirth and the puerperium: Secondary | ICD-10-CM | POA: Diagnosis not present

## 2024-05-10 DIAGNOSIS — Z3A20 20 weeks gestation of pregnancy: Secondary | ICD-10-CM | POA: Diagnosis not present

## 2024-05-10 DIAGNOSIS — O10912 Unspecified pre-existing hypertension complicating pregnancy, second trimester: Secondary | ICD-10-CM

## 2024-05-10 DIAGNOSIS — O10911 Unspecified pre-existing hypertension complicating pregnancy, first trimester: Secondary | ICD-10-CM

## 2024-05-10 MED ORDER — LABETALOL HCL 100 MG PO TABS: 100.0000 mg | ORAL_TABLET | Freq: Two times a day (BID) | ORAL | 2 refills | Status: AC

## 2024-05-10 NOTE — Progress Notes (Signed)
 ROB: Pt stating that she is getting sick every 2-3 weeks  Otherwise doing well

## 2024-05-10 NOTE — Progress Notes (Signed)
 "  PRENATAL VISIT NOTE  Subjective:  Alexis Blanchard is a 29 y.o. [id number] at [redacted]w[redacted]d being seen today for ongoing prenatal care.  She is currently monitored for the following issues for this high-risk pregnancy and has Alpha thalassemia silent carrier; History of severe pre-eclampsia; Chronic hypertension complicating or reason for care during pregnancy, first trimester; VBAC (vaginal birth after Cesarean); and Supervision of high risk pregnancy, antepartum on their problem list.  Patient reports getting URI s/s every few weeks.  Contractions: Not present. Vag. Bleeding: None.  Movement: Present. Denies leaking of fluid.   The following portions of the patient's history were reviewed and updated as appropriate: allergies, current medications, past family history, past medical history, past social history, past surgical history and problem list.   Objective:   Vitals:   05/10/24 0941  BP: 130/79  Pulse: 93  Weight: 179 lb (81.2 kg)    Fetal Status:  Fetal Heart Rate (bpm): 147-150   Movement: Present    General: Alert, oriented and cooperative. Patient is in no acute distress.  Skin: Skin is warm and dry. No rash noted.   Cardiovascular: Normal heart rate noted  Respiratory: Normal respiratory effort, no problems with respiration noted  Abdomen: Soft, gravid, appropriate for gestational age.  Pain/Pressure: Absent     Pelvic: Cervical exam deferred        Extremities: Normal range of motion.  Edema: None  Mental Status: Normal mood and affect. Normal behavior. Normal judgment and thought content.      02/27/2024   10:05 AM 05/15/2017    3:37 PM  Depression screen PHQ 2/9  Decreased Interest 0 0  Down, Depressed, Hopeless 0 0  PHQ - 2 Score 0 0  Altered sleeping 2   Tired, decreased energy 1   Change in appetite 0   Feeling bad or failure about yourself  0   Trouble concentrating 0   Moving slowly or fidgety/restless 0   Suicidal thoughts 0   PHQ-9 Score 3    Difficult doing  work/chores Not difficult at all      Data saved with a previous flowsheet row definition        02/27/2024   10:06 AM  GAD 7 : Generalized Anxiety Score  Nervous, Anxious, on Edge 0  Control/stop worrying 0  Worry too much - different things 1  Trouble relaxing 1  Restless 0  Easily annoyed or irritable 0  Afraid - awful might happen 0  Total GAD 7 Score 2  Anxiety Difficulty Not difficult at all    Assessment and Plan:  Pregnancy: [id number] at [redacted]w[redacted]d 1. [redacted] weeks gestation of pregnancy (Primary) Routine care. Follow up anatomy u/s. Safe OTCs for URI d/w her  2. VBAC (vaginal birth after Cesarean)  3. Chronic hypertension complicating or reason for care during pregnancy, first trimester D/w her re: low dose ASA. Pt confirms on labetalol  100 bid  4. History of severe pre-eclampsia  5. Supervision of high risk pregnancy, antepartum  Preterm labor symptoms and general obstetric precautions including but not limited to vaginal bleeding, contractions, leaking of fluid and fetal movement were reviewed in detail with the patient. Please refer to After Visit Summary for other counseling recommendations.   No follow-ups on file.  Future Appointments  Date Time Provider Department Center  05/14/2024  7:00 AM [facility] PROVIDER 1 [facility]  05/14/2024  7:30 AM [facility] US4 [facility]  06/11/2024 10:55 AM Anyanwu, Gloris LABOR, MD [facility]  Bebe Furry, MD  "

## 2024-05-14 ENCOUNTER — Ambulatory Visit (HOSPITAL_BASED_OUTPATIENT_CLINIC_OR_DEPARTMENT_OTHER)

## 2024-05-14 ENCOUNTER — Ambulatory Visit: Attending: Obstetrics | Admitting: Obstetrics

## 2024-05-14 VITALS — BP 133/72

## 2024-05-14 DIAGNOSIS — O99212 Obesity complicating pregnancy, second trimester: Secondary | ICD-10-CM

## 2024-05-14 DIAGNOSIS — E669 Obesity, unspecified: Secondary | ICD-10-CM

## 2024-05-14 DIAGNOSIS — O09292 Supervision of pregnancy with other poor reproductive or obstetric history, second trimester: Secondary | ICD-10-CM

## 2024-05-14 DIAGNOSIS — O10912 Unspecified pre-existing hypertension complicating pregnancy, second trimester: Secondary | ICD-10-CM

## 2024-05-14 DIAGNOSIS — O09892 Supervision of other high risk pregnancies, second trimester: Secondary | ICD-10-CM

## 2024-05-14 DIAGNOSIS — Z3A21 21 weeks gestation of pregnancy: Secondary | ICD-10-CM | POA: Insufficient documentation

## 2024-05-14 DIAGNOSIS — O0990 Supervision of high risk pregnancy, unspecified, unspecified trimester: Secondary | ICD-10-CM

## 2024-05-14 DIAGNOSIS — O34219 Maternal care for unspecified type scar from previous cesarean delivery: Secondary | ICD-10-CM

## 2024-05-14 DIAGNOSIS — Z148 Genetic carrier of other disease: Secondary | ICD-10-CM | POA: Diagnosis not present

## 2024-05-14 DIAGNOSIS — O10012 Pre-existing essential hypertension complicating pregnancy, second trimester: Secondary | ICD-10-CM | POA: Diagnosis not present

## 2024-05-14 DIAGNOSIS — Z363 Encounter for antenatal screening for malformations: Secondary | ICD-10-CM | POA: Diagnosis not present

## 2024-05-14 DIAGNOSIS — O09212 Supervision of pregnancy with history of pre-term labor, second trimester: Secondary | ICD-10-CM | POA: Insufficient documentation

## 2024-05-14 DIAGNOSIS — O358XX0 Maternal care for other (suspected) fetal abnormality and damage, not applicable or unspecified: Secondary | ICD-10-CM

## 2024-05-14 NOTE — Progress Notes (Signed)
 MFM Consult Note  Alexis Blanchard is currently at [redacted]w[redacted]d. She was seen today for a detailed fetal anatomy scan due to chronic hypertension treated with labetalol  100 mg twice a day and due to maternal obesity.    She also has a history of severe preeclampsia requiring a 34-week delivery.  She denies any problems in her current pregnancy.  Her blood pressure today was 133/72.  She had a cell free DNA test earlier in her pregnancy which indicated a low risk for trisomy 60, 59, and 13. A female fetus is predicted.   Sonographic findings Single intrauterine pregnancy at 21w 0d. Fetal cardiac activity:  Observed and appears normal. Presentation: Cephalic. The views of the fetal anatomy were limited today due to the fetal position.  The fetal cardiac views could not be fully visualized.  The rest of the fetal anatomy appeared within normal limits. Fetal biometry shows the estimated fetal weight of 1 lb, 454 grams (86%). Amniotic fluid: Within normal limits.  MVP: 4.61 cm. Placenta: Anterior. Adnexa: No abnormality visualized. Cervical length: 3.8 cm.  The patient was informed that anomalies may be missed due to technical limitations. If the fetus is in a suboptimal position or maternal habitus is increased, visualization of the fetus in the maternal uterus may be impaired.  Chronic Hypertension in Pregnancy  The implications and management of chronic hypertension in pregnancy was discussed.   She was advised to continue taking labetalol  as prescribed for the duration of her pregnancy.  The increased risk of superimposed preeclampsia, an indicated preterm delivery, and possible fetal growth restriction due to chronic hypertension in pregnancy was discussed.   We will continue to follow her with growth ultrasounds throughout her pregnancy.    Weekly fetal testing will be started at around 32 weeks and continued until delivery.  To decrease her risk of superimposed preeclampsia, she should  continue taking a daily baby aspirin  (81 mg daily) for preeclampsia prophylaxis.   A follow-up exam was scheduled in 5 weeks to complete the views of the fetal anatomy and to assess the fetal growth.  The patient stated that all of her questions were answered.   A total of 30 minutes was spent counseling and coordinating the care for this patient.  Greater than 50% of the time was spent in direct face-to-face contact.

## 2024-06-11 ENCOUNTER — Ambulatory Visit: Admitting: Obstetrics & Gynecology

## 2024-06-11 VITALS — BP 130/78 | HR 101 | Wt 184.0 lb

## 2024-06-11 DIAGNOSIS — O10912 Unspecified pre-existing hypertension complicating pregnancy, second trimester: Secondary | ICD-10-CM | POA: Diagnosis not present

## 2024-06-11 DIAGNOSIS — O0992 Supervision of high risk pregnancy, unspecified, second trimester: Secondary | ICD-10-CM

## 2024-06-11 DIAGNOSIS — O10911 Unspecified pre-existing hypertension complicating pregnancy, first trimester: Secondary | ICD-10-CM

## 2024-06-11 DIAGNOSIS — Z8759 Personal history of other complications of pregnancy, childbirth and the puerperium: Secondary | ICD-10-CM

## 2024-06-11 DIAGNOSIS — O34219 Maternal care for unspecified type scar from previous cesarean delivery: Secondary | ICD-10-CM

## 2024-06-11 DIAGNOSIS — O0990 Supervision of high risk pregnancy, unspecified, unspecified trimester: Secondary | ICD-10-CM

## 2024-06-11 DIAGNOSIS — Z3A25 25 weeks gestation of pregnancy: Secondary | ICD-10-CM | POA: Diagnosis not present

## 2024-06-11 NOTE — Patient Instructions (Signed)
 Oral Glucose Tolerance Test During Pregnancy Why am I having this test? The oral glucose tolerance test (GTT) is done to check how your body processes blood sugar (glucose). This is one of several tests used to diagnose diabetes that develops during pregnancy (gestational diabetes mellitus). Gestational diabetes is a short-term form of diabetes that some women develop while they are pregnant. It usually occurs during the second or third trimester of pregnancy and goes away after delivery. Testing, or screening, for gestational diabetes usually occurs around 69 of pregnancy. This test may also be needed earlier if: You have a history of gestational diabetes. There is a history of giving birth to very large babies or of losing pregnancies (having stillbirths). You have signs and symptoms of diabetes, such as: Changes in your eyesight. Tingling or numbness in your hands or feet. Changes in hunger, thirst, and urination, and these are not explained by your pregnancy. What is being tested? This test measures the amount of glucose in your blood at different times during a period of 2 hours. This shows how well your body can process glucose.  You will have three separate blood draws. What kind of sample is taken?  Blood samples are required for this test. They are usually collected by inserting a needle into a blood vessel. How do I prepare for this test? For 3 days before your test, eat normally. Have plenty of carbohydrate-rich foods. You will be asked not to eat or drink anything other than water (to fast) starting 8-10 hours before the test. Tell a health care provider about: All medicines you are taking, including vitamins, herbs, eye drops, creams, and over-the-counter medicines. Any blood disorders you have. Any surgeries you have had. Any medical conditions you have. What happens during the test? First, your blood glucose will be measured. This is referred to as your fasting blood glucose  because you fasted before the test. Then, you will drink a glucose solution that contains a certain amount of glucose. Your blood glucose will be measured again 1 and 2 hours after you drink the solution. This test takes about 2 hours to complete. You will need to stay at the testing location during this time. During the testing period: Do not eat or drink anything other than the glucose solution. Do not exercise. Do not use any products that contain nicotine or tobacco, such as cigarettes, e-cigarettes, and chewing tobacco. These can affect your test results. If you need help quitting, ask your health care provider. The testing procedure may vary among health care providers and hospitals. How are the results reported? Your results will be reported as milligrams of glucose per deciliter of blood (mg/dL) or millimoles per liter (mmol/L). There is more than one source for screening and diagnosis reference values used to diagnose gestational diabetes. Your health care provider will compare your results to normal values that were established after testing a large group of people (reference values). Reference values may vary among labs and hospitals. For this test, reference values are: Fasting: 92 mg/dL 1 hour: 161 mg/dL  2 hour: 096 mg/dL   What do the results mean? Results below the reference values are considered normal. If one or more of your blood glucose levels are at or above the reference values, you will be diagnosed with gestational diabetes.  Talk with your health care provider about what your results mean. Questions to ask your health care provider Ask your health care provider, or the department that is doing the test: When  will my results be ready? How will I get my results? What are my treatment options? What other tests do I need? What are my next steps? Summary The oral glucose tolerance test (GTT) is one of several tests used to diagnose diabetes that develops during pregnancy  (gestational diabetes mellitus). Gestational diabetes is a short-term form of diabetes that some women develop while they are pregnant. You may also have this test if you have any symptoms or risk factors for this type of diabetes. Talk with your health care provider about what your results mean. This information is not intended to replace advice given to you by your health care provider. Make sure you discuss any questions you have with your health care provider.  TDaP Vaccine Pregnancy Get the Whooping Cough Vaccine While You Are Pregnant (CDC)  It is important for women to get the whooping cough vaccine in the third trimester of each pregnancy. Vaccines are the best way to prevent this disease. There are 2 different whooping cough vaccines. Both vaccines combine protection against whooping cough, tetanus and diphtheria, but they are for different age groups: Tdap: for everyone 11 years or older, including pregnant women  DTaP: for children 2 months through 45 years of age  You need the whooping cough vaccine during each of your pregnancies The recommended time to get the shot is during your 27th through 36th week of pregnancy, preferably during the earlier part of this time period. The Centers for Disease Control and Prevention (CDC) recommends that pregnant women receive the whooping cough vaccine for adolescents and adults (called Tdap vaccine) during the third trimester of each pregnancy. The recommended time to get the shot is during your 27th through 36th week of pregnancy, preferably during the earlier part of this time period. This replaces the original recommendation that pregnant women get the vaccine only if they had not previously received it. The [employer] and the [employer] support this recommendation.  You should get the whooping cough vaccine while pregnant to pass protection to your baby frame support disabled  and/or not supported in this browser  Learn why Vernona Rieger decided to get the whooping cough vaccine in her 3rd trimester of pregnancy and how her baby girl was born with some protection against the disease. Also available on YouTube. After receiving the whooping cough vaccine, your body will create protective antibodies (proteins produced by the body to fight off diseases) and pass some of them to your baby before birth. These antibodies provide your baby some short-term protection against whooping cough in early life. These antibodies can also protect your baby from some of the more serious complications that come along with whooping cough. Your protective antibodies are at their highest about 2 weeks after getting the vaccine, but it takes time to pass them to your baby. So the preferred time to get the whooping cough vaccine is early in your third trimester. The amount of whooping cough antibodies in your body decreases over time. That is why CDC recommends you get a whooping cough vaccine during each pregnancy. Doing so allows each of your babies to get the greatest number of protective antibodies from you. This means each of your babies will get the best protection possible against this disease.  Getting the whooping cough vaccine while pregnant is better than getting the vaccine after you give birth Whooping cough vaccination during pregnancy is ideal so your baby will have short-term protection as soon as he  is born. This early protection is important because your baby will not start getting his whooping cough vaccines until he is 2 months old. These first few months of life are when your baby is at greatest risk for catching whooping cough. This is also when he's at greatest risk for having severe, potentially life-threating complications from the infection. To avoid that gap in protection, it is best to get a whooping cough vaccine during pregnancy. You will then pass protection to your baby before he  is born. To continue protecting your baby, he should get whooping cough vaccines starting at 2 months old. You may never have gotten the Tdap vaccine before and did not get it during this pregnancy. If so, you should make sure to get the vaccine immediately after you give birth, before leaving the hospital or birthing center. It will take about 2 weeks before your body develops protection (antibodies) in response to the vaccine. Once you have protection from the vaccine, you are less likely to give whooping cough to your newborn while caring for him. But remember, your baby will still be at risk for catching whooping cough from others. A recent study looked to see how effective Tdap was at preventing whooping cough in babies whose mothers got the vaccine while pregnant or in the hospital after giving birth. The study found that getting Tdap between 27 through 36 weeks of pregnancy is 85% more effective at preventing whooping cough in babies younger than 2 months old. Blood tests cannot tell if you need a whooping cough vaccine There are no blood tests that can tell you if you have enough antibodies in your body to protect yourself or your baby against whooping cough. Even if you have been sick with whooping cough in the past or previously received the vaccine, you still should get the vaccine during each pregnancy. Breastfeeding may pass some protective antibodies onto your baby By breastfeeding, you may pass some antibodies you have made in response to the vaccine to your baby. When you get a whooping cough vaccine during your pregnancy, you will have antibodies in your breast milk that you can share with your baby as soon as your milk comes in. However, your baby will not get protective antibodies immediately if you wait to get the whooping cough vaccine until after delivering your baby. This is because it takes about 2 weeks for your body to create antibodies. Learn more about the health benefits of  breastfeeding.

## 2024-06-11 NOTE — Progress Notes (Signed)
 Pt having ?round ligament pain, please discuss.

## 2024-06-18 ENCOUNTER — Ambulatory Visit

## 2024-07-02 ENCOUNTER — Encounter: Admitting: Family Medicine

## 2024-07-02 ENCOUNTER — Other Ambulatory Visit

## 2024-07-17 ENCOUNTER — Encounter: Admitting: Certified Nurse Midwife

## 2024-07-30 ENCOUNTER — Encounter: Admitting: Obstetrics & Gynecology

## 2024-08-13 ENCOUNTER — Encounter: Admitting: Family Medicine
